# Patient Record
Sex: Male | Born: 1950
Health system: Southern US, Community
[De-identification: ages and names within clinical notes are randomized; demographics above are authoritative.]

## PROBLEM LIST (undated history)

## (undated) DIAGNOSIS — E119 Type 2 diabetes mellitus without complications: Secondary | ICD-10-CM

## (undated) DIAGNOSIS — I10 Essential (primary) hypertension: Secondary | ICD-10-CM

## (undated) DIAGNOSIS — N400 Enlarged prostate without lower urinary tract symptoms: Secondary | ICD-10-CM

## (undated) DIAGNOSIS — M1990 Unspecified osteoarthritis, unspecified site: Secondary | ICD-10-CM

## (undated) DIAGNOSIS — S129XXA Fracture of neck, unspecified, initial encounter: Secondary | ICD-10-CM

## (undated) DIAGNOSIS — N200 Calculus of kidney: Secondary | ICD-10-CM

## (undated) DIAGNOSIS — J45909 Unspecified asthma, uncomplicated: Secondary | ICD-10-CM

## (undated) DIAGNOSIS — Z87442 Personal history of urinary calculi: Secondary | ICD-10-CM

## (undated) HISTORY — DX: Unspecified osteoarthritis, unspecified site: M19.90

## (undated) HISTORY — DX: Essential (primary) hypertension: I10

## (undated) HISTORY — PX: ROTATOR CUFF REPAIR: SHX139

## (undated) HISTORY — DX: Type 2 diabetes mellitus without complications: E11.9

## (undated) HISTORY — DX: Benign prostatic hyperplasia without lower urinary tract symptoms: N40.0

## (undated) HISTORY — DX: Unspecified asthma, uncomplicated: J45.909

## (undated) HISTORY — DX: Calculus of kidney: N20.0

---
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
## 1973-04-21 HISTORY — PX: KNEE ARTHROSCOPY: SHX127

## 1990-04-21 DIAGNOSIS — S129XXA Fracture of neck, unspecified, initial encounter: Secondary | ICD-10-CM

## 1990-04-21 HISTORY — DX: Fracture of neck, unspecified, initial encounter: S12.9XXA

## 1998-09-04 ENCOUNTER — Ambulatory Visit (HOSPITAL_BASED_OUTPATIENT_CLINIC_OR_DEPARTMENT_OTHER): Admission: RE | Admit: 1998-09-04 | Discharge: 1998-09-04 | Payer: Self-pay | Admitting: Orthopedic Surgery

## 1999-05-30 ENCOUNTER — Ambulatory Visit (HOSPITAL_COMMUNITY): Admission: RE | Admit: 1999-05-30 | Discharge: 1999-05-30 | Payer: Self-pay | Admitting: Neurosurgery

## 1999-05-30 ENCOUNTER — Encounter: Payer: Self-pay | Admitting: Neurosurgery

## 1999-08-29 ENCOUNTER — Encounter: Admission: RE | Admit: 1999-08-29 | Discharge: 1999-08-29 | Payer: Self-pay | Admitting: Neurosurgery

## 1999-08-29 ENCOUNTER — Encounter: Payer: Self-pay | Admitting: Neurosurgery

## 1999-12-04 ENCOUNTER — Encounter: Admission: RE | Admit: 1999-12-04 | Discharge: 1999-12-04 | Payer: Self-pay | Admitting: Neurosurgery

## 1999-12-04 ENCOUNTER — Encounter: Payer: Self-pay | Admitting: Neurosurgery

## 2016-07-08 ENCOUNTER — Encounter: Payer: Self-pay | Admitting: Emergency Medicine

## 2016-07-08 ENCOUNTER — Emergency Department
Admission: EM | Admit: 2016-07-08 | Discharge: 2016-07-08 | Disposition: A | Discharge status: Home / Self Care | Payer: BLUE CROSS/BLUE SHIELD | Attending: Emergency Medicine | Admitting: Emergency Medicine

## 2016-07-08 ENCOUNTER — Emergency Department: Payer: BLUE CROSS/BLUE SHIELD

## 2016-07-08 DIAGNOSIS — N23 Unspecified renal colic: Secondary | ICD-10-CM | POA: Diagnosis not present

## 2016-07-08 DIAGNOSIS — R911 Solitary pulmonary nodule: Secondary | ICD-10-CM | POA: Insufficient documentation

## 2016-07-08 DIAGNOSIS — R109 Unspecified abdominal pain: Secondary | ICD-10-CM

## 2016-07-08 DIAGNOSIS — N400 Enlarged prostate without lower urinary tract symptoms: Secondary | ICD-10-CM

## 2016-07-08 DIAGNOSIS — E279 Disorder of adrenal gland, unspecified: Secondary | ICD-10-CM | POA: Diagnosis not present

## 2016-07-08 DIAGNOSIS — E278 Other specified disorders of adrenal gland: Secondary | ICD-10-CM

## 2016-07-08 LAB — BASIC METABOLIC PANEL
Anion gap: 11 (ref 5–15)
BUN: 13 mg/dL (ref 6–20)
CO2: 23 mmol/L (ref 22–32)
Calcium: 9.3 mg/dL (ref 8.9–10.3)
Chloride: 97 mmol/L — ABNORMAL LOW (ref 101–111)
Creatinine, Ser: 1.04 mg/dL (ref 0.61–1.24)
GFR calc Af Amer: >60 mL/min (ref >60)
GFR calc non Af Amer: >60 mL/min (ref >60)
Glucose, Bld: 355 mg/dL — ABNORMAL HIGH (ref 65–99)
Potassium: 3.7 mmol/L (ref 3.5–5.1)
Sodium: 131 mmol/L — ABNORMAL LOW (ref 135–145)

## 2016-07-08 LAB — URINALYSIS, COMPLETE (UACMP) WITH MICROSCOPIC
Bacteria, UA: NONE SEEN
Bilirubin Urine: NEGATIVE
KETONES UR: 20 mg/dL — AB
Leukocytes, UA: NEGATIVE
Nitrite: NEGATIVE
PH: 6 (ref 5.0–8.0)
Protein, ur: NEGATIVE mg/dL
SPECIFIC GRAVITY, URINE: 1.015 (ref 1.005–1.030)
Squamous Epithelial / LPF: NONE SEEN

## 2016-07-08 LAB — CBC
HCT: 45.6 % (ref 40.0–52.0)
Hemoglobin: 16.4 g/dL (ref 13.0–18.0)
MCH: 30.3 pg (ref 26.0–34.0)
MCHC: 36.1 g/dL — ABNORMAL HIGH (ref 32.0–36.0)
MCV: 84 fL (ref 80.0–100.0)
Platelets: 209 10*3/uL (ref 150–440)
RBC: 5.42 MIL/uL (ref 4.40–5.90)
RDW: 12.5 % (ref 11.5–14.5)
WBC: 9.2 10*3/uL (ref 3.8–10.6)

## 2016-07-08 MED ORDER — ONDANSETRON HCL 4 MG/2ML IJ SOLN
INTRAMUSCULAR | Status: AC
  Filled 2016-07-08: qty 2

## 2016-07-08 MED ORDER — SODIUM CHLORIDE 0.9 % IV BOLUS (SEPSIS)
1000.0000 mL | Freq: Once | INTRAVENOUS | Status: AC
  Administered 2016-07-08: 1000 mL via INTRAVENOUS

## 2016-07-08 MED ORDER — KETOROLAC TROMETHAMINE 30 MG/ML IJ SOLN
30.0000 mg | Freq: Once | INTRAMUSCULAR | Status: AC
Start: 2016-07-08 — End: 2016-07-08
  Administered 2016-07-08: 30 mg via INTRAVENOUS

## 2016-07-08 MED ORDER — KETOROLAC TROMETHAMINE 30 MG/ML IJ SOLN
INTRAMUSCULAR | Status: AC
  Filled 2016-07-08: qty 1

## 2016-07-08 MED ORDER — KETOROLAC TROMETHAMINE 10 MG PO TABS: 10.0000 mg | ORAL_TABLET | Freq: Three times a day (TID) | ORAL | 0 refills | Status: DC | PRN

## 2016-07-08 MED ORDER — SODIUM CHLORIDE 0.9 % IV BOLUS (SEPSIS): 1000.0000 mL | Freq: Once | INTRAVENOUS | Status: DC

## 2016-07-08 MED ORDER — ONDANSETRON HCL 4 MG/2ML IJ SOLN
4.0000 mg | Freq: Once | INTRAMUSCULAR | Status: AC
  Administered 2016-07-08: 4 mg via INTRAVENOUS

## 2016-07-08 NOTE — Discharge Instructions (Signed)
Please return to the emergency department if you develop severe pain, fever, inability to keep down fluids, or any other symptoms concerning to you.  Please also make a follow-up appointment with your primary care physician for the abnormal findings on your lung, adrenal gland, and prostate in your CT scan.

## 2016-07-08 NOTE — ED Provider Notes (Addendum)
[facility] Emergency Department Provider Note  ____________________________________________  Time seen: Approximately 10:56 AM  I have reviewed the triage vital signs and the nursing notes.   HISTORY  Chief Complaint Flank Pain    HPI Jonathan Cervantes is a 66 y.o. male with a history of renal colic presenting with recently diagnosed 6 mm left-sided renal stone failing symptomatic management at home. The patient reports that he began to have left flank pain 9 days ago it resolved after passing 2 small stones 4 days later. He was traveling over the weekend and had acute onset of left flank pain, and presented to a local emergency department where he was redness with a 6 mm stone and discharged with Zofran, hydrocodone, and Flomax. Since then, he has had worsening pain, vomiting despite medications. No known passage of stone, hematuria, fever.   Past Medical History:  Diagnosis Date  . Renal disorder     There are no active problems to display for this patient.   History reviewed. No pertinent surgical history.    Allergies Eggs or egg-derived products and Morphine and related  History reviewed. No pertinent family history.  Social History Social History  Substance Use Topics  . Smoking status: Never Smoker  . Smokeless tobacco: Never Used  . Alcohol use No    Review of Systems Constitutional: No fever/chills.No lightheadedness or syncope. Eyes: No visual changes. ENT: No sore throat. No congestion or rhinorrhea. Cardiovascular: Denies chest pain. Denies palpitations. Respiratory: Denies shortness of breath.  No cough. Gastrointestinal: Positive left flank pain. No abdominal pain.  Positive nausea, positive vomiting.  No diarrhea.  No constipation. Genitourinary: Negative for dysuria. No testicular, scrotal or penile pain.  No hematuria. Musculoskeletal: Positive for left flank pain Skin: Negative for rash. Neurological: Negative for headaches.  No focal numbness, tingling or weakness.   10-point ROS otherwise negative.  ____________________________________________   PHYSICAL EXAM:  VITAL SIGNS: ED Triage Vitals [07/08/16 1026]  Enc Vitals Group     BP (!) 187/110     Pulse Rate (!) 107     Resp 16     Temp 97.6 F (36.4 C)     Temp Source Oral     SpO2 99 %     Weight 168 lb (76.2 kg)     Height 5\' 11"  (1.803 m)     Head Circumference      Peak Flow      Pain Score 10     Pain Loc      Pain Edu?      Excl. in [place]?     Constitutional: Alert and oriented. Very uncomfortable appearing with waves of increasing distress but nontoxic. Answers questions appropriately. Eyes: Conjunctivae are normal.  EOMI. No scleral icterus. Head: Atraumatic. Nose: No congestion/rhinnorhea. Mouth/Throat: Mucous membranes are moist.  Neck: No stridor.  Supple.   Cardiovascular: Normal rate, regular rhythm. No murmurs, rubs or gallops.  Respiratory: Normal respiratory effort.  No accessory muscle use or retractions. Lungs CTAB.  No wheezes, rales or ronchi. Gastrointestinal: Positive tenderness to palpation in the left CVA. Soft, nontender and nondistended.  No guarding or rebound.  No peritoneal signs. Musculoskeletal: No LE edema. Neurologic:  A&Ox3.  Speech is clear.  Face and smile are symmetric.  EOMI.  Moves all extremities well. Skin:  Skin is warm, dry and intact. No rash noted. Psychiatric: Mood and affect are normal. Speech and behavior are normal.  Normal judgement.  ____________________________________________   LABS (all labs  ordered are listed, but only abnormal results are displayed)  Labs Reviewed  CBC - Abnormal; Notable for the following:       Result Value   MCHC 36.1 (*)    All other components within normal limits  BASIC METABOLIC PANEL - Abnormal; Notable for the following:    Sodium 131 (*)    Chloride 97 (*)    Glucose, Bld 355 (*)    All other components within normal limits  URINALYSIS, COMPLETE  (UACMP) WITH MICROSCOPIC - Abnormal; Notable for the following:    Color, Urine YELLOW (*)    APPearance CLEAR (*)    Glucose, UA >=500 (*)    Hgb urine dipstick MODERATE (*)    Ketones, ur 20 (*)    All other components within normal limits   ____________________________________________  EKG  Not indicated ____________________________________________  RADIOLOGY  Ct Renal Stone Study  Result Date: 07/08/2016 CLINICAL DATA:  Left flank pain for 1 week. Reported passage of renal stones. EXAM: CT ABDOMEN AND PELVIS WITHOUT CONTRAST TECHNIQUE: Multidetector CT imaging of the abdomen and pelvis was performed following the standard protocol without IV contrast. COMPARISON:  None. FINDINGS: Lower chest: Irregular 1.7 x 1.3 cm solid pulmonary nodule in the medial right lower lobe (series 4/image 12). Hepatobiliary: Diffuse hepatic steatosis. No liver mass. No definite morphologic changes of cirrhosis. Cholelithiasis. No gallbladder wall thickening or pericholecystic fluid. No biliary ductal dilatation. Pancreas: Normal, with no mass or duct dilation. Spleen: Normal size. No mass. Adrenals/Urinary Tract: Indeterminate 1.1 cm right adrenal nodule with density 30 HU (series 2/ image 25). No left adrenal nodules. Obstructing 7 mm left pelvic ureteral stone located approximately 6-7 cm above the left ureterovesical junction, with mild left hydroureteronephrosis. Additional punctate 1 mm nonobstructing lower left renal stone. No right renal stones. No right hydronephrosis. Normal caliber right ureter, with no right ureteral stones. No contour deforming renal mass. No bladder stones or definite bladder wall thickening. Stomach/Bowel: Grossly normal stomach. Normal caliber small bowel with no small bowel wall thickening. Normal appendix. Normal large bowel with no diverticulosis, large bowel wall thickening or pericolonic fat stranding. Vascular/Lymphatic: Atherosclerotic nonaneurysmal abdominal aorta. No  pathologically enlarged lymph nodes in the abdomen or pelvis. Reproductive: Markedly enlarged prostate. Prostate dimensions 6.6 x 5.4 x 6.5 cm (volume = 120 cm^3). Mass-effect on the bladder base by the enlarged median lobe of the prostate. Other: No pneumoperitoneum, ascites or focal fluid collection. Musculoskeletal: No aggressive appearing focal osseous lesions. Bilateral L5 pars defects with mild degenerative disc disease and minimal 3 mm anterolisthesis at L5-S1. IMPRESSION: 1. Obstructing 7 mm left pelvic ureteral stone with mild left hydroureteronephrosis. Additional punctate nonobstructing lower left renal stone. Markedly enlarged prostate. 2. Irregular solid 1.7 cm medial right lower lobe pulmonary nodule, lung malignancy not excluded. Consider one of the following in 3 months for both low-risk and high-risk individuals: (a) repeat chest CT, (b) follow-up PET-CT, or (c) tissue sampling. This recommendation follows the consensus statement: Guidelines for Management of Incidental Pulmonary Nodules Detected on CT Images: From the Fleischner Society 2017; Radiology 2017; 284:228-243. 3. Indeterminate 1.1 cm right adrenal nodule, which could also be assessed at PET-CT if performed for the right lower lobe pulmonary nodule. MRI abdomen without and with IV contrast is also an option for further evaluation of the right adrenal nodule. 4. Additional findings include aortic atherosclerosis, diffuse hepatic steatosis, cholelithiasis and bilateral L5 pars defects. Electronically Signed   By: Ilona Sorrel M.D.   On: 07/08/2016 11:45  ____________________________________________   PROCEDURES  Procedure(s) performed: None  Procedures  Critical Care performed: No ____________________________________________   INITIAL IMPRESSION / ASSESSMENT AND PLAN / ED COURSE  Pertinent labs & imaging results that were available during my care of the patient were reviewed by me and considered in my medical decision  making (see chart for details).  66 y.o. male with a history of renal colic and known 6 mm left-sided stone presenting with acute worsening of pain associated with nausea and vomiting despite attempted medications at home. Overall, the most likely etiology of the patient's symptoms is progressive movement of the 6 m stone. We'll get a repeat CT renal stone to evaluate his stone in the position of his stone. I'll also check his creatinine and look for signs of infection. He'll be treated symptomatically immediately and given intravenous fluids. Other causes for his pain are also possible but less likely. Plan reevaluation for final disposition.  ----------------------------------------- 12:07 PM on 07/08/2016 -----------------------------------------  At this time, the patient's symptoms have completely resolved. He is no longer nauseated, and his pain has completely resolved with a single dose of Toradol. His creatinine is normal, he does not have an elevation in his white blood cell count, and his urinalysis does not show infection. He does have a 7 mm stone that is still 6-7 cm above the UVJ. I'll plan to talk to urology for final disposition. Also where of the lung and adrenal masses that were found incidentally on his CAT scan as well as the large prostate, and will follow-up with his primary care physician.  ----------------------------------------- 12:37 PM on 07/08/2016 -----------------------------------------  I've spoken with Dr. Junious Silk, the urologist on-call, who recommends close outpatient follow-up. His office will call the patient to be seen this week. At this time, the patient will be discharged without any symptoms, and prescriptions for continued symptomatic treatment. Return precautions and follow-up instructions were discussed. ____________________________________________  FINAL CLINICAL IMPRESSION(S) / ED DIAGNOSES  Final diagnoses:  Flank pain  Enlarged prostate  Renal  colic on left side  Pulmonary nodule, right  Right adrenal mass ([place])         NEW MEDICATIONS STARTED DURING THIS VISIT:  New Prescriptions   No medications on file      Eula Listen, MD 07/08/16 1236    Eula Listen, MD 07/08/16 1237

## 2016-07-08 NOTE — ED Triage Notes (Signed)
Pt to ed with c/o left flank pain intermittently over the past week.  Hx of kidney stones.

## 2016-07-09 ENCOUNTER — Other Ambulatory Visit: Payer: Self-pay

## 2016-07-09 ENCOUNTER — Telehealth: Payer: Self-pay | Admitting: Urology

## 2016-07-09 DIAGNOSIS — N200 Calculus of kidney: Secondary | ICD-10-CM

## 2016-07-09 NOTE — Telephone Encounter (Signed)
-----   Message from Festus Aloe, MD sent at 07/08/2016  8:05 PM EDT ----- ED called to get f/u for pt - 7 mm left ureteral stone - see PA or MD with [facility] in 2-3 days

## 2016-07-09 NOTE — Telephone Encounter (Signed)
done

## 2016-07-10 ENCOUNTER — Encounter: Payer: Self-pay | Admitting: Urology

## 2016-07-10 ENCOUNTER — Ambulatory Visit (INDEPENDENT_AMBULATORY_CARE_PROVIDER_SITE_OTHER): Payer: BLUE CROSS/BLUE SHIELD | Admitting: Urology

## 2016-07-10 ENCOUNTER — Ambulatory Visit
Admission: RE | Admit: 2016-07-10 | Discharge: 2016-07-10 | Disposition: A | Discharge status: Home / Self Care | Payer: BLUE CROSS/BLUE SHIELD | Source: Ambulatory Visit | Attending: Urology | Admitting: Urology

## 2016-07-10 VITALS — BP 161/94 | HR 97 | Ht 71.0 in | Wt 179.8 lb

## 2016-07-10 DIAGNOSIS — R935 Abnormal findings on diagnostic imaging of other abdominal regions, including retroperitoneum: Secondary | ICD-10-CM | POA: Insufficient documentation

## 2016-07-10 DIAGNOSIS — N200 Calculus of kidney: Secondary | ICD-10-CM | POA: Diagnosis not present

## 2016-07-10 LAB — URINALYSIS, COMPLETE
Bilirubin, UA: NEGATIVE
Ketones, UA: NEGATIVE
Leukocytes, UA: NEGATIVE
NITRITE UA: NEGATIVE
PH UA: 6.5 (ref 5.0–7.5)
Protein, UA: NEGATIVE
Specific Gravity, UA: 1.01 (ref 1.005–1.030)
UUROB: 0.2 mg/dL (ref 0.2–1.0)

## 2016-07-10 LAB — MICROSCOPIC EXAMINATION
Bacteria, UA: NONE SEEN
Epithelial Cells (non renal): NONE SEEN /hpf (ref 0–10)
WBC UA: NONE SEEN /HPF (ref 0–?)

## 2016-07-10 MED ORDER — OXYCODONE-ACETAMINOPHEN 5-325 MG PO TABS: 1.0000 | ORAL_TABLET | ORAL | 0 refills | Status: DC | PRN

## 2016-07-10 NOTE — Progress Notes (Signed)
07/10/2016 4:21 PM   Jonathan Cervantes [birth year] [id number]  Referring provider: No referring provider defined for this encounter.  Chief Complaint  Patient presents with  . Nephrolithiasis    severe left side flank pain     HPI: The patient is a 66 year old gentleman with a past medical history of nephrolithiasis who presents after being diagnosed in the emergency department with a left distal 7 mm ureteral calculus.  The patient has been to the ER twice for this. Is currently in a tremendous amount of pain. He is on Toradol every 8 hours. He is also on Flomax and Zofran. He has not passed the stone. He has passed 5 previous stones all spontaneously. One was 9 mm. He has never had surgery for stones. He forgets what type the stone he has intrinsic composition. He denies fevers or chills at this time. He does have dry heaving. CT showed stone in the distal left ureter. It punctate stones on the left as well. Nonobstructing.   PMH: Past Medical History:  Diagnosis Date  . Renal disorder     Surgical History: No past surgical history on file.  Home Medications:  Allergies as of 07/10/2016      Reactions   Eggs Or Egg-derived Products Anaphylaxis   Morphine And Related Anaphylaxis      Medication List       Accurate as of 07/10/16  4:21 PM. Always use your most recent med list.          HYDROcodone-acetaminophen 5-325 MG tablet Commonly known as:  NORCO/VICODIN Take 1 tablet by mouth every 6 (six) hours as needed.   ketorolac 10 MG tablet Commonly known as:  TORADOL Take 1 tablet (10 mg total) by mouth every 8 (eight) hours as needed for moderate pain (with food).   ondansetron 4 MG disintegrating tablet Commonly known as:  ZOFRAN-ODT Take 1 tablet by mouth every 6 (six) hours as needed.   oxyCODONE-acetaminophen 5-325 MG tablet Commonly known as:  ROXICET Take 1-2 tablets by mouth every 4 (four) hours as needed for severe pain.   tamsulosin 0.4 MG Caps  capsule Commonly known as:  FLOMAX Take 1 capsule by mouth daily.       Allergies:  Allergies  Allergen Reactions  . Eggs Or Egg-Derived Products Anaphylaxis  . Morphine And Related Anaphylaxis    Family History: No family history on file.  Social History:  reports that he has never smoked. He has never used smokeless tobacco. He reports that he does not drink alcohol or use drugs.  ROS: UROLOGY Frequent Urination?: No Hard to postpone urination?: No Burning/pain with urination?: No Get up at night to urinate?: Yes Leakage of urine?: No Urine stream starts and stops?: No Trouble starting stream?: Yes Do you have to strain to urinate?: No Blood in urine?: No Urinary tract infection?: No Sexually transmitted disease?: No Injury to kidneys or bladder?: No Painful intercourse?: No Weak stream?: Yes Erection problems?: Yes Penile pain?: No  Gastrointestinal Nausea?: Yes Vomiting?: Yes Indigestion/heartburn?: No Diarrhea?: No Constipation?: Yes  Constitutional Fever: No Night sweats?: No Weight loss?: No Fatigue?: Yes  Skin Skin rash/lesions?: No Itching?: No  Eyes Blurred vision?: No Double vision?: No  Ears/Nose/Throat Sore throat?: No Sinus problems?: No  Hematologic/Lymphatic Swollen glands?: No Easy bruising?: No  Cardiovascular Leg swelling?: No Chest pain?: No  Respiratory Cough?: No Shortness of breath?: No  Endocrine Excessive thirst?: No  Musculoskeletal Back pain?: Yes Joint pain?: No  Neurological Headaches?:  No Dizziness?: No  Psychologic Depression?: No Anxiety?: No  Physical Exam: BP (!) 161/94   Pulse 97   Ht 5\' 11"  (1.803 m)   Wt 179 lb 12.8 oz (81.6 kg)   BMI 25.08 kg/m   Constitutional:  Alert and oriented, No acute distress. HEENT: [place] AT, moist mucus membranes.  Trachea midline, no masses. Cardiovascular: No clubbing, cyanosis, or edema. Respiratory: Normal respiratory effort, no increased work of  breathing. GI: Abdomen is soft, nontender, nondistended, no abdominal masses GU: Left CVA tenderness.  Skin: No rashes, bruises or suspicious lesions. Lymph: No cervical or inguinal adenopathy. Neurologic: Grossly intact, no focal deficits, moving all 4 extremities. Psychiatric: Normal mood and affect.  Laboratory Data: Lab Results  Component Value Date   WBC 9.2 07/08/2016   HGB 16.4 07/08/2016   HCT 45.6 07/08/2016   MCV 84.0 07/08/2016   PLT 209 07/08/2016    Lab Results  Component Value Date   CREATININE 1.04 07/08/2016    No results found for: PSA  No results found for: TESTOSTERONE  No results found for: HGBA1C  Urinalysis    Component Value Date/Time   COLORURINE YELLOW (A) 07/08/2016 1133   APPEARANCEUR CLEAR (A) 07/08/2016 1133   LABSPEC 1.015 07/08/2016 1133   PHURINE 6.0 07/08/2016 1133   GLUCOSEU >=500 (A) 07/08/2016 1133   HGBUR MODERATE (A) 07/08/2016 1133   BILIRUBINUR NEGATIVE 07/08/2016 1133   KETONESUR 20 (A) 07/08/2016 1133   PROTEINUR NEGATIVE 07/08/2016 1133   NITRITE NEGATIVE 07/08/2016 1133   LEUKOCYTESUR NEGATIVE 07/08/2016 1133    Pertinent Imaging: Reviewed as above.  Assessment & Plan:    1. Left ureteral stone The patient is quite uncomfortable at this time. He will continue his Toradol. I have also given him a prescription for Percocet 1-2 pills every 4 hours. We will have him scheduled tomorrow for cystoscopy, left ureteroscopy, laser lithotripsy, and stent placement. He understands the risks, benefits, and indications of this procedure. He understands the risks include but are not limited to bleeding, infection, iatrogenic injury, need for repeat procedures, need for ureteral stent, and postoperative stent discomfort. All questions were answered. We will have the patient scheduled for tomorrow morning. If his pain becomes uncontrolled and intolerable overnight, he will plan to present to the emergency room for IV pain control until the  scheduled surgery in the morning.  Nickie Retort, MD  [facility] [address] [place], [place] [address] [phone]

## 2016-07-11 ENCOUNTER — Ambulatory Visit: Payer: BLUE CROSS/BLUE SHIELD | Admitting: Anesthesiology

## 2016-07-11 ENCOUNTER — Encounter
Admission: RE | Disposition: A | Discharge status: Home / Self Care | Payer: Self-pay | Source: Ambulatory Visit | Attending: Urology

## 2016-07-11 ENCOUNTER — Ambulatory Visit
Admission: RE | Admit: 2016-07-11 | Discharge: 2016-07-11 | Disposition: A | Discharge status: Home / Self Care | Payer: BLUE CROSS/BLUE SHIELD | Source: Ambulatory Visit | Attending: Urology | Admitting: Urology

## 2016-07-11 ENCOUNTER — Encounter: Payer: Self-pay | Admitting: *Deleted

## 2016-07-11 DIAGNOSIS — N200 Calculus of kidney: Secondary | ICD-10-CM

## 2016-07-11 DIAGNOSIS — N132 Hydronephrosis with renal and ureteral calculous obstruction: Secondary | ICD-10-CM | POA: Diagnosis not present

## 2016-07-11 DIAGNOSIS — N138 Other obstructive and reflux uropathy: Secondary | ICD-10-CM | POA: Insufficient documentation

## 2016-07-11 DIAGNOSIS — N201 Calculus of ureter: Secondary | ICD-10-CM | POA: Diagnosis not present

## 2016-07-11 HISTORY — PX: CYSTOSCOPY WITH STENT PLACEMENT: SHX5790

## 2016-07-11 HISTORY — PX: URETEROSCOPY WITH HOLMIUM LASER LITHOTRIPSY: SHX6645

## 2016-07-11 HISTORY — DX: Fracture of neck, unspecified, initial encounter: S12.9XXA

## 2016-07-11 LAB — GLUCOSE, CAPILLARY
Glucose-Capillary: 272 mg/dL — ABNORMAL HIGH (ref 65–99)
Glucose-Capillary: 214 mg/dL — ABNORMAL HIGH (ref 65–99)

## 2016-07-11 SURGERY — URETEROSCOPY, WITH LITHOTRIPSY USING HOLMIUM LASER
Anesthesia: General | Site: Ureter | Laterality: Left | Wound class: Clean Contaminated

## 2016-07-11 MED ORDER — FENTANYL CITRATE (PF) 100 MCG/2ML IJ SOLN
INTRAMUSCULAR | Status: AC
  Filled 2016-07-11: qty 2

## 2016-07-11 MED ORDER — GLYCOPYRROLATE 0.2 MG/ML IJ SOLN
INTRAMUSCULAR | Status: DC | PRN
  Administered 2016-07-11: 0.2 mg via INTRAVENOUS

## 2016-07-11 MED ORDER — MIDAZOLAM HCL 2 MG/2ML IJ SOLN
INTRAMUSCULAR | Status: AC
  Filled 2016-07-11: qty 2

## 2016-07-11 MED ORDER — DEXAMETHASONE SODIUM PHOSPHATE 10 MG/ML IJ SOLN
INTRAMUSCULAR | Status: AC
  Filled 2016-07-11: qty 1

## 2016-07-11 MED ORDER — SODIUM CHLORIDE 0.9 % IV SOLN
INTRAVENOUS | Status: DC
  Administered 2016-07-11: 100 mL/h via INTRAVENOUS

## 2016-07-11 MED ORDER — CEFAZOLIN SODIUM-DEXTROSE 2-4 GM/100ML-% IV SOLN
2.0000 g | Freq: Once | INTRAVENOUS | Status: AC
  Administered 2016-07-11: 2 g via INTRAVENOUS

## 2016-07-11 MED ORDER — ONDANSETRON HCL 4 MG/2ML IJ SOLN
4.0000 mg | Freq: Once | INTRAMUSCULAR | Status: AC | PRN
  Administered 2016-07-11: 4 mg via INTRAVENOUS

## 2016-07-11 MED ORDER — PROPOFOL 10 MG/ML IV BOLUS
INTRAVENOUS | Status: AC
  Filled 2016-07-11: qty 20

## 2016-07-11 MED ORDER — FENTANYL CITRATE (PF) 100 MCG/2ML IJ SOLN
25.0000 ug | INTRAMUSCULAR | Status: AC | PRN
  Administered 2016-07-11 (×6): 25 ug via INTRAVENOUS

## 2016-07-11 MED ORDER — ONDANSETRON HCL 4 MG/2ML IJ SOLN
INTRAMUSCULAR | Status: AC
  Filled 2016-07-11: qty 2

## 2016-07-11 MED ORDER — LIDOCAINE HCL (CARDIAC) 20 MG/ML IV SOLN
INTRAVENOUS | Status: DC | PRN
  Administered 2016-07-11: 50 mg via INTRAVENOUS

## 2016-07-11 MED ORDER — FAMOTIDINE 20 MG PO TABS
ORAL_TABLET | ORAL | Status: AC
  Administered 2016-07-11: 20 mg via ORAL
  Filled 2016-07-11: qty 1

## 2016-07-11 MED ORDER — LIDOCAINE HCL (PF) 2 % IJ SOLN
INTRAMUSCULAR | Status: AC
  Filled 2016-07-11: qty 2

## 2016-07-11 MED ORDER — CEPHALEXIN 500 MG PO CAPS: 500.0000 mg | ORAL_CAPSULE | Freq: Three times a day (TID) | ORAL | 0 refills | Status: DC

## 2016-07-11 MED ORDER — FENTANYL CITRATE (PF) 100 MCG/2ML IJ SOLN
INTRAMUSCULAR | Status: AC
  Administered 2016-07-11: 25 ug via INTRAVENOUS
  Filled 2016-07-11: qty 2

## 2016-07-11 MED ORDER — GLYCOPYRROLATE 0.2 MG/ML IJ SOLN
INTRAMUSCULAR | Status: AC
  Filled 2016-07-11: qty 1

## 2016-07-11 MED ORDER — DEXAMETHASONE SODIUM PHOSPHATE 10 MG/ML IJ SOLN
INTRAMUSCULAR | Status: DC | PRN
  Administered 2016-07-11: 5 mg via INTRAVENOUS

## 2016-07-11 MED ORDER — SODIUM CHLORIDE 0.9 % IJ SOLN
INTRAMUSCULAR | Status: AC
  Filled 2016-07-11: qty 30

## 2016-07-11 MED ORDER — MIDAZOLAM HCL 2 MG/2ML IJ SOLN
INTRAMUSCULAR | Status: DC | PRN
  Administered 2016-07-11: 2 mg via INTRAVENOUS

## 2016-07-11 MED ORDER — FAMOTIDINE 20 MG PO TABS
20.0000 mg | ORAL_TABLET | Freq: Once | ORAL | Status: AC
  Administered 2016-07-11: 20 mg via ORAL

## 2016-07-11 MED ORDER — ONDANSETRON HCL 4 MG/2ML IJ SOLN
INTRAMUSCULAR | Status: AC
  Administered 2016-07-11: 4 mg via INTRAVENOUS
  Filled 2016-07-11: qty 2

## 2016-07-11 MED ORDER — ONDANSETRON HCL 4 MG/2ML IJ SOLN
INTRAMUSCULAR | Status: DC | PRN
  Administered 2016-07-11: 4 mg via INTRAVENOUS

## 2016-07-11 MED ORDER — PROPOFOL 10 MG/ML IV BOLUS
INTRAVENOUS | Status: DC | PRN
  Administered 2016-07-11: 200 mg via INTRAVENOUS

## 2016-07-11 MED ORDER — FENTANYL CITRATE (PF) 100 MCG/2ML IJ SOLN
INTRAMUSCULAR | Status: DC | PRN
  Administered 2016-07-11 (×2): 50 ug via INTRAVENOUS
  Administered 2016-07-11 (×2): 25 ug via INTRAVENOUS
  Administered 2016-07-11: 50 ug via INTRAVENOUS

## 2016-07-11 MED ORDER — IOTHALAMATE MEGLUMINE 43 % IV SOLN
INTRAVENOUS | Status: DC | PRN
  Administered 2016-07-11: 15 mL

## 2016-07-11 SURGICAL SUPPLY — 35 items
BACTOSHIELD CHG 4% 4OZ (MISCELLANEOUS) ×2
BAG URINE DRAINAGE (UROLOGICAL SUPPLIES) ×2 IMPLANT
BASKET ZERO TIP 1.9FR (BASKET) ×2 IMPLANT
BSKT STON RTRVL ZERO TP 1.9FR (BASKET) ×1
CATH FOL 2WAY LX 20X30 (CATHETERS) ×2 IMPLANT
CATH URETL 5X70 OPEN END (CATHETERS) ×3 IMPLANT
CNTNR SPEC 2.5X3XGRAD LEK (MISCELLANEOUS) ×1
CONT SPEC 4OZ STER OR WHT (MISCELLANEOUS) ×2
CONT SPEC 4OZ STRL OR WHT (MISCELLANEOUS) ×1
CONTAINER SPEC 2.5X3XGRAD LEK (MISCELLANEOUS) ×1 IMPLANT
GLOVE BIO SURGEON STRL SZ7 (GLOVE) ×3 IMPLANT
GLOVE BIO SURGEON STRL SZ7.5 (GLOVE) ×3 IMPLANT
GOWN STRL REUS W/ TWL LRG LVL4 (GOWN DISPOSABLE) ×1 IMPLANT
GOWN STRL REUS W/TWL LRG LVL4 (GOWN DISPOSABLE) ×3
GOWN STRL REUS W/TWL XL LVL3 (GOWN DISPOSABLE) ×3 IMPLANT
GUIDEWIRE SUPER STIFF (WIRE) IMPLANT
HOLDER FOLEY CATH W/STRAP (MISCELLANEOUS) ×2 IMPLANT
INTRODUCER DILATOR DOUBLE (INTRODUCER) ×3 IMPLANT
KIT RM TURNOVER CYSTO AR (KITS) ×3 IMPLANT
LASER HOLMIUM FIBER SU 365UM (MISCELLANEOUS) ×2 IMPLANT
PACK CYSTO AR (MISCELLANEOUS) ×3 IMPLANT
SCRUB CHG 4% DYNA-HEX 4OZ (MISCELLANEOUS) ×1 IMPLANT
SENSORWIRE 0.038 NOT ANGLED (WIRE) ×6
SET CYSTO W/LG BORE CLAMP LF (SET/KITS/TRAYS/PACK) ×3 IMPLANT
SHEATH URETERAL 13/15X36 1L (SHEATH) ×2 IMPLANT
SOL .9 NS 3000ML IRR  AL (IV SOLUTION) ×2
SOL .9 NS 3000ML IRR AL (IV SOLUTION) ×1
SOL .9 NS 3000ML IRR UROMATIC (IV SOLUTION) ×1 IMPLANT
STENT URET 6FRX24 CONTOUR (STENTS) IMPLANT
STENT URET 6FRX26 CONTOUR (STENTS) ×2 IMPLANT
SURGILUBE 2OZ TUBE FLIPTOP (MISCELLANEOUS) ×3 IMPLANT
SYR 10ML LL (SYRINGE) ×2 IMPLANT
SYRINGE IRR TOOMEY STRL 70CC (SYRINGE) ×3 IMPLANT
WATER STERILE IRR 1000ML POUR (IV SOLUTION) ×3 IMPLANT
WIRE SENSOR 0.038 NOT ANGLED (WIRE) ×2 IMPLANT

## 2016-07-11 NOTE — Anesthesia Preprocedure Evaluation (Signed)
Anesthesia Evaluation  Patient identified by MRN, date of birth, ID band Patient awake    Reviewed: Allergy & Precautions, NPO status , Patient's Chart, lab work & pertinent test results  History of Anesthesia Complications Negative for: history of anesthetic complications  Airway Mallampati: I       Dental   Pulmonary neg pulmonary ROS,           Cardiovascular negative cardio ROS       Neuro/Psych negative neurological ROS     GI/Hepatic negative GI ROS, Neg liver ROS,   Endo/Other  negative endocrine ROS  Renal/GU Renal disease (stones)     Musculoskeletal   Abdominal   Peds  Hematology negative hematology ROS (+)   Anesthesia Other Findings   Reproductive/Obstetrics                             Anesthesia Physical Anesthesia Plan  ASA: II  Anesthesia Plan: General   Post-op Pain Management:    Induction: Intravenous  Airway Management Planned: LMA  Additional Equipment:   Intra-op Plan:   Post-operative Plan:   Informed Consent: I have reviewed the patients History and Physical, chart, labs and discussed the procedure including the risks, benefits and alternatives for the proposed anesthesia with the patient or authorized representative who has indicated his/her understanding and acceptance.     Plan Discussed with:   Anesthesia Plan Comments:         Anesthesia Quick Evaluation

## 2016-07-11 NOTE — Anesthesia Postprocedure Evaluation (Signed)
Anesthesia Post Note  Patient: Jonathan Cervantes  Procedure(s) Performed: Procedure(s) (LRB): URETEROSCOPY WITH HOLMIUM LASER LITHOTRIPSY (Left) CYSTOSCOPY WITH STENT PLACEMENT (Left)  Patient location during evaluation: PACU Anesthesia Type: General Level of consciousness: awake and alert Pain management: pain level controlled Vital Signs Assessment: post-procedure vital signs reviewed and stable Respiratory status: spontaneous breathing and respiratory function stable Cardiovascular status: stable Anesthetic complications: no     Last Vitals:  Vitals:   07/11/16 1259 07/11/16 1504  BP: (!) 157/77 114/65  Pulse: 82 84  Resp: 15 10  Temp:  36.6 C    Last Pain:  Vitals:   07/11/16 1259  TempSrc:   PainSc: 2                  Mitali Shenefield K

## 2016-07-11 NOTE — OR Nursing (Signed)
Discussed with Dr. Ronelle Nigh the patients blood sugar of 276 and his elevated blood pressures.  Patient is not considered a diabetic and does not receive treatment for hypertension since he has had weight loss.  Dr. Ronelle Nigh does not want to treat either blood sugar or blood pressure at this time but will continue to monitor post operatively.

## 2016-07-11 NOTE — Interval H&P Note (Signed)
History and Physical Interval Note:  07/11/2016 10:28 AM  Jonathan Cervantes  has presented today for surgery, with the diagnosis of LEFT URETERAL STONE  The various methods of treatment have been discussed with the patient and family. After consideration of risks, benefits and other options for treatment, the patient has consented to  Procedure(s): URETEROSCOPY WITH HOLMIUM LASER LITHOTRIPSY (Left) CYSTOSCOPY WITH STENT PLACEMENT (Left) as a surgical intervention .  The patient's history has been reviewed, patient examined, no change in status, stable for surgery.  I have reviewed the patient's chart and labs.  Questions were answered to the patient's satisfaction.    RRR Lungs clear  Nickie Retort

## 2016-07-11 NOTE — Anesthesia Procedure Notes (Signed)
Procedure Name: LMA Insertion Performed by: Arinze Rivadeneira Pre-anesthesia Checklist: Patient identified, Patient being monitored, Timeout performed, Emergency Drugs available and Suction available Patient Re-evaluated:Patient Re-evaluated prior to inductionOxygen Delivery Method: Circle system utilized Preoxygenation: Pre-oxygenation with 100% oxygen Intubation Type: IV induction Ventilation: Mask ventilation without difficulty LMA: LMA inserted LMA Size: 4.0 Tube type: Oral Number of attempts: 1 Placement Confirmation: positive ETCO2 and breath sounds checked- equal and bilateral Tube secured with: Tape Dental Injury: Teeth and Oropharynx as per pre-operative assessment      

## 2016-07-11 NOTE — Transfer of Care (Signed)
Immediate Anesthesia Transfer of Care Note  Patient: Jonathan Cervantes  Procedure(s) Performed: Procedure(s): URETEROSCOPY WITH HOLMIUM LASER LITHOTRIPSY (Left) CYSTOSCOPY WITH STENT PLACEMENT (Left)  Patient Location: PACU  Anesthesia Type:General  Level of Consciousness: sedated  Airway & Oxygen Therapy: Patient Spontanous Breathing and Patient connected to face mask oxygen  Post-op Assessment: Report given to RN and Post -op Vital signs reviewed and stable  Post vital signs: Reviewed  Last Vitals:  Vitals:   07/11/16 1259 07/11/16 1504  BP: (!) 157/77 114/65  Pulse: 82 84  Resp: 15 10  Temp:      Last Pain:  Vitals:   07/11/16 1259  TempSrc:   PainSc: 2       Patients Stated Pain Goal: 1 (76/22/63 3354)  Complications: No apparent anesthesia complications

## 2016-07-11 NOTE — Discharge Instructions (Signed)

## 2016-07-11 NOTE — Anesthesia Post-op Follow-up Note (Cosign Needed)
Anesthesia QCDR form completed.        

## 2016-07-11 NOTE — H&P (View-Only) (Signed)
07/10/2016 4:21 PM   Jonathan Cervantes June 11, 1950 [id number]  Referring provider: No referring provider defined for this encounter.  Chief Complaint  Patient presents with  . Nephrolithiasis    severe left side flank pain     HPI: The patient is a 66 year old gentleman with a past medical history of nephrolithiasis who presents after being diagnosed in the emergency department with a left distal 7 mm ureteral calculus.  The patient has been to the ER twice for this. Is currently in a tremendous amount of pain. He is on Toradol every 8 hours. He is also on Flomax and Zofran. He has not passed the stone. He has passed 5 previous stones all spontaneously. One was 9 mm. He has never had surgery for stones. He forgets what type the stone he has intrinsic composition. He denies fevers or chills at this time. He does have dry heaving. CT showed stone in the distal left ureter. It punctate stones on the left as well. Nonobstructing.   PMH: Past Medical History:  Diagnosis Date  . Renal disorder     Surgical History: No past surgical history on file.  Home Medications:  Allergies as of 07/10/2016      Reactions   Eggs Or Egg-derived Products Anaphylaxis   Morphine And Related Anaphylaxis      Medication List       Accurate as of 07/10/16  4:21 PM. Always use your most recent med list.          HYDROcodone-acetaminophen 5-325 MG tablet Commonly known as:  NORCO/VICODIN Take 1 tablet by mouth every 6 (six) hours as needed.   ketorolac 10 MG tablet Commonly known as:  TORADOL Take 1 tablet (10 mg total) by mouth every 8 (eight) hours as needed for moderate pain (with food).   ondansetron 4 MG disintegrating tablet Commonly known as:  ZOFRAN-ODT Take 1 tablet by mouth every 6 (six) hours as needed.   oxyCODONE-acetaminophen 5-325 MG tablet Commonly known as:  ROXICET Take 1-2 tablets by mouth every 4 (four) hours as needed for severe pain.   tamsulosin 0.4 MG Caps  capsule Commonly known as:  FLOMAX Take 1 capsule by mouth daily.       Allergies:  Allergies  Allergen Reactions  . Eggs Or Egg-Derived Products Anaphylaxis  . Morphine And Related Anaphylaxis    Family History: No family history on file.  Social History:  reports that he has never smoked. He has never used smokeless tobacco. He reports that he does not drink alcohol or use drugs.  ROS: UROLOGY Frequent Urination?: No Hard to postpone urination?: No Burning/pain with urination?: No Get up at night to urinate?: Yes Leakage of urine?: No Urine stream starts and stops?: No Trouble starting stream?: Yes Do you have to strain to urinate?: No Blood in urine?: No Urinary tract infection?: No Sexually transmitted disease?: No Injury to kidneys or bladder?: No Painful intercourse?: No Weak stream?: Yes Erection problems?: Yes Penile pain?: No  Gastrointestinal Nausea?: Yes Vomiting?: Yes Indigestion/heartburn?: No Diarrhea?: No Constipation?: Yes  Constitutional Fever: No Night sweats?: No Weight loss?: No Fatigue?: Yes  Skin Skin rash/lesions?: No Itching?: No  Eyes Blurred vision?: No Double vision?: No  Ears/Nose/Throat Sore throat?: No Sinus problems?: No  Hematologic/Lymphatic Swollen glands?: No Easy bruising?: No  Cardiovascular Leg swelling?: No Chest pain?: No  Respiratory Cough?: No Shortness of breath?: No  Endocrine Excessive thirst?: No  Musculoskeletal Back pain?: Yes Joint pain?: No  Neurological Headaches?:  No Dizziness?: No  Psychologic Depression?: No Anxiety?: No  Physical Exam: BP (!) 161/94   Pulse 97   Ht 5\' 11"  (1.803 m)   Wt 179 lb 12.8 oz (81.6 kg)   BMI 25.08 kg/m   Constitutional:  Alert and oriented, No acute distress. HEENT: [name], moist mucus membranes.  Trachea midline, no masses. Cardiovascular: No clubbing, cyanosis, or edema. Respiratory: Normal respiratory effort, no increased work of  breathing. GI: Abdomen is soft, nontender, nondistended, no abdominal masses GU: Left CVA tenderness.  Skin: No rashes, bruises or suspicious lesions. Lymph: No cervical or inguinal adenopathy. Neurologic: Grossly intact, no focal deficits, moving all 4 extremities. Psychiatric: Normal mood and affect.  Laboratory Data: Lab Results  Component Value Date   WBC 9.2 07/08/2016   HGB 16.4 07/08/2016   HCT 45.6 07/08/2016   MCV 84.0 07/08/2016   PLT 209 07/08/2016    Lab Results  Component Value Date   CREATININE 1.04 07/08/2016    No results found for: PSA  No results found for: TESTOSTERONE  No results found for: HGBA1C  Urinalysis    Component Value Date/Time   COLORURINE YELLOW (A) 07/08/2016 1133   APPEARANCEUR CLEAR (A) 07/08/2016 1133   LABSPEC 1.015 07/08/2016 1133   PHURINE 6.0 07/08/2016 1133   GLUCOSEU >=500 (A) 07/08/2016 1133   HGBUR MODERATE (A) 07/08/2016 1133   BILIRUBINUR NEGATIVE 07/08/2016 1133   KETONESUR 20 (A) 07/08/2016 1133   PROTEINUR NEGATIVE 07/08/2016 1133   NITRITE NEGATIVE 07/08/2016 1133   LEUKOCYTESUR NEGATIVE 07/08/2016 1133    Pertinent Imaging: Reviewed as above.  Assessment & Plan:    1. Left ureteral stone The patient is quite uncomfortable at this time. He will continue his Toradol. I have also given him a prescription for Percocet 1-2 pills every 4 hours. We will have him scheduled tomorrow for cystoscopy, left ureteroscopy, laser lithotripsy, and stent placement. He understands the risks, benefits, and indications of this procedure. He understands the risks include but are not limited to bleeding, infection, iatrogenic injury, need for repeat procedures, need for ureteral stent, and postoperative stent discomfort. All questions were answered. We will have the patient scheduled for tomorrow morning. If his pain becomes uncontrolled and intolerable overnight, he will plan to present to the emergency room for IV pain control until the  scheduled surgery in the morning.  Nickie Retort, MD  [facility] [address], [place], [place] [address] [phone]

## 2016-07-11 NOTE — Op Note (Signed)
Date of procedure: 07/11/16  Preoperative diagnosis:  1. Left ureteral stone   Postoperative diagnosis:  1. Left ureteral stone   Procedure: 1. Cystoscopy 2. Left ureteroscopy 3. Laser lithotripsy 4. Stone basketing 5. Left retrograde pyelogram with interpretation 6. Left ureteral stent placement 6 French by 26 cm  Surgeon: Baruch Gouty, MD  Anesthesia: General  Complications: None  Intraoperative findings:  the patient a left ureteral stone that was broken a small pieces and removed in its entirety. Left retrograde program at the end of the procedure showed no residual filling defects. The patient also had significant visual obstruction with his prostate as well as a large median lobe.  EBL:  none  Specimens:  left ureteral stone to pathology  Drains:  6 French by 26 over left double-J ureteral stent and 20 French Foley catheter (Foley catheter will be removed prior to discharge home from the PACU)  Disposition: Stable to the postanesthesia care unit  Indication for procedure: The patient is a 66 y.o. male with a left ureteral stone that is 7 mm in size and has failed medical expulsive therapy.  After reviewing the management options for treatment, the patient elected to proceed with the above surgical procedure(s). We have discussed the potential benefits and risks of the procedure, side effects of the proposed treatment, the likelihood of the patient achieving the goals of the procedure, and any potential problems that might occur during the procedure or recuperation. Informed consent has been obtained.  Description of procedure: The patient was met in the preoperative area. All risks, benefits, and indications of the procedure were described in great detail. The patient consented to the procedure. Preoperative antibiotics were given. The patient was taken to the operative theater. General anesthesia was induced per the anesthesia service. The patient was then placed in the  dorsal lithotomy position and prepped and draped in the usual sterile fashion. A preoperative timeout was called.   A 21 French 30 cystoscope was inserted in the patient's bladder atraumatically per urethra. The patient was noted have a large obstructive prostate with a large median lobe. It was difficult to find his left ear orifice due to his median lobe and it was eventually located. There was minor oozing from the median lobe due to manipulation. A sensor wire was then placed up to the level of the left renal pelvis under fluoroscopy a semirigid ureteroscope was exchanged for cystoscope and inserted in the patient's left ureter. The stone was visualized in the mid to proximal ureter. It is broken into fragments. Unfortunately these fragments due to extensive hydronephrosis will up into the renal pelvis. The fragment in the ureter was removed the stone basket and sent to pathology. A second sensor wire was then placed into the left collecting system under fluoroscopy. Attempt was made to pass a flexible ureteroscope over this however his distal ureter was too tortuous and would not easily navigated past this. However, was able to pass a ureteral access sheath atraumatically into the proximal ureter. The remaining stone was then found in the right upper pole and removed. Pan nephroscopy revealed no further stones. The ureteral access sheath was removed under direct visualization to no further stones or second trauma. Left retropyelogram showed no residual filling defects. Over the remaining sensor wire with the aid of the cystoscope was 6 [place] by 26 over double-J ureteral stent was placed. A curl seen in the patient's left renal pelvis on fluoroscopy and the urinary bladder under direct visualization. At this point  it was again noted that the patient had oozing of varicoses and small veins on his median lobe due to manipulation. Decision was made to place a Foley catheter to Have this for one hour  postoperatively. This was placed. He was woke from anesthesia and transferred in stable condition to the post anesthesia care unit.  PlaThe patient will follow-up in one week for left ureteral stent removal  He'll need a renal OR sound in 1 month after that time to rule out hydronephrosis . Baruch Gouty, M.D.

## 2016-07-12 ENCOUNTER — Encounter: Payer: Self-pay | Admitting: Urology

## 2016-07-15 ENCOUNTER — Emergency Department: Payer: BLUE CROSS/BLUE SHIELD

## 2016-07-15 ENCOUNTER — Encounter: Payer: Self-pay | Admitting: *Deleted

## 2016-07-15 ENCOUNTER — Emergency Department
Admission: EM | Admit: 2016-07-15 | Discharge: 2016-07-15 | Disposition: A | Discharge status: Home / Self Care | Payer: BLUE CROSS/BLUE SHIELD | Attending: Emergency Medicine | Admitting: Emergency Medicine

## 2016-07-15 DIAGNOSIS — R1032 Left lower quadrant pain: Secondary | ICD-10-CM | POA: Diagnosis not present

## 2016-07-15 DIAGNOSIS — R300 Dysuria: Secondary | ICD-10-CM | POA: Diagnosis not present

## 2016-07-15 DIAGNOSIS — R319 Hematuria, unspecified: Secondary | ICD-10-CM | POA: Insufficient documentation

## 2016-07-15 DIAGNOSIS — Z79899 Other long term (current) drug therapy: Secondary | ICD-10-CM | POA: Insufficient documentation

## 2016-07-15 DIAGNOSIS — R52 Pain, unspecified: Secondary | ICD-10-CM

## 2016-07-15 LAB — CBC
HEMATOCRIT: 42.5 % (ref 40.0–52.0)
Hemoglobin: 15.1 g/dL (ref 13.0–18.0)
MCH: 30.2 pg (ref 26.0–34.0)
MCHC: 35.5 g/dL (ref 32.0–36.0)
MCV: 85 fL (ref 80.0–100.0)
PLATELETS: 259 10*3/uL (ref 150–440)
RBC: 5 MIL/uL (ref 4.40–5.90)
RDW: 12.6 % (ref 11.5–14.5)
WBC: 7.4 10*3/uL (ref 3.8–10.6)

## 2016-07-15 LAB — URINALYSIS, COMPLETE (UACMP) WITH MICROSCOPIC
BILIRUBIN URINE: NEGATIVE
Ketones, ur: 5 mg/dL — AB
NITRITE: NEGATIVE
PH: 5 (ref 5.0–8.0)
PROTEIN: 100 mg/dL — AB
Specific Gravity, Urine: 1.028 (ref 1.005–1.030)
Squamous Epithelial / LPF: NONE SEEN

## 2016-07-15 LAB — BASIC METABOLIC PANEL
Anion gap: 8 (ref 5–15)
BUN: 15 mg/dL (ref 6–20)
CHLORIDE: 105 mmol/L (ref 101–111)
CO2: 23 mmol/L (ref 22–32)
CREATININE: 0.77 mg/dL (ref 0.61–1.24)
Calcium: 9.5 mg/dL (ref 8.9–10.3)
Glucose, Bld: 330 mg/dL — ABNORMAL HIGH (ref 65–99)
POTASSIUM: 3.6 mmol/L (ref 3.5–5.1)
SODIUM: 136 mmol/L (ref 135–145)

## 2016-07-15 MED ORDER — KETOROLAC TROMETHAMINE 30 MG/ML IJ SOLN
30.0000 mg | Freq: Once | INTRAMUSCULAR | Status: AC
  Administered 2016-07-15: 30 mg via INTRAVENOUS

## 2016-07-15 MED ORDER — OXYCODONE-ACETAMINOPHEN 5-325 MG PO TABS: 1.0000 | ORAL_TABLET | ORAL | 0 refills | Status: DC | PRN

## 2016-07-15 MED ORDER — SODIUM CHLORIDE 0.9 % IV BOLUS (SEPSIS)
1000.0000 mL | Freq: Once | INTRAVENOUS | Status: AC
  Administered 2016-07-15: 1000 mL via INTRAVENOUS

## 2016-07-15 MED ORDER — ONDANSETRON HCL 4 MG/2ML IJ SOLN
INTRAMUSCULAR | Status: AC
  Administered 2016-07-15: 4 mg via INTRAVENOUS
  Filled 2016-07-15: qty 2

## 2016-07-15 MED ORDER — KETOROLAC TROMETHAMINE 30 MG/ML IJ SOLN
INTRAMUSCULAR | Status: AC
  Administered 2016-07-15: 30 mg via INTRAVENOUS
  Filled 2016-07-15: qty 1

## 2016-07-15 MED ORDER — ONDANSETRON HCL 4 MG/2ML IJ SOLN
4.0000 mg | Freq: Once | INTRAMUSCULAR | Status: AC
  Administered 2016-07-15: 4 mg via INTRAVENOUS
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
## 2016-07-15 NOTE — Discharge Instructions (Signed)
Please continue drinking plenty of fluids. Continue to use the Flomax. Take the Percocet 1 or 2 pills 4 times a day as needed. Return for worse pain or bleeding follow-up with your allergist as planned.

## 2016-07-15 NOTE — ED Triage Notes (Signed)
Pt to triage via wheelchair. Pt reports passing clots of blood in urine.  Pt has kidney surgery last week at [facility].  Pt has left flank pain.  Pt has nausea.  Pt alert.

## 2016-07-17 ENCOUNTER — Ambulatory Visit (INDEPENDENT_AMBULATORY_CARE_PROVIDER_SITE_OTHER): Payer: BLUE CROSS/BLUE SHIELD | Admitting: Urology

## 2016-07-17 ENCOUNTER — Encounter: Payer: Self-pay | Admitting: Urology

## 2016-07-17 VITALS — BP 158/72 | HR 106 | Ht 71.0 in | Wt 175.5 lb

## 2016-07-17 DIAGNOSIS — N200 Calculus of kidney: Secondary | ICD-10-CM

## 2016-07-17 LAB — URINALYSIS, COMPLETE
Bilirubin, UA: NEGATIVE
Ketones, UA: NEGATIVE
NITRITE UA: NEGATIVE
PH UA: 5.5 (ref 5.0–7.5)
Specific Gravity, UA: 1.02 (ref 1.005–1.030)
UUROB: 0.2 mg/dL (ref 0.2–1.0)

## 2016-07-17 LAB — MICROSCOPIC EXAMINATION

## 2016-07-17 MED ORDER — CIPROFLOXACIN HCL 500 MG PO TABS
500.0000 mg | ORAL_TABLET | Freq: Once | ORAL | Status: AC
  Administered 2016-07-17: 500 mg via ORAL

## 2016-07-17 MED ORDER — LIDOCAINE HCL 2 % EX GEL
1.0000 "application " | Freq: Once | CUTANEOUS | Status: AC
  Administered 2016-07-17: 1 via URETHRAL

## 2016-07-17 NOTE — Progress Notes (Signed)
   07/17/16  CC:  Chief Complaint  Patient presents with  . Cysto Stent Removal    HPI:  There were no vitals taken for this visit. NED. A&Ox3.   No respiratory distress   Abd soft, NT, ND Normal phallus with bilateral descended testicles  Cystoscopy Procedure Note  Patient identification was confirmed, informed consent was obtained, and patient was prepped using Betadine solution.  Lidocaine jelly was administered per urethral meatus.    Preoperative abx where received prior to procedure.     Pre-Procedure: - Inspection reveals a normal caliber ureteral meatus.  Procedure: The flexible cystoscope was introduced without difficulty - No urethral strictures/lesions are present. - The patient's bladder neck and median lobe were very friable making visualization difficult. Furthermore due to the large median lobe, the left ureteral stent was difficult to visualize as it was mostly posterior to it. After approximately 10 minutes, the procedure was aborted due to friable tissue obstructing the vision, the patient's inability to continue to tolerate the procedure, and the inability to grasp the left ureteral stent.   Post-Procedure: - Patient tolerated the procedure well  Assessment/ Plan:  1. Left ureteral stone -We'll plan for cystoscopy with left ureteral stent removal in the OR due to the patient's complex anatomy and friable prostate tissue.  2. BPH -Significant visual obstruction was noted with large median lobe during recent cystoscopy. Will discuss patient's symptoms in greater detail at his follow-up appointment.

## 2016-07-21 ENCOUNTER — Telehealth: Payer: Self-pay | Admitting: Radiology

## 2016-07-21 ENCOUNTER — Other Ambulatory Visit: Payer: Self-pay | Admitting: Radiology

## 2016-07-21 DIAGNOSIS — Z960 Presence of urogenital implants: Secondary | ICD-10-CM

## 2016-07-21 DIAGNOSIS — N201 Calculus of ureter: Secondary | ICD-10-CM

## 2016-07-21 LAB — CULTURE, URINE COMPREHENSIVE

## 2016-07-21 NOTE — Telephone Encounter (Signed)
Notified pt of surgery date & arrival time to SDS. Advised pt to hold toradol, NSAIDS, ASA products beginning now & to arrive npo after mn. Pt voices understanding & had no questions.

## 2016-07-23 ENCOUNTER — Other Ambulatory Visit: Payer: Self-pay | Admitting: Radiology

## 2016-07-23 LAB — STONE ANALYSIS
Ca Oxalate,Monohydr.: 97 %
Ca phos cry stone ql IR: 3 %
Stone Weight KSTONE: 73.5 mg

## 2016-07-24 ENCOUNTER — Encounter
Admission: RE | Disposition: A | Discharge status: Home / Self Care | Payer: Self-pay | Source: Ambulatory Visit | Attending: Urology

## 2016-07-24 ENCOUNTER — Ambulatory Visit: Payer: BLUE CROSS/BLUE SHIELD | Admitting: Certified Registered Nurse Anesthetist

## 2016-07-24 ENCOUNTER — Encounter: Payer: Self-pay | Admitting: *Deleted

## 2016-07-24 ENCOUNTER — Ambulatory Visit
Admission: RE | Admit: 2016-07-24 | Discharge: 2016-07-24 | Disposition: A | Discharge status: Home / Self Care | Payer: BLUE CROSS/BLUE SHIELD | Source: Ambulatory Visit | Attending: Urology | Admitting: Urology

## 2016-07-24 DIAGNOSIS — I10 Essential (primary) hypertension: Secondary | ICD-10-CM | POA: Insufficient documentation

## 2016-07-24 DIAGNOSIS — Z885 Allergy status to narcotic agent status: Secondary | ICD-10-CM | POA: Diagnosis not present

## 2016-07-24 DIAGNOSIS — Z91012 Allergy to eggs: Secondary | ICD-10-CM | POA: Diagnosis not present

## 2016-07-24 DIAGNOSIS — Z960 Presence of urogenital implants: Secondary | ICD-10-CM

## 2016-07-24 DIAGNOSIS — Z466 Encounter for fitting and adjustment of urinary device: Secondary | ICD-10-CM | POA: Diagnosis not present

## 2016-07-24 DIAGNOSIS — N200 Calculus of kidney: Secondary | ICD-10-CM | POA: Diagnosis present

## 2016-07-24 HISTORY — PX: CYSTOSCOPY W/ URETERAL STENT REMOVAL: SHX1430

## 2016-07-24 LAB — GLUCOSE, CAPILLARY
GLUCOSE-CAPILLARY: 276 mg/dL — AB (ref 65–99)
Glucose-Capillary: 263 mg/dL — ABNORMAL HIGH (ref 65–99)

## 2016-07-24 SURGERY — REMOVAL, STENT, URETER, CYSTOSCOPIC
Anesthesia: General | Laterality: Left

## 2016-07-24 MED ORDER — PROPOFOL 500 MG/50ML IV EMUL
INTRAVENOUS | Status: AC
  Filled 2016-07-24: qty 50

## 2016-07-24 MED ORDER — FENTANYL CITRATE (PF) 100 MCG/2ML IJ SOLN: 25.0000 ug | INTRAMUSCULAR | Status: DC | PRN

## 2016-07-24 MED ORDER — FENTANYL CITRATE (PF) 100 MCG/2ML IJ SOLN
INTRAMUSCULAR | Status: DC | PRN
  Administered 2016-07-24 (×2): 50 ug via INTRAVENOUS

## 2016-07-24 MED ORDER — DEXAMETHASONE SODIUM PHOSPHATE 10 MG/ML IJ SOLN
INTRAMUSCULAR | Status: AC
  Filled 2016-07-24: qty 1

## 2016-07-24 MED ORDER — LIDOCAINE HCL (PF) 2 % IJ SOLN
INTRAMUSCULAR | Status: AC
  Filled 2016-07-24: qty 2

## 2016-07-24 MED ORDER — FAMOTIDINE 20 MG PO TABS
ORAL_TABLET | ORAL | Status: AC
  Administered 2016-07-24: 20 mg via ORAL
  Filled 2016-07-24: qty 1

## 2016-07-24 MED ORDER — GLYCOPYRROLATE 0.2 MG/ML IJ SOLN
INTRAMUSCULAR | Status: AC
  Filled 2016-07-24: qty 1

## 2016-07-24 MED ORDER — PROPOFOL 10 MG/ML IV BOLUS
INTRAVENOUS | Status: DC | PRN
  Administered 2016-07-24 (×2): 40 mg via INTRAVENOUS
  Administered 2016-07-24: 30 mg via INTRAVENOUS
  Administered 2016-07-24 (×2): 10 mg via INTRAVENOUS
  Administered 2016-07-24: 20 mg via INTRAVENOUS

## 2016-07-24 MED ORDER — CEFAZOLIN SODIUM-DEXTROSE 2-4 GM/100ML-% IV SOLN
INTRAVENOUS | Status: AC
  Administered 2016-07-24: 2 g via INTRAVENOUS
  Filled 2016-07-24: qty 100

## 2016-07-24 MED ORDER — FAMOTIDINE 20 MG PO TABS
20.0000 mg | ORAL_TABLET | Freq: Once | ORAL | Status: AC
  Administered 2016-07-24: 20 mg via ORAL

## 2016-07-24 MED ORDER — ONDANSETRON HCL 4 MG/2ML IJ SOLN: 4.0000 mg | Freq: Once | INTRAMUSCULAR | Status: DC | PRN

## 2016-07-24 MED ORDER — CEFAZOLIN SODIUM-DEXTROSE 2-4 GM/100ML-% IV SOLN
2.0000 g | INTRAVENOUS | Status: AC
  Administered 2016-07-24: 2 g via INTRAVENOUS

## 2016-07-24 MED ORDER — LIDOCAINE HCL (CARDIAC) 20 MG/ML IV SOLN
INTRAVENOUS | Status: DC | PRN
  Administered 2016-07-24: 50 mg via INTRAVENOUS

## 2016-07-24 MED ORDER — FENTANYL CITRATE (PF) 100 MCG/2ML IJ SOLN
INTRAMUSCULAR | Status: AC
  Filled 2016-07-24: qty 2

## 2016-07-24 MED ORDER — GLYCOPYRROLATE 0.2 MG/ML IJ SOLN
INTRAMUSCULAR | Status: DC | PRN
  Administered 2016-07-24: 0.2 mg via INTRAVENOUS

## 2016-07-24 MED ORDER — ONDANSETRON HCL 4 MG/2ML IJ SOLN
INTRAMUSCULAR | Status: AC
  Filled 2016-07-24: qty 2

## 2016-07-24 MED ORDER — SODIUM CHLORIDE 0.9 % IV SOLN
INTRAVENOUS | Status: DC
  Administered 2016-07-24: 09:00:00 via INTRAVENOUS

## 2016-07-24 SURGICAL SUPPLY — 18 items
BACTOSHIELD CHG 4% 4OZ (MISCELLANEOUS) ×2
GLOVE BIO SURGEON STRL SZ7.5 (GLOVE) ×3 IMPLANT
GOWN STRL REUS W/ TWL LRG LVL4 (GOWN DISPOSABLE) ×1 IMPLANT
GOWN STRL REUS W/TWL LRG LVL4 (GOWN DISPOSABLE) ×3
GOWN STRL REUS W/TWL XL LVL3 (GOWN DISPOSABLE) ×3 IMPLANT
KIT RM TURNOVER CYSTO AR (KITS) ×3 IMPLANT
PACK CYSTO AR (MISCELLANEOUS) ×3 IMPLANT
SCRUB CHG 4% DYNA-HEX 4OZ (MISCELLANEOUS) ×1 IMPLANT
SENSORWIRE 0.038 NOT ANGLED (WIRE) ×3
SET CYSTO W/LG BORE CLAMP LF (SET/KITS/TRAYS/PACK) ×3 IMPLANT
SOL .9 NS 3000ML IRR  AL (IV SOLUTION) ×2
SOL .9 NS 3000ML IRR AL (IV SOLUTION) ×1
SOL .9 NS 3000ML IRR UROMATIC (IV SOLUTION) ×1 IMPLANT
STENT URET 6FRX24 CONTOUR (STENTS) IMPLANT
STENT URET 6FRX26 CONTOUR (STENTS) IMPLANT
SURGILUBE 2OZ TUBE FLIPTOP (MISCELLANEOUS) ×3 IMPLANT
WATER STERILE IRR 1000ML POUR (IV SOLUTION) ×3 IMPLANT
WIRE SENSOR 0.038 NOT ANGLED (WIRE) ×1 IMPLANT

## 2016-07-24 NOTE — Anesthesia Post-op Follow-up Note (Cosign Needed)
Anesthesia QCDR form completed.        

## 2016-07-24 NOTE — Anesthesia Procedure Notes (Signed)
Date/Time: 07/24/2016 9:52 AM Performed by: Johnna Acosta Pre-anesthesia Checklist: Emergency Drugs available, Patient identified, Suction available, Patient being monitored and Timeout performed Patient Re-evaluated:Patient Re-evaluated prior to inductionOxygen Delivery Method: Simple face mask

## 2016-07-24 NOTE — Anesthesia Postprocedure Evaluation (Signed)
Anesthesia Post Note  Patient: Jonathan Cervantes  Procedure(s) Performed: Procedure(s) (LRB): CYSTOSCOPY WITH STENT REMOVAL (Left)  Patient location during evaluation: PACU Anesthesia Type: General Level of consciousness: awake and alert Pain management: pain level controlled Vital Signs Assessment: post-procedure vital signs reviewed and stable Respiratory status: spontaneous breathing, nonlabored ventilation, respiratory function stable and patient connected to nasal cannula oxygen Cardiovascular status: blood pressure returned to baseline and stable Postop Assessment: no signs of nausea or vomiting Anesthetic complications: no     Last Vitals:  Vitals:   07/24/16 1055 07/24/16 1107  BP: (!) 155/89 (!) 151/80  Pulse: 70   Resp: 16   Temp: 36.6 C     Last Pain:  Vitals:   07/24/16 1055  TempSrc:   PainSc: 0-No pain                 Charvi Gammage S

## 2016-07-24 NOTE — H&P (View-Only) (Signed)
07/10/2016 4:21 PM   Jonathan Cervantes [birth year] [id number]  Referring provider: No referring provider defined for this encounter.  Chief Complaint  Patient presents with  . Nephrolithiasis    severe left side flank pain     HPI: The patient is a 66 year old gentleman with a past medical history of nephrolithiasis who presents after being diagnosed in the emergency department with a left distal 7 mm ureteral calculus.  The patient has been to the ER twice for this. Is currently in a tremendous amount of pain. He is on Toradol every 8 hours. He is also on Flomax and Zofran. He has not passed the stone. He has passed 5 previous stones all spontaneously. One was 9 mm. He has never had surgery for stones. He forgets what type the stone he has intrinsic composition. He denies fevers or chills at this time. He does have dry heaving. CT showed stone in the distal left ureter. It punctate stones on the left as well. Nonobstructing.   PMH: Past Medical History:  Diagnosis Date  . Renal disorder     Surgical History: No past surgical history on file.  Home Medications:  Allergies as of 07/10/2016      Reactions   Eggs Or Egg-derived Products Anaphylaxis   Morphine And Related Anaphylaxis      Medication List       Accurate as of 07/10/16  4:21 PM. Always use your most recent med list.          HYDROcodone-acetaminophen 5-325 MG tablet Commonly known as:  NORCO/VICODIN Take 1 tablet by mouth every 6 (six) hours as needed.   ketorolac 10 MG tablet Commonly known as:  TORADOL Take 1 tablet (10 mg total) by mouth every 8 (eight) hours as needed for moderate pain (with food).   ondansetron 4 MG disintegrating tablet Commonly known as:  ZOFRAN-ODT Take 1 tablet by mouth every 6 (six) hours as needed.   oxyCODONE-acetaminophen 5-325 MG tablet Commonly known as:  ROXICET Take 1-2 tablets by mouth every 4 (four) hours as needed for severe pain.   tamsulosin 0.4 MG Caps  capsule Commonly known as:  FLOMAX Take 1 capsule by mouth daily.       Allergies:  Allergies  Allergen Reactions  . Eggs Or Egg-Derived Products Anaphylaxis  . Morphine And Related Anaphylaxis    Family History: No family history on file.  Social History:  reports that he has never smoked. He has never used smokeless tobacco. He reports that he does not drink alcohol or use drugs.  ROS: UROLOGY Frequent Urination?: No Hard to postpone urination?: No Burning/pain with urination?: No Get up at night to urinate?: Yes Leakage of urine?: No Urine stream starts and stops?: No Trouble starting stream?: Yes Do you have to strain to urinate?: No Blood in urine?: No Urinary tract infection?: No Sexually transmitted disease?: No Injury to kidneys or bladder?: No Painful intercourse?: No Weak stream?: Yes Erection problems?: Yes Penile pain?: No  Gastrointestinal Nausea?: Yes Vomiting?: Yes Indigestion/heartburn?: No Diarrhea?: No Constipation?: Yes  Constitutional Fever: No Night sweats?: No Weight loss?: No Fatigue?: Yes  Skin Skin rash/lesions?: No Itching?: No  Eyes Blurred vision?: No Double vision?: No  Ears/Nose/Throat Sore throat?: No Sinus problems?: No  Hematologic/Lymphatic Swollen glands?: No Easy bruising?: No  Cardiovascular Leg swelling?: No Chest pain?: No  Respiratory Cough?: No Shortness of breath?: No  Endocrine Excessive thirst?: No  Musculoskeletal Back pain?: Yes Joint pain?: No  Neurological Headaches?:  No Dizziness?: No  Psychologic Depression?: No Anxiety?: No  Physical Exam: BP (!) 161/94   Pulse 97   Ht 5\' 11"  (1.803 m)   Wt 179 lb 12.8 oz (81.6 kg)   BMI 25.08 kg/m   Constitutional:  Alert and oriented, No acute distress. HEENT: [place] AT, moist mucus membranes.  Trachea midline, no masses. Cardiovascular: No clubbing, cyanosis, or edema. Respiratory: Normal respiratory effort, no increased work of  breathing. GI: Abdomen is soft, nontender, nondistended, no abdominal masses GU: Left CVA tenderness.  Skin: No rashes, bruises or suspicious lesions. Lymph: No cervical or inguinal adenopathy. Neurologic: Grossly intact, no focal deficits, moving all 4 extremities. Psychiatric: Normal mood and affect.  Laboratory Data: Lab Results  Component Value Date   WBC 9.2 07/08/2016   HGB 16.4 07/08/2016   HCT 45.6 07/08/2016   MCV 84.0 07/08/2016   PLT 209 07/08/2016    Lab Results  Component Value Date   CREATININE 1.04 07/08/2016    No results found for: PSA  No results found for: TESTOSTERONE  No results found for: HGBA1C  Urinalysis    Component Value Date/Time   COLORURINE YELLOW (A) 07/08/2016 1133   APPEARANCEUR CLEAR (A) 07/08/2016 1133   LABSPEC 1.015 07/08/2016 1133   PHURINE 6.0 07/08/2016 1133   GLUCOSEU >=500 (A) 07/08/2016 1133   HGBUR MODERATE (A) 07/08/2016 1133   BILIRUBINUR NEGATIVE 07/08/2016 1133   KETONESUR 20 (A) 07/08/2016 1133   PROTEINUR NEGATIVE 07/08/2016 1133   NITRITE NEGATIVE 07/08/2016 1133   LEUKOCYTESUR NEGATIVE 07/08/2016 1133    Pertinent Imaging: Reviewed as above.  Assessment & Plan:    1. Left ureteral stone The patient is quite uncomfortable at this time. He will continue his Toradol. I have also given him a prescription for Percocet 1-2 pills every 4 hours. We will have him scheduled tomorrow for cystoscopy, left ureteroscopy, laser lithotripsy, and stent placement. He understands the risks, benefits, and indications of this procedure. He understands the risks include but are not limited to bleeding, infection, iatrogenic injury, need for repeat procedures, need for ureteral stent, and postoperative stent discomfort. All questions were answered. We will have the patient scheduled for tomorrow morning. If his pain becomes uncontrolled and intolerable overnight, he will plan to present to the emergency room for IV pain control until the  scheduled surgery in the morning.  Nickie Retort, MD  [facility] [address], [place],  [address] [phone]

## 2016-07-24 NOTE — Transfer of Care (Signed)
Immediate Anesthesia Transfer of Care Note  Patient: Jonathan Cervantes  Procedure(s) Performed: Procedure(s): CYSTOSCOPY WITH STENT REMOVAL (Left)  Patient Location: PACU  Anesthesia Type:General  Level of Consciousness: awake, alert  and oriented  Airway & Oxygen Therapy: Patient Spontanous Breathing and Patient connected to face mask oxygen  Post-op Assessment: Report given to RN and Post -op Vital signs reviewed and stable  Post vital signs: Reviewed and stable  Last Vitals:  Vitals:   07/24/16 0841  BP: (!) 165/90  Pulse: 93  Resp: 18  Temp: 36.8 C    Last Pain:  Vitals:   07/24/16 0841  TempSrc: Oral  PainSc: 0-No pain         Complications: No apparent anesthesia complications

## 2016-07-24 NOTE — Anesthesia Preprocedure Evaluation (Signed)
Anesthesia Evaluation  Patient identified by MRN, date of birth, ID band Patient awake    Reviewed: Allergy & Precautions, NPO status , Patient's Chart, lab work & pertinent test results, reviewed documented beta blocker date and time   Airway Mallampati: II  TM Distance: >3 FB     Dental  (+) Chipped   Pulmonary           Cardiovascular      Neuro/Psych    GI/Hepatic   Endo/Other    Renal/GU Renal disease     Musculoskeletal   Abdominal   Peds  Hematology   Anesthesia Other Findings Hypertensive this am.  Reproductive/Obstetrics                             Anesthesia Physical Anesthesia Plan  ASA: III  Anesthesia Plan: General   Post-op Pain Management:    Induction: Intravenous  Airway Management Planned: LMA  Additional Equipment:   Intra-op Plan:   Post-operative Plan:   Informed Consent: I have reviewed the patients History and Physical, chart, labs and discussed the procedure including the risks, benefits and alternatives for the proposed anesthesia with the patient or authorized representative who has indicated his/her understanding and acceptance.     Plan Discussed with: CRNA  Anesthesia Plan Comments:         Anesthesia Quick Evaluation

## 2016-07-24 NOTE — OR Nursing (Signed)
Dr. Marcello Moores notified of patient's blood sugar today. No further orders given. Patient reports his sugars have been running high since he has had the kidney stone and that his medical doctor is going to start treating after this procedure today if it remains high.

## 2016-07-24 NOTE — Op Note (Signed)
Date of procedure: 07/24/16  Preoperative diagnosis:  1. Retained left ureteral stent   Postoperative diagnosis:  1. Retained left ureteral stent   Procedure: 1. Cystoscopy 2. Left ureteral stent removal  Surgeon: Brian Budzyn, MD  Anesthesia: TIVA  Complications: None  Intraoperative findings: The patient's left ureteral stent was removed intact per meatus with flexible graspers  EBL: None  Specimens: None  Drains: None  Disposition: Stable to the postanesthesia care unit  Indication for procedure: The patient is a 65 y.o. male with history of left ureteroscopy status post left ureteral stent placement. His left ureter stent was unable to removed in the office due to his hypervascular friable large median lobe limiting visualization. Once today for left ureteral stent removal under anesthesia.  After reviewing the management options for treatment, the patient elected to proceed with the above surgical procedure(s). We have discussed the potential benefits and risks of the procedure, side effects of the proposed treatment, the likelihood of the patient achieving the goals of the procedure, and any potential problems that might occur during the procedure or recuperation. Informed consent has been obtained.  Description of procedure: The patient was met in the preoperative area. All risks, benefits, and indications of the procedure were described in great detail. The patient consented to the procedure. Preoperative antibiotics were given. The patient was taken to the operative theater. General anesthesia was induced per the anesthesia service. The patient was then placed in the dorsal lithotomy position and prepped and draped in the usual sterile fashion. A preoperative timeout was called.    A 21 French 30 cystoscope was inserted into the patient's bladder per urethra atraumatically. The left ureteral stent was grasped with flexible graspers and removed intact per meatus. The  patient was woke from anesthesia and transferred in stable condition to the postanesthesia care unit.  Plan: The patient will follow-up in one month with a renal ultrasound prior to rule out iatrogenic hydronephrosis. We will also discuss his urinary issues at that time as well as a stone analysis.  Brian Budzyn, M.D.   

## 2016-07-24 NOTE — Interval H&P Note (Signed)
History and Physical Interval Note:  07/24/2016 9:25 AM  Jonathan Cervantes  has presented today for surgery, with the diagnosis of left retained stent  The various methods of treatment have been discussed with the patient and family. After consideration of risks, benefits and other options for treatment, the patient has consented to  Procedure(s): CYSTOSCOPY WITH STENT REMOVAL (Left) as a surgical intervention .  The patient's history has been reviewed, patient examined, no change in status, stable for surgery.  I have reviewed the patient's chart and labs.  Questions were answered to the patient's satisfaction.    RRR Lungs clear  Underwent successful cysto, left URS, stone removal, stent placement. Unable to remove stent in office due to friable prostate with very large median lobe.  Presents of cysto/stent removal under anesthesisa.  Nickie Retort

## 2016-07-24 NOTE — Discharge Instructions (Signed)
AMBULATORY SURGERY  °DISCHARGE INSTRUCTIONS ° ° °1) The drugs that you were given will stay in your system until tomorrow so for the next 24 hours you should not: ° °A) Drive an automobile °B) Make any legal decisions °C) Drink any alcoholic beverage ° ° °2) You may resume regular meals tomorrow.  Today it is better to start with liquids and gradually work up to solid foods. ° °You may eat anything you prefer, but it is better to start with liquids, then soup and crackers, and gradually work up to solid foods. ° ° °3) Please notify your doctor immediately if you have any unusual bleeding, trouble breathing, redness and pain at the surgery site, drainage, fever, or pain not relieved by medication. ° ° ° °4) Additional Instructions: ° ° ° ° ° ° ° °Please contact your physician with any problems or Same Day Surgery at [phone], Monday through Friday 6 am to 4 pm, or [place] [facility] Main number at [phone]. °

## 2016-07-28 ENCOUNTER — Telehealth: Payer: Self-pay | Admitting: Urology

## 2016-07-28 NOTE — Telephone Encounter (Signed)
done

## 2016-07-28 NOTE — Telephone Encounter (Signed)
-----   Message from Nickie Retort, MD sent at 07/24/2016 10:12 AM EDT ----- Patient needs to see me in one month with a renal u/s prior. Thanks.

## 2016-07-30 ENCOUNTER — Telehealth: Payer: Self-pay | Admitting: Urology

## 2016-07-30 DIAGNOSIS — N200 Calculus of kidney: Secondary | ICD-10-CM

## 2016-07-30 MED ORDER — TAMSULOSIN HCL 0.4 MG PO CAPS: 0.4000 mg | ORAL_CAPSULE | Freq: Every day | ORAL | 3 refills | Status: DC

## 2016-07-30 NOTE — Telephone Encounter (Signed)
Medication sent to pharmacy  

## 2016-07-30 NOTE — Telephone Encounter (Signed)
Patient called the office and left a message on the voice mail.  He was given a prescription for Flomax in the ER.  Dr. Pilar Jarvis advised him to continue to take it.  He is almost out of the medication and needs a new prescription.  He uses [facility] [place].  Please call him at [phone].

## 2016-08-11 ENCOUNTER — Ambulatory Visit
Admission: RE | Admit: 2016-08-11 | Discharge: 2016-08-11 | Disposition: A | Discharge status: Home / Self Care | Payer: BLUE CROSS/BLUE SHIELD | Source: Ambulatory Visit | Attending: Urology | Admitting: Urology

## 2016-08-11 DIAGNOSIS — N201 Calculus of ureter: Secondary | ICD-10-CM

## 2016-08-11 DIAGNOSIS — N132 Hydronephrosis with renal and ureteral calculous obstruction: Secondary | ICD-10-CM | POA: Diagnosis not present

## 2016-08-11 DIAGNOSIS — N400 Enlarged prostate without lower urinary tract symptoms: Secondary | ICD-10-CM | POA: Insufficient documentation

## 2016-08-14 ENCOUNTER — Ambulatory Visit (INDEPENDENT_AMBULATORY_CARE_PROVIDER_SITE_OTHER): Payer: BLUE CROSS/BLUE SHIELD | Admitting: Urology

## 2016-08-14 ENCOUNTER — Encounter: Payer: Self-pay | Admitting: Urology

## 2016-08-14 VITALS — BP 159/101 | HR 105 | Ht 71.0 in | Wt 178.9 lb

## 2016-08-14 DIAGNOSIS — Z125 Encounter for screening for malignant neoplasm of prostate: Secondary | ICD-10-CM

## 2016-08-14 DIAGNOSIS — N400 Enlarged prostate without lower urinary tract symptoms: Secondary | ICD-10-CM

## 2016-08-14 DIAGNOSIS — N200 Calculus of kidney: Secondary | ICD-10-CM | POA: Diagnosis not present
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
## 2016-08-15 ENCOUNTER — Telehealth: Payer: Self-pay

## 2016-08-15 LAB — PSA: Prostate Specific Ag, Serum: 3.2 ng/mL (ref 0.0–4.0)

## 2016-08-15 NOTE — Telephone Encounter (Signed)
Jonathan Retort, MD  Lestine Box, LPN        Please let patient know PSA was normal. Thanks    Spoke with pt in reference to PSA results. Pt voiced understanding.

## 2016-08-29 ENCOUNTER — Other Ambulatory Visit: Payer: BLUE CROSS/BLUE SHIELD

## 2016-09-04 ENCOUNTER — Encounter: Payer: Self-pay | Admitting: Urology

## 2016-09-04 ENCOUNTER — Other Ambulatory Visit: Payer: Self-pay | Admitting: Urology

## 2016-09-18 ENCOUNTER — Ambulatory Visit: Payer: BLUE CROSS/BLUE SHIELD | Admitting: Family Medicine

## 2016-09-23 ENCOUNTER — Ambulatory Visit
Admission: RE | Admit: 2016-09-23 | Discharge: 2016-09-23 | Disposition: A | Discharge status: Home / Self Care | Payer: BLUE CROSS/BLUE SHIELD | Source: Ambulatory Visit | Attending: Urology | Admitting: Urology

## 2016-09-23 DIAGNOSIS — N200 Calculus of kidney: Secondary | ICD-10-CM | POA: Diagnosis not present

## 2016-09-23 DIAGNOSIS — N281 Cyst of kidney, acquired: Secondary | ICD-10-CM | POA: Insufficient documentation

## 2016-09-23 DIAGNOSIS — N400 Enlarged prostate without lower urinary tract symptoms: Secondary | ICD-10-CM | POA: Insufficient documentation

## 2016-09-25 ENCOUNTER — Ambulatory Visit (INDEPENDENT_AMBULATORY_CARE_PROVIDER_SITE_OTHER): Payer: BLUE CROSS/BLUE SHIELD | Admitting: Urology

## 2016-09-25 ENCOUNTER — Ambulatory Visit (INDEPENDENT_AMBULATORY_CARE_PROVIDER_SITE_OTHER): Payer: BLUE CROSS/BLUE SHIELD | Admitting: Family Medicine

## 2016-09-25 ENCOUNTER — Encounter: Payer: Self-pay | Admitting: Urology

## 2016-09-25 ENCOUNTER — Encounter: Payer: Self-pay | Admitting: Family Medicine

## 2016-09-25 VITALS — BP 157/72 | HR 97 | Ht 71.0 in | Wt 178.1 lb

## 2016-09-25 VITALS — BP 146/80 | HR 101 | Temp 98.5°F | Resp 16 | Ht 68.75 in | Wt 177.4 lb

## 2016-09-25 DIAGNOSIS — R911 Solitary pulmonary nodule: Secondary | ICD-10-CM | POA: Diagnosis not present

## 2016-09-25 DIAGNOSIS — Z0000 Encounter for general adult medical examination without abnormal findings: Secondary | ICD-10-CM

## 2016-09-25 DIAGNOSIS — I10 Essential (primary) hypertension: Secondary | ICD-10-CM

## 2016-09-25 DIAGNOSIS — Z23 Encounter for immunization: Secondary | ICD-10-CM

## 2016-09-25 DIAGNOSIS — E119 Type 2 diabetes mellitus without complications: Secondary | ICD-10-CM

## 2016-09-25 DIAGNOSIS — N200 Calculus of kidney: Secondary | ICD-10-CM

## 2016-09-25 DIAGNOSIS — Z130 Encounter for screening for diseases of the blood and blood-forming organs and certain disorders involving the immune mechanism: Secondary | ICD-10-CM

## 2016-09-25 MED ORDER — POTASSIUM CITRATE ER 15 MEQ (1620 MG) PO TBCR: 1.0000 | EXTENDED_RELEASE_TABLET | Freq: Two times a day (BID) | ORAL | 11 refills | Status: DC

## 2016-09-25 NOTE — Patient Instructions (Addendum)
We will call with your lab results.  Check BP daily. Goal 130/80.  We will arrange the CT.  Follow up in 1 month.  Take care
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
## 2016-09-26 ENCOUNTER — Telehealth: Payer: Self-pay | Admitting: *Deleted

## 2016-09-26 ENCOUNTER — Encounter: Payer: Self-pay | Admitting: Family Medicine

## 2016-09-26 DIAGNOSIS — R911 Solitary pulmonary nodule: Secondary | ICD-10-CM | POA: Insufficient documentation

## 2016-09-26 DIAGNOSIS — K760 Fatty (change of) liver, not elsewhere classified: Secondary | ICD-10-CM | POA: Insufficient documentation

## 2016-09-26 DIAGNOSIS — E119 Type 2 diabetes mellitus without complications: Secondary | ICD-10-CM | POA: Insufficient documentation

## 2016-09-26 DIAGNOSIS — N400 Enlarged prostate without lower urinary tract symptoms: Secondary | ICD-10-CM | POA: Insufficient documentation

## 2016-09-26 DIAGNOSIS — I700 Atherosclerosis of aorta: Secondary | ICD-10-CM | POA: Insufficient documentation

## 2016-09-26 DIAGNOSIS — E1159 Type 2 diabetes mellitus with other circulatory complications: Secondary | ICD-10-CM | POA: Insufficient documentation

## 2016-09-26 DIAGNOSIS — K8020 Calculus of gallbladder without cholecystitis without obstruction: Secondary | ICD-10-CM | POA: Insufficient documentation

## 2016-09-26 DIAGNOSIS — I10 Essential (primary) hypertension: Secondary | ICD-10-CM

## 2016-09-26 DIAGNOSIS — I152 Hypertension secondary to endocrine disorders: Secondary | ICD-10-CM | POA: Insufficient documentation

## 2016-09-26 DIAGNOSIS — E1169 Type 2 diabetes mellitus with other specified complication: Secondary | ICD-10-CM | POA: Insufficient documentation

## 2016-09-26 DIAGNOSIS — Z0000 Encounter for general adult medical examination without abnormal findings: Secondary | ICD-10-CM | POA: Insufficient documentation

## 2016-09-26 LAB — CBC
HCT: 51.6 % (ref 39.0–52.0)
Hemoglobin: 18.1 g/dL (ref 13.0–17.0)
MCHC: 35 g/dL (ref 30.0–36.0)
MCV: 86.4 fl (ref 78.0–100.0)
Platelets: 196 10*3/uL (ref 150.0–400.0)
RBC: 5.98 Mil/uL — ABNORMAL HIGH (ref 4.22–5.81)
RDW: 13.2 % (ref 11.5–15.5)
WBC: 5.8 10*3/uL (ref 4.0–10.5)

## 2016-09-26 LAB — LIPID PANEL
Cholesterol: 163 mg/dL (ref 0–200)
HDL: 31.3 mg/dL — AB (ref >39.00)
Total CHOL/HDL Ratio: 5
Triglycerides: 473 mg/dL — ABNORMAL HIGH (ref 0.0–149.0)

## 2016-09-26 LAB — COMPREHENSIVE METABOLIC PANEL
ALT: 25 U/L (ref 0–53)
AST: 16 U/L (ref 0–37)
Albumin: 4.4 g/dL (ref 3.5–5.2)
Alkaline Phosphatase: 74 U/L (ref 39–117)
BILIRUBIN TOTAL: 0.7 mg/dL (ref 0.2–1.2)
BUN: 15 mg/dL (ref 6–23)
CO2: 30 meq/L (ref 19–32)
Calcium: 9.8 mg/dL (ref 8.4–10.5)
Chloride: 99 mEq/L (ref 96–112)
Creatinine, Ser: 1 mg/dL (ref 0.40–1.50)
GFR: 79.96 mL/min (ref >60.00)
GLUCOSE: 343 mg/dL — AB (ref 70–99)
Potassium: 4.4 mEq/L (ref 3.5–5.1)
SODIUM: 135 meq/L (ref 135–145)
Total Protein: 7.1 g/dL (ref 6.0–8.3)

## 2016-09-26 LAB — PTH, INTACT AND CALCIUM: CALCIUM: 9.4 mg/dL (ref 8.6–10.2)

## 2016-09-26 LAB — LDL CHOLESTEROL, DIRECT: Direct LDL: 85 mg/dL

## 2016-09-26 LAB — HEMOGLOBIN A1C: HEMOGLOBIN A1C: 10.7 % — AB (ref 4.6–6.5)

## 2016-09-26 LAB — URIC ACID: URIC ACID: 5.1 mg/dL (ref 3.7–8.6)

## 2016-09-26 NOTE — Assessment & Plan Note (Signed)
Unsure of control. Awaiting A1C.

## 2016-09-26 NOTE — Assessment & Plan Note (Signed)
BP elevated today. Labs today. Awaiting labs prior to treatment. Advised to monitor BP at home.

## 2016-09-26 NOTE — Assessment & Plan Note (Signed)
New problem. Uncertain prognosis. Proceeding with follow up CT.

## 2016-09-26 NOTE — Assessment & Plan Note (Signed)
Labs today. Pneumococcal vaccine today. Declined hep C and HIV screening. Will arrange colonoscopy at a later office visit.
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
## 2016-09-26 NOTE — Telephone Encounter (Signed)
Noted. Awaiting Metabolic panel prior to determining course of action.

## 2016-09-26 NOTE — Telephone Encounter (Addendum)
CRITICAL VALUE STICKER  CRITICAL VALUE: Hemoglobin: 18.1  RECEIVER (on-site recipient of call): Jari Favre, CMA  DATE & TIME NOTIFIED: 09/26/16 @ 11:52am  MESSENGER (representative from lab): [facility]  MD NOTIFIED: Dr. Lacinda Axon  TIME OF NOTIFICATION: 11:55am  RESPONSE:

## 2016-09-30 ENCOUNTER — Encounter: Payer: Self-pay | Admitting: *Deleted

## 2016-09-30 ENCOUNTER — Telehealth: Payer: Self-pay | Admitting: *Deleted

## 2016-09-30 NOTE — Telephone Encounter (Signed)
Yes

## 2016-09-30 NOTE — Telephone Encounter (Signed)
Please give a accepted time and date to place pt for a F/U this week. Pt was advised to return for a follow up with Dr. Lacinda Axon.  Pt contact [phone]

## 2016-09-30 NOTE — Telephone Encounter (Signed)
See below, ok for 15 minute slot, thanks

## 2016-09-30 NOTE — Telephone Encounter (Signed)
This time advised is only a 15 min slot, will this be appropriate, if so I'll schedule pt -thanks

## 2016-09-30 NOTE — Telephone Encounter (Signed)
There is a follow up available Friday afternoon on the schedule, use that

## 2016-09-30 NOTE — Telephone Encounter (Signed)
Can a 15 be used for him, please advise, thanks

## 2016-10-01 ENCOUNTER — Telehealth: Payer: Self-pay | Admitting: *Deleted

## 2016-10-01 ENCOUNTER — Other Ambulatory Visit: Payer: Self-pay | Admitting: Family Medicine

## 2016-10-01 DIAGNOSIS — D582 Other hemoglobinopathies: Secondary | ICD-10-CM

## 2016-10-01 NOTE — Telephone Encounter (Signed)
Scheduled

## 2016-10-01 NOTE — Telephone Encounter (Signed)
Pt has lab appt on 10/02/16. Please place future orders.  Thanks

## 2016-10-02 ENCOUNTER — Other Ambulatory Visit (INDEPENDENT_AMBULATORY_CARE_PROVIDER_SITE_OTHER): Payer: BLUE CROSS/BLUE SHIELD

## 2016-10-02 DIAGNOSIS — D582 Other hemoglobinopathies: Secondary | ICD-10-CM | POA: Diagnosis not present

## 2016-10-02 LAB — CBC
HCT: 51.2 % (ref 39.0–52.0)
Hemoglobin: 17.7 g/dL — ABNORMAL HIGH (ref 13.0–17.0)
MCHC: 34.5 g/dL (ref 30.0–36.0)
MCV: 86.9 fl (ref 78.0–100.0)
PLATELETS: 207 10*3/uL (ref 150.0–400.0)
RBC: 5.89 Mil/uL — AB (ref 4.22–5.81)
RDW: 13.2 % (ref 11.5–15.5)
WBC: 6.5 10*3/uL (ref 4.0–10.5)

## 2016-10-03 ENCOUNTER — Ambulatory Visit (INDEPENDENT_AMBULATORY_CARE_PROVIDER_SITE_OTHER): Payer: BLUE CROSS/BLUE SHIELD | Admitting: Family Medicine

## 2016-10-03 VITALS — BP 130/82 | HR 102 | Temp 98.0°F | Resp 16 | Wt 178.0 lb

## 2016-10-03 DIAGNOSIS — D582 Other hemoglobinopathies: Secondary | ICD-10-CM | POA: Diagnosis not present

## 2016-10-03 DIAGNOSIS — E119 Type 2 diabetes mellitus without complications: Secondary | ICD-10-CM

## 2016-10-03 MED ORDER — METFORMIN HCL ER 500 MG PO TB24: 1000.0000 mg | ORAL_TABLET | Freq: Two times a day (BID) | ORAL | Status: DC

## 2016-10-03 NOTE — Patient Instructions (Signed)
Increase the metformin to 1000 in the am and 1000 mg in the pm.  I will talk to hematology prior to starting your new medication.   Follow up in 1 month.  Take care  Dr. Lacinda Axon

## 2016-10-03 NOTE — Assessment & Plan Note (Signed)
Uncontrolled/worsening. Increasing metformin to total 2000 mg daily. Planning to add additional medication, preferably Jardiance. However, given elevated hemoglobin, will discuss with hematology before starting as I'm concerned about potential hemoconcentration.

## 2016-10-03 NOTE — Assessment & Plan Note (Signed)
New problem. Persistent. Uncertain etiology at this time.  Sending to Hematology.

## 2016-10-03 NOTE — Progress Notes (Signed)
Subjective:  Patient ID: Jonathan Cervantes, male    DOB: 05/31/1950  Age: 66 y.o. MRN: [id number]  CC: Follow up  HPI:  66 year old male with uncontrolled diabetes, hypertension, hepatic steatosis, BPH, history of nephrolithiasis presents for follow-up.  DM 2  Uncontrolled.  At last visit, his hemoglobin A1c was obtained and was 10.7.  He is currently taking metformin 1000 mg in the morning and 500 mg in the afternoon/evening.  His sugars have been in the 200's.   He presents today to discuss additional therapeutic options given that his blood sugars are uncontrolled.  Elevated hemoglobin  Patient's hemoglobin was elevated on laboratory studies at last visit.  Repeat hemoglobin was improved but still elevated at 17.7.  Uncertain etiology.  Will discuss next steps today.  Social Hx   Social History   Social History  . Marital status: Married    Spouse name: N/A  . Number of children: N/A  . Years of education: N/A   Occupational History  . retired    Social History Main Topics  . Smoking status: Never Smoker  . Smokeless tobacco: Never Used  . Alcohol use No  . Drug use: No  . Sexual activity: Yes   Other Topics Concern  . Not on file   Social History Narrative  . No narrative on file    Review of Systems  Constitutional: Negative.   Gastrointestinal: Positive for diarrhea.  Endocrine: Negative.    Objective:  BP 130/82 (BP Location: Left Arm, Patient Position: Sitting, Cuff Size: Normal)   Pulse (!) 102   Temp 98 F (36.7 C) (Oral)   Resp 16   Wt 178 lb (80.7 kg)   SpO2 96%   BMI 26.48 kg/m   BP/Weight 10/03/2016 06/27/7562 06/21/2949  Systolic BP 884 166 063  Diastolic BP 82 80 72  Wt. (Lbs) 178 177.38 178.1  BMI 26.48 26.38 24.84   Physical Exam  Constitutional: He is oriented to person, place, and time. He appears well-developed. No distress.  Cardiovascular: Regular rhythm.   Tachycardic.  Pulmonary/Chest: Effort normal and breath sounds  normal.  Neurological: He is alert and oriented to person, place, and time.  Psychiatric: He has a normal mood and affect.  Vitals reviewed.   Lab Results  Component Value Date   WBC 6.5 10/02/2016   HGB 17.7 (H) 10/02/2016   HCT 51.2 10/02/2016   PLT 207.0 10/02/2016   GLUCOSE 343 (H) 09/25/2016   CHOL 163 09/25/2016   TRIG (H) 09/25/2016    473.0 Triglyceride is over 400; calculations on Lipids are invalid.   HDL 31.30 (L) 09/25/2016   LDLDIRECT 85.0 09/25/2016   ALT 25 09/25/2016   AST 16 09/25/2016   NA 135 09/25/2016   K 4.4 09/25/2016   CL 99 09/25/2016   CREATININE 1.00 09/25/2016   BUN 15 09/25/2016   CO2 30 09/25/2016   HGBA1C 10.7 (H) 09/25/2016    Assessment & Plan:   Problem List Items Addressed This Visit    Elevated hemoglobin ([place])    New problem. Persistent. Uncertain etiology at this time.  Sending to Hematology.      Relevant Orders   Ambulatory referral to Hematology   DM (diabetes mellitus), type 2 ([place]) - Primary    Uncontrolled/worsening. Increasing metformin to total 2000 mg daily. Planning to add additional medication, preferably Jardiance. However, given elevated hemoglobin, will discuss with hematology before starting as I'm concerned about potential hemoconcentration.      Relevant Medications  metFORMIN (GLUCOPHAGE-XR) 500 MG 24 hr tablet      Meds ordered this encounter  Medications  . DISCONTD: metFORMIN (GLUCOPHAGE-XR) 500 MG 24 hr tablet    Sig: Take 500 mg by mouth 2 (two) times daily.  . metFORMIN (GLUCOPHAGE-XR) 500 MG 24 hr tablet    Sig: Take 2 tablets (1,000 mg total) by mouth 2 (two) times daily.   Follow-up: 1 month  [place]

## 2016-10-05 ENCOUNTER — Other Ambulatory Visit: Payer: Self-pay | Admitting: Family Medicine

## 2016-10-06 ENCOUNTER — Other Ambulatory Visit: Payer: Self-pay

## 2016-10-06 ENCOUNTER — Other Ambulatory Visit: Payer: BLUE CROSS/BLUE SHIELD

## 2016-10-06 DIAGNOSIS — N200 Calculus of kidney: Secondary | ICD-10-CM

## 2016-10-06 MED ORDER — METFORMIN HCL ER 500 MG PO TB24: 1000.0000 mg | ORAL_TABLET | Freq: Two times a day (BID) | ORAL | Status: DC

## 2016-10-07 LAB — SPECIMEN STATUS

## 2016-10-07 LAB — PARATHYROID HORMONE, INTACT (NO CA)

## 2016-10-07 LAB — URIC ACID: URIC ACID: 5.3 mg/dL (ref 3.7–8.6)

## 2016-10-10 ENCOUNTER — Other Ambulatory Visit: Payer: Self-pay | Admitting: Urology

## 2016-10-10 ENCOUNTER — Encounter: Payer: Self-pay | Admitting: Family Medicine

## 2016-10-10 ENCOUNTER — Telehealth: Payer: Self-pay | Admitting: Family Medicine

## 2016-10-10 MED ORDER — METFORMIN HCL ER 500 MG PO TB24: 1000.0000 mg | ORAL_TABLET | Freq: Two times a day (BID) | ORAL | 0 refills | Status: DC

## 2016-10-10 NOTE — Telephone Encounter (Signed)
Received call from team health. Patient was to start an increased dose of metformin and this was supposed to be sent to his pharmacy previously. It appears that the clinic RN selected no print as the option for transmission and thus it did not get to the pharmacy. I will resend this to his pharmacy. FYI to PCP.

## 2016-10-13 ENCOUNTER — Telehealth: Payer: Self-pay

## 2016-10-13 ENCOUNTER — Telehealth: Payer: Self-pay | Admitting: Radiology

## 2016-10-13 DIAGNOSIS — R911 Solitary pulmonary nodule: Secondary | ICD-10-CM

## 2016-10-13 NOTE — Telephone Encounter (Signed)
Pt states he has received a letter stating the urine sample for parathyroid test was insufficient. This is the second time this has happened. Pt would like a return call to [phone].

## 2016-10-13 NOTE — Telephone Encounter (Signed)
Corrected order has been placed.  Thanks.

## 2016-10-13 NOTE — Telephone Encounter (Signed)
Need corrected  for CT chest

## 2016-10-14 ENCOUNTER — Other Ambulatory Visit: Payer: Self-pay

## 2016-10-14 ENCOUNTER — Ambulatory Visit: Payer: BLUE CROSS/BLUE SHIELD

## 2016-10-14 ENCOUNTER — Ambulatory Visit
Admission: RE | Admit: 2016-10-14 | Discharge: 2016-10-14 | Disposition: A | Discharge status: Home / Self Care | Payer: BLUE CROSS/BLUE SHIELD | Source: Ambulatory Visit | Attending: Family Medicine | Admitting: Family Medicine

## 2016-10-14 ENCOUNTER — Other Ambulatory Visit: Payer: Self-pay | Admitting: Family Medicine

## 2016-10-14 ENCOUNTER — Telehealth: Payer: Self-pay

## 2016-10-14 DIAGNOSIS — M4854XA Collapsed vertebra, not elsewhere classified, thoracic region, initial encounter for fracture: Secondary | ICD-10-CM | POA: Diagnosis not present

## 2016-10-14 DIAGNOSIS — K8020 Calculus of gallbladder without cholecystitis without obstruction: Secondary | ICD-10-CM | POA: Insufficient documentation

## 2016-10-14 DIAGNOSIS — I700 Atherosclerosis of aorta: Secondary | ICD-10-CM | POA: Insufficient documentation

## 2016-10-14 DIAGNOSIS — R911 Solitary pulmonary nodule: Secondary | ICD-10-CM | POA: Insufficient documentation

## 2016-10-14 MED ORDER — METFORMIN HCL ER 500 MG PO TB24: 1000.0000 mg | ORAL_TABLET | Freq: Two times a day (BID) | ORAL | 0 refills | Status: DC

## 2016-10-14 NOTE — Telephone Encounter (Signed)
Call report from [facility] from [place],  [place] of Chest,  IMPRESSION:  Macrolobulated noncalcified soft tissue nodule RIGHT lower lobe 18 x 14 x 15 mm previously 17 x 13 x 15 mm coma of uncertain etiology but malignancy is not excluded.  As this has persisted at 3 months, recommend assessment by PET CT to exclude malignancy.  Cholelithiasis.  Age-indeterminate superior endplate compression fracture of T6 vertebral body.  Aortic Atherosclerosis (ICD10-I70.0).  These results will be called to the ordering clinician or representative by the Radiologist Assistant, and communication documented in the PACS or zVision Dashboard.  Please advise?

## 2016-10-15 ENCOUNTER — Other Ambulatory Visit: Payer: Self-pay | Admitting: Radiology

## 2016-10-15 ENCOUNTER — Other Ambulatory Visit: Payer: Self-pay | Admitting: Family Medicine

## 2016-10-15 ENCOUNTER — Other Ambulatory Visit: Payer: BLUE CROSS/BLUE SHIELD

## 2016-10-15 DIAGNOSIS — R82994 Hypercalciuria: Secondary | ICD-10-CM

## 2016-10-15 DIAGNOSIS — R911 Solitary pulmonary nodule: Secondary | ICD-10-CM

## 2016-10-16 LAB — SPECIMEN STATUS

## 2016-10-20 ENCOUNTER — Encounter: Payer: Self-pay | Admitting: Family Medicine

## 2016-10-20 ENCOUNTER — Telehealth: Payer: Self-pay | Admitting: Family Medicine

## 2016-10-20 DIAGNOSIS — D751 Secondary polycythemia: Secondary | ICD-10-CM | POA: Insufficient documentation

## 2016-10-20 DIAGNOSIS — R911 Solitary pulmonary nodule: Secondary | ICD-10-CM
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
## 2016-10-20 NOTE — Telephone Encounter (Signed)
Order placed

## 2016-10-20 NOTE — Telephone Encounter (Signed)
FYI, Order needs to be changed to Skull Base to Thigh. Radiology states that only time pt gets whole body PET is for Melenoma. Thank you!

## 2016-10-20 NOTE — Telephone Encounter (Signed)
It needs to be [id number]

## 2016-10-21 ENCOUNTER — Inpatient Hospital Stay: Payer: BLUE CROSS/BLUE SHIELD

## 2016-10-21 ENCOUNTER — Other Ambulatory Visit: Payer: Self-pay | Admitting: Family Medicine

## 2016-10-21 ENCOUNTER — Encounter: Payer: Self-pay | Admitting: Family Medicine

## 2016-10-21 ENCOUNTER — Inpatient Hospital Stay: Payer: BLUE CROSS/BLUE SHIELD | Attending: Oncology | Admitting: Oncology

## 2016-10-21 DIAGNOSIS — I10 Essential (primary) hypertension: Secondary | ICD-10-CM | POA: Diagnosis not present

## 2016-10-21 DIAGNOSIS — Z79899 Other long term (current) drug therapy: Secondary | ICD-10-CM | POA: Diagnosis not present

## 2016-10-21 DIAGNOSIS — J45909 Unspecified asthma, uncomplicated: Secondary | ICD-10-CM | POA: Diagnosis not present

## 2016-10-21 DIAGNOSIS — R911 Solitary pulmonary nodule: Secondary | ICD-10-CM | POA: Insufficient documentation

## 2016-10-21 DIAGNOSIS — N281 Cyst of kidney, acquired: Secondary | ICD-10-CM | POA: Diagnosis not present

## 2016-10-21 DIAGNOSIS — E860 Dehydration: Secondary | ICD-10-CM | POA: Insufficient documentation

## 2016-10-21 DIAGNOSIS — N200 Calculus of kidney: Secondary | ICD-10-CM | POA: Insufficient documentation

## 2016-10-21 DIAGNOSIS — N400 Enlarged prostate without lower urinary tract symptoms: Secondary | ICD-10-CM | POA: Diagnosis not present

## 2016-10-21 DIAGNOSIS — D751 Secondary polycythemia: Secondary | ICD-10-CM

## 2016-10-21 DIAGNOSIS — I700 Atherosclerosis of aorta: Secondary | ICD-10-CM | POA: Insufficient documentation

## 2016-10-21 DIAGNOSIS — Z87442 Personal history of urinary calculi: Secondary | ICD-10-CM | POA: Diagnosis not present

## 2016-10-21 DIAGNOSIS — E119 Type 2 diabetes mellitus without complications: Secondary | ICD-10-CM | POA: Diagnosis not present

## 2016-10-21 LAB — PTH, INTACT AND CALCIUM
Calcium: 9.7 mg/dL (ref 8.6–10.2)
PTH: 19 pg/mL (ref 15–65)

## 2016-10-21 LAB — IRON AND TIBC
IRON: 88 ug/dL (ref 45–182)
Saturation Ratios: 26 % (ref 17.9–39.5)
TIBC: 345 ug/dL (ref 250–450)
UIBC: 257 ug/dL

## 2016-10-21 LAB — FERRITIN: FERRITIN: 114 ng/mL (ref 24–336)

## 2016-10-21 MED ORDER — EMPAGLIFLOZIN 10 MG PO TABS: 10.0000 mg | ORAL_TABLET | Freq: Every day | ORAL | 1 refills | Status: DC

## 2016-10-21 NOTE — Progress Notes (Signed)
Patient here today for new evaluation regarding elevated hemoglobin.

## 2016-10-22 LAB — ERYTHROPOIETIN: Erythropoietin: 8.8 m[IU]/mL (ref 2.6–18.5)

## 2016-10-23 LAB — CBC
HEMATOCRIT: 47.4 % (ref 39.0–52.0)
HEMOGLOBIN: 16.9 g/dL (ref 13.0–17.0)
MCH: 30.1 pg (ref 26.0–34.0)
MCHC: 35.7 g/dL (ref 32.0–36.0)
MCV: 84.2 fL (ref 80.0–100.0)
Platelets: 191 10*3/uL (ref 150–440)
RBC: 5.63 MIL/uL (ref 4.40–5.90)
RDW: 12.9 % (ref 11.5–14.5)
WBC: 5.6 10*3/uL (ref 3.8–10.6)

## 2016-10-23 LAB — CARBON MONOXIDE, BLOOD (PERFORMED AT REF LAB): Carbon Monoxide, Blood: 2.4 % (ref 0.0–3.6)

## 2016-10-24 LAB — JAK2 GENOTYPR

## 2016-10-25 ENCOUNTER — Encounter: Payer: Self-pay | Admitting: Family Medicine

## 2016-10-25 ENCOUNTER — Encounter: Payer: Self-pay | Admitting: Oncology

## 2016-10-27 ENCOUNTER — Encounter: Payer: Self-pay | Admitting: Family Medicine

## 2016-10-28 ENCOUNTER — Ambulatory Visit
Admission: RE | Admit: 2016-10-28 | Discharge: 2016-10-28 | Disposition: A | Discharge status: Home / Self Care | Payer: BLUE CROSS/BLUE SHIELD | Source: Ambulatory Visit | Attending: Family Medicine | Admitting: Family Medicine

## 2016-10-28 DIAGNOSIS — R911 Solitary pulmonary nodule: Secondary | ICD-10-CM | POA: Diagnosis present

## 2016-10-28 DIAGNOSIS — N400 Enlarged prostate without lower urinary tract symptoms: Secondary | ICD-10-CM | POA: Diagnosis not present

## 2016-10-28 DIAGNOSIS — I2510 Atherosclerotic heart disease of native coronary artery without angina pectoris: Secondary | ICD-10-CM | POA: Insufficient documentation

## 2016-10-28 DIAGNOSIS — I700 Atherosclerosis of aorta: Secondary | ICD-10-CM | POA: Insufficient documentation

## 2016-10-28 DIAGNOSIS — K8020 Calculus of gallbladder without cholecystitis without obstruction: Secondary | ICD-10-CM | POA: Diagnosis not present

## 2016-10-28 DIAGNOSIS — E279 Disorder of adrenal gland, unspecified: Secondary | ICD-10-CM | POA: Diagnosis not present

## 2016-10-28 DIAGNOSIS — J320 Chronic maxillary sinusitis: Secondary | ICD-10-CM | POA: Diagnosis not present

## 2016-10-28 DIAGNOSIS — K760 Fatty (change of) liver, not elsewhere classified: Secondary | ICD-10-CM | POA: Diagnosis not present

## 2016-10-28 LAB — GLUCOSE, CAPILLARY: GLUCOSE-CAPILLARY: 190 mg/dL — AB (ref 65–99)

## 2016-10-28 MED ORDER — FLUDEOXYGLUCOSE F - 18 (FDG) INJECTION
12.0000 | Freq: Once | INTRAVENOUS | Status: AC | PRN
  Administered 2016-10-28: 12.75 via INTRAVENOUS

## 2016-11-04 ENCOUNTER — Ambulatory Visit (INDEPENDENT_AMBULATORY_CARE_PROVIDER_SITE_OTHER): Payer: BLUE CROSS/BLUE SHIELD | Admitting: Family Medicine

## 2016-11-04 ENCOUNTER — Encounter: Payer: Self-pay | Admitting: Family Medicine

## 2016-11-04 VITALS — BP 130/78 | HR 101 | Temp 98.4°F | Wt 181.2 lb

## 2016-11-04 DIAGNOSIS — E119 Type 2 diabetes mellitus without complications: Secondary | ICD-10-CM

## 2016-11-04 DIAGNOSIS — I10 Essential (primary) hypertension: Secondary | ICD-10-CM | POA: Diagnosis not present

## 2016-11-04 DIAGNOSIS — I2510 Atherosclerotic heart disease of native coronary artery without angina pectoris: Secondary | ICD-10-CM | POA: Diagnosis not present

## 2016-11-04 DIAGNOSIS — D751 Secondary polycythemia: Secondary | ICD-10-CM | POA: Diagnosis not present

## 2016-11-04 LAB — MICROALBUMIN / CREATININE URINE RATIO
CREATININE, U: 79.6 mg/dL
MICROALB/CREAT RATIO: 0.9 mg/g (ref 0.0–30.0)

## 2016-11-04 LAB — COMPREHENSIVE METABOLIC PANEL
ALBUMIN: 4.4 g/dL (ref 3.5–5.2)
ALT: 21 U/L (ref 0–53)
AST: 20 U/L (ref 0–37)
Alkaline Phosphatase: 60 U/L (ref 39–117)
BILIRUBIN TOTAL: 0.7 mg/dL (ref 0.2–1.2)
BUN: 18 mg/dL (ref 6–23)
CALCIUM: 9.9 mg/dL (ref 8.4–10.5)
CHLORIDE: 103 meq/L (ref 96–112)
CO2: 25 mEq/L (ref 19–32)
CREATININE: 1.13 mg/dL (ref 0.40–1.50)
GFR: 69.42 mL/min (ref >60.00)
Glucose, Bld: 152 mg/dL — ABNORMAL HIGH (ref 70–99)
Potassium: 4.4 mEq/L (ref 3.5–5.1)
Sodium: 136 mEq/L (ref 135–145)
Total Protein: 7 g/dL (ref 6.0–8.3)

## 2016-11-04 LAB — CBC
HCT: 50.3 % (ref 39.0–52.0)
HEMOGLOBIN: 17.8 g/dL — AB (ref 13.0–17.0)
MCHC: 35.3 g/dL (ref 30.0–36.0)
MCV: 86 fl (ref 78.0–100.0)
PLATELETS: 226 10*3/uL (ref 150.0–400.0)
RBC: 5.85 Mil/uL — AB (ref 4.22–5.81)
RDW: 13.4 % (ref 11.5–15.5)
WBC: 6.2 10*3/uL (ref 4.0–10.5)

## 2016-11-04 MED ORDER — ASPIRIN EC 81 MG PO TBEC: 81.0000 mg | DELAYED_RELEASE_TABLET | Freq: Every day | ORAL | Status: DC

## 2016-11-04 NOTE — Assessment & Plan Note (Signed)
Improving but not at goal. Continue metformin and Jardiance. Obtaining labs today. If labs okay, will increase Jardiance to 25 mg daily.

## 2016-11-04 NOTE — Progress Notes (Signed)
Subjective:  Patient ID: Jonathan Cervantes, male    DOB: April 10, 1951  Age: 66 y.o. MRN: [id number]  CC: Follow up  HPI:  66 year old male with aortic atherosclerosis, coronary atherosclerosis, hypertension, hepatic steatosis, DM 2, BPH, secondary polycythemia presents for follow-up.  DM 2  Sugars improving but not a goal. His average is in the 200 range.  He is currently on metformin 1000 mg twice a day, and Jardiance 10 mg daily.  Tolerated without difficulty.  Hypertension  At goal currently.  Will discuss adding ACEI or ARB for renal protection.  Coronary atherosclerosis  Noted on recent imaging.  Patient currently asymptomatic.  We'll discuss adding aspirin today.  Social Hx   Social History   Social History  . Marital status: Married    Spouse name: N/A  . Number of children: N/A  . Years of education: N/A   Occupational History  . retired    Social History Main Topics  . Smoking status: Never Smoker  . Smokeless tobacco: Never Used  . Alcohol use No  . Drug use: No  . Sexual activity: Yes   Other Topics Concern  . None   Social History Narrative  . None    Review of Systems  Respiratory: Negative.   Cardiovascular: Negative.    Objective:  BP 130/78   Pulse (!) 101   Temp 98.4 F (36.9 C) (Oral)   Wt 181 lb 3.2 oz (82.2 kg)   SpO2 95%   BMI 26.95 kg/m   BP/Weight 11/04/2016 10/21/2016 10/02/4313  Systolic BP 400 867 619  Diastolic BP 78 84 82  Wt. (Lbs) 181.2 181.2 178  BMI 26.95 26.95 26.48    Physical Exam  Constitutional: He is oriented to person, place, and time. He appears well-developed. No distress.  HENT:  Head: Normocephalic and atraumatic.  Eyes: Conjunctivae are normal. No scleral icterus.  Neck: Neck supple. No thyromegaly present.  Cardiovascular: Regular rhythm.   Tachycardic. No murmur.  Pulmonary/Chest: Effort normal and breath sounds normal. He has no wheezes. He has no rales.  Lymphadenopathy:    He has no  cervical adenopathy.  Neurological: He is alert and oriented to person, place, and time.  Psychiatric: He has a normal mood and affect.  Vitals reviewed.   Lab Results  Component Value Date   WBC 5.6 10/21/2016   HGB 16.9 10/21/2016   HCT 47.4 10/21/2016   PLT 191 10/21/2016   GLUCOSE 343 (H) 09/25/2016   CHOL 163 09/25/2016   TRIG (H) 09/25/2016    473.0 Triglyceride is over 400; calculations on Lipids are invalid.   HDL 31.30 (L) 09/25/2016   LDLDIRECT 85.0 09/25/2016   ALT 25 09/25/2016   AST 16 09/25/2016   NA 135 09/25/2016   K 4.4 09/25/2016   CL 99 09/25/2016   CREATININE 1.00 09/25/2016   BUN 15 09/25/2016   CO2 30 09/25/2016   HGBA1C 10.7 (H) 09/25/2016    Assessment & Plan:   Problem List Items Addressed This Visit      Cardiovascular and Mediastinum   Coronary atherosclerosis    New problem. Adding aspirin 81 mg daily.       Relevant Medications   aspirin EC 81 MG tablet   Hypertension    At goal currently. We'll continue to monitor closely. Patient wants to wait on adding ACE inhibitor or ARB.      Relevant Medications   aspirin EC 81 MG tablet   Other Relevant Orders   Comprehensive  metabolic panel     Endocrine   DM (diabetes mellitus), type 2 ([place]) - Primary    Improving but not at goal. Continue metformin and Jardiance. Obtaining labs today. If labs okay, will increase Jardiance to 25 mg daily.      Relevant Medications   aspirin EC 81 MG tablet   Other Relevant Orders   Microalbumin / creatinine urine ratio     Other   Polycythemia, secondary   Relevant Orders   CBC     Meds ordered this encounter  Medications  . aspirin EC 81 MG tablet    Sig: Take 1 tablet (81 mg total) by mouth daily.   Follow-up: [place]

## 2016-11-04 NOTE — Patient Instructions (Signed)
I will increase the Jardiance once your labs return.   Continue your medications.  Follow up in Sept (after the 7th).  Take care  Dr. Lacinda Axon

## 2016-11-04 NOTE — Assessment & Plan Note (Signed)
New problem. Adding aspirin 81 mg daily.

## 2016-11-04 NOTE — Assessment & Plan Note (Signed)
At goal currently. We'll continue to monitor closely. Patient wants to wait on adding ACE inhibitor or ARB.
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
## 2016-11-13 ENCOUNTER — Inpatient Hospital Stay (HOSPITAL_BASED_OUTPATIENT_CLINIC_OR_DEPARTMENT_OTHER): Payer: BLUE CROSS/BLUE SHIELD | Admitting: Oncology

## 2016-11-13 ENCOUNTER — Inpatient Hospital Stay: Payer: BLUE CROSS/BLUE SHIELD

## 2016-11-13 VITALS — BP 151/80 | HR 101 | Temp 97.6°F | Resp 20 | Wt 189.2 lb

## 2016-11-13 DIAGNOSIS — D751 Secondary polycythemia: Secondary | ICD-10-CM

## 2016-11-13 DIAGNOSIS — I10 Essential (primary) hypertension: Secondary | ICD-10-CM

## 2016-11-13 DIAGNOSIS — R911 Solitary pulmonary nodule: Secondary | ICD-10-CM

## 2016-11-13 DIAGNOSIS — E119 Type 2 diabetes mellitus without complications: Secondary | ICD-10-CM | POA: Diagnosis not present

## 2016-11-13 DIAGNOSIS — Z79899 Other long term (current) drug therapy: Secondary | ICD-10-CM

## 2016-11-13 DIAGNOSIS — E860 Dehydration: Secondary | ICD-10-CM

## 2016-11-13 LAB — CBC WITH DIFFERENTIAL/PLATELET
Basophils Absolute: 0 10*3/uL (ref 0–0.1)
Basophils Relative: 1 %
EOS ABS: 0.2 10*3/uL (ref 0–0.7)
Eosinophils Relative: 4 %
HEMATOCRIT: 43.4 % (ref 40.0–52.0)
HEMOGLOBIN: 15.5 g/dL (ref 13.0–18.0)
LYMPHS ABS: 1.6 10*3/uL (ref 1.0–3.6)
Lymphocytes Relative: 33 %
MCH: 30.7 pg (ref 26.0–34.0)
MCHC: 35.7 g/dL (ref 32.0–36.0)
MCV: 85.9 fL (ref 80.0–100.0)
MONO ABS: 0.5 10*3/uL (ref 0.2–1.0)
MONOS PCT: 10 %
NEUTROS ABS: 2.6 10*3/uL (ref 1.4–6.5)
NEUTROS PCT: 52 %
Platelets: 192 10*3/uL (ref 150–440)
RBC: 5.05 MIL/uL (ref 4.40–5.90)
RDW: 13 % (ref 11.5–14.5)
WBC: 4.9 10*3/uL (ref 3.8–10.6)

## 2016-11-13 NOTE — Progress Notes (Signed)
Patient denies any concerns today.  

## 2016-11-14 ENCOUNTER — Encounter: Payer: Self-pay | Admitting: Family Medicine

## 2016-11-20 ENCOUNTER — Telehealth: Payer: Self-pay | Admitting: Family Medicine

## 2016-11-20 ENCOUNTER — Emergency Department
Admission: EM | Admit: 2016-11-20 | Discharge: 2016-11-20 | Disposition: A | Discharge status: Home / Self Care | Payer: BLUE CROSS/BLUE SHIELD | Attending: Emergency Medicine | Admitting: Emergency Medicine

## 2016-11-20 ENCOUNTER — Encounter: Payer: Self-pay | Admitting: Emergency Medicine

## 2016-11-20 DIAGNOSIS — Z7984 Long term (current) use of oral hypoglycemic drugs: Secondary | ICD-10-CM | POA: Diagnosis not present

## 2016-11-20 DIAGNOSIS — Y929 Unspecified place or not applicable: Secondary | ICD-10-CM | POA: Insufficient documentation

## 2016-11-20 DIAGNOSIS — E119 Type 2 diabetes mellitus without complications: Secondary | ICD-10-CM | POA: Diagnosis not present

## 2016-11-20 DIAGNOSIS — J45909 Unspecified asthma, uncomplicated: Secondary | ICD-10-CM | POA: Diagnosis not present

## 2016-11-20 DIAGNOSIS — S090XXA Injury of blood vessels of head, not elsewhere classified, initial encounter: Secondary | ICD-10-CM | POA: Diagnosis not present

## 2016-11-20 DIAGNOSIS — R58 Hemorrhage, not elsewhere classified: Secondary | ICD-10-CM

## 2016-11-20 DIAGNOSIS — X58XXXA Exposure to other specified factors, initial encounter: Secondary | ICD-10-CM | POA: Insufficient documentation

## 2016-11-20 DIAGNOSIS — Y999 Unspecified external cause status: Secondary | ICD-10-CM | POA: Insufficient documentation

## 2016-11-20 DIAGNOSIS — I10 Essential (primary) hypertension: Secondary | ICD-10-CM | POA: Insufficient documentation

## 2016-11-20 DIAGNOSIS — Y939 Activity, unspecified: Secondary | ICD-10-CM | POA: Insufficient documentation

## 2016-11-20 DIAGNOSIS — Z7982 Long term (current) use of aspirin: Secondary | ICD-10-CM | POA: Diagnosis not present

## 2016-11-20 MED ORDER — SILVER NITRATE-POT NITRATE 75-25 % EX MISC
CUTANEOUS | Status: AC
  Administered 2016-11-20: 1 via TOPICAL
  Filled 2016-11-20: qty 1

## 2016-11-20 MED ORDER — SILVER NITRATE-POT NITRATE 75-25 % EX MISC
1.0000 | Freq: Once | CUTANEOUS | Status: AC
  Administered 2016-11-20: 1 via TOPICAL

## 2016-11-20 NOTE — Telephone Encounter (Signed)
[name]  DOB: 1950-05-09    Initial Comment Caller says, has a vessel inside his lip burst, an hr ago, it won't stop bleeding    Nurse Assessment  Nurse: Thad Ranger, RN, Langley Gauss Date/Time (Eastern Time): 11/20/2016 2:17:52 PM  Confirm and document reason for call. If symptomatic, describe symptoms. ---Pt has a vessel on the inside of his lower lip that got larger this am and has now burst. States he can not get the bleeding to stop despite constant pressure. Takes Baby ASA qd.  Does the patient have any new or worsening symptoms? ---Yes  Will a triage be completed? ---Yes  Related visit to physician within the last 2 weeks? ---No  Does the PT have any chronic conditions? (i.e. diabetes, asthma, etc.) ---Yes  List chronic conditions. ---Diabetes, Kidney stones  Is this a behavioral health or substance abuse call? 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  Advised to have another adult drive him to the ER.   Referrals  [facility] - ED   Disagree/Comply: Comply

## 2016-11-20 NOTE — Telephone Encounter (Signed)
Pt called and stated that he had a vessel burst on the inside of his lip. Pt states that it has been about an hour and it will not stop bleeding. Sent call to [facility] triage.  Call pt @ [phone]

## 2016-11-20 NOTE — ED Triage Notes (Signed)
Pt c/o ruptured small vessel to lower lip. Pin point size hole noted with mild bleeding present.  No blood thinners. Has had knot here for years that today just burst he reports.  Ambulatory without distress.
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
## 2016-11-20 NOTE — Telephone Encounter (Signed)
Advised patient to go to ED or urgent care. Patient stated he would comply.

## 2016-11-20 NOTE — Discharge Instructions (Signed)
As we discussed if your bleeding recurs please apply pressure if that does not stop the bleeding please return to the emergency department for evaluation. Please follow-up with a dermatologist for evaluation.

## 2016-11-21 ENCOUNTER — Telehealth: Payer: Self-pay | Admitting: Urology

## 2016-11-21 DIAGNOSIS — N200 Calculus of kidney: Secondary | ICD-10-CM

## 2016-11-21 MED ORDER — TAMSULOSIN HCL 0.4 MG PO CAPS: 0.4000 mg | ORAL_CAPSULE | Freq: Every day | ORAL | 3 refills | Status: DC

## 2016-11-21 NOTE — Telephone Encounter (Signed)
Patient called and said that [facility] in [place] is trying to get in touch with [place] about his refill for Flomax.  Can you please investigate?  He has enough to get through the weekend, but will be going out of town soon.

## 2016-11-21 NOTE — Telephone Encounter (Signed)
I do not seen anything for pt. Medication refilled. Pt made aware.

## 2016-11-27 ENCOUNTER — Other Ambulatory Visit: Payer: Self-pay

## 2016-11-27 DIAGNOSIS — N200 Calculus of kidney: Secondary | ICD-10-CM

## 2016-11-27 MED ORDER — TAMSULOSIN HCL 0.4 MG PO CAPS: 0.4000 mg | ORAL_CAPSULE | Freq: Every day | ORAL | 3 refills | Status: DC

## 2016-12-29 ENCOUNTER — Ambulatory Visit: Payer: BLUE CROSS/BLUE SHIELD | Admitting: Family Medicine

## 2016-12-30 ENCOUNTER — Ambulatory Visit (INDEPENDENT_AMBULATORY_CARE_PROVIDER_SITE_OTHER): Payer: BLUE CROSS/BLUE SHIELD | Admitting: Family Medicine

## 2016-12-30 ENCOUNTER — Encounter: Payer: Self-pay | Admitting: Family Medicine

## 2016-12-30 VITALS — BP 130/90 | HR 89 | Temp 98.2°F | Wt 182.2 lb

## 2016-12-30 DIAGNOSIS — E119 Type 2 diabetes mellitus without complications: Secondary | ICD-10-CM

## 2016-12-30 DIAGNOSIS — I10 Essential (primary) hypertension: Secondary | ICD-10-CM

## 2016-12-30 DIAGNOSIS — Z1211 Encounter for screening for malignant neoplasm of colon: Secondary | ICD-10-CM

## 2016-12-30 DIAGNOSIS — Z23 Encounter for immunization: Secondary | ICD-10-CM | POA: Diagnosis not present

## 2016-12-30 LAB — POCT GLYCOSYLATED HEMOGLOBIN (HGB A1C): Hemoglobin A1C: 7

## 2016-12-30 MED ORDER — LOSARTAN POTASSIUM 25 MG PO TABS: 25.0000 mg | ORAL_TABLET | Freq: Every day | ORAL | 3 refills | Status: DC

## 2016-12-30 NOTE — Patient Instructions (Signed)
Lab in 7-10 days.  Continue your meds.  You're doing well.  Follow up in 3 months.  Take care  Dr. Lacinda Axon

## 2016-12-31 NOTE — Assessment & Plan Note (Signed)
Uncontrolled. Adding losartan today.

## 2016-12-31 NOTE — Progress Notes (Signed)
Subjective:  Patient ID: Jonathan Cervantes, male    DOB: 29-Dec-1950  Age: 66 y.o. MRN: [id number]  CC: Follow up  HPI:  66 year old male with HTN, DM-2, Coronary atherosclerosis/Aortic atherosclerosis, BPH, Nephrolithiasis presents for follow up.  DM-2  Mean blood sugar (via his app) of the last 30 days was 160.  Compliant with Metformin and Jardiance.   No reported side effects.  1 episode of hypoglycemia.  Needs A1C.  HTN  BP elevated today. Not go. Patient has declined addition of medication previously.  We'll discuss today.  Preventative health care  Needs colonoscopy. Okay with referral.  Needs Tdap.  Declines flu vaccine.  Social Hx   Social History   Social History  . Marital status: Married    Spouse name: N/A  . Number of children: N/A  . Years of education: N/A   Occupational History  . retired    Social History Main Topics  . Smoking status: Never Smoker  . Smokeless tobacco: Never Used  . Alcohol use No  . Drug use: No  . Sexual activity: Yes   Other Topics Concern  . None   Social History Narrative  . None    Review of Systems  Constitutional: Negative.   Respiratory: Negative.   Cardiovascular: Negative.    Objective:  BP 130/90 (BP Location: Left Arm, Patient Position: Sitting, Cuff Size: Normal)   Pulse 89   Temp 98.2 F (36.8 C) (Oral)   Wt 182 lb 4 oz (82.7 kg)   SpO2 96%   BMI 26.15 kg/m   BP/Weight 12/30/2016 11/20/2016 9/47/0962  Systolic BP 836 629 476  Diastolic BP 90 86 80  Wt. (Lbs) 182.25 190 189.2  BMI 26.15 27.26 28.14   Physical Exam  Constitutional: He is oriented to person, place, and time. He appears well-developed. No distress.  Cardiovascular: Normal rate and regular rhythm.   Pulmonary/Chest: Effort normal and breath sounds normal. He has no wheezes. He has no rales.  Neurological: He is alert and oriented to person, place, and time.  Psychiatric: He has a normal mood and affect.  Vitals  reviewed.   Lab Results  Component Value Date   WBC 4.9 11/13/2016   HGB 15.5 11/13/2016   HCT 43.4 11/13/2016   PLT 192 11/13/2016   GLUCOSE 152 (H) 11/04/2016   CHOL 163 09/25/2016   TRIG (H) 09/25/2016    473.0 Triglyceride is over 400; calculations on Lipids are invalid.   HDL 31.30 (L) 09/25/2016   LDLDIRECT 85.0 09/25/2016   ALT 21 11/04/2016   AST 20 11/04/2016   NA 136 11/04/2016   K 4.4 11/04/2016   CL 103 11/04/2016   CREATININE 1.13 11/04/2016   BUN 18 11/04/2016   CO2 25 11/04/2016   HGBA1C 7.0 12/30/2016   MICROALBUR <0.7 11/04/2016    Assessment & Plan:   Problem List Items Addressed This Visit    Hypertension    Uncontrolled. Adding losartan today.      Relevant Medications   losartan (COZAAR) 25 MG tablet   DM (diabetes mellitus), type 2 (H[facility]) - Primary    A1C 7.0 today (at goal). Continue metformin and Jardiance.       Relevant Medications   losartan (COZAAR) 25 MG tablet   Other Relevant Orders   POCT glycosylated hemoglobin (Hb A1C) (Completed)    Other Visit Diagnoses    Need for diphtheria-tetanus-pertussis (Tdap) vaccine       Encounter for screening colonoscopy  Relevant Orders   Ambulatory referral to Gastroenterology      Meds ordered this encounter  Medications  . losartan (COZAAR) 25 MG tablet    Sig: Take 1 tablet (25 mg total) by mouth daily.    Dispense:  90 tablet    Refill:  3   Follow-up: Return in about 3 months (around 03/31/2017).  A total of 30 minutes were spent face-to-face with the patient during this encounter and over half of that time was spent on counseling regarding BP medication options/benefits in setting of diabetes and nephrolithiasis, need for screening colonoscopy (and other preventative health items - flu shot, Tdap), and diabetes care.  [place]

## 2016-12-31 NOTE — Assessment & Plan Note (Signed)
A1C 7.0 today (at goal). Continue metformin and Jardiance.

## 2017-01-05 ENCOUNTER — Telehealth: Payer: Self-pay | Admitting: Radiology

## 2017-01-05 ENCOUNTER — Encounter: Payer: Self-pay | Admitting: Family Medicine

## 2017-01-05 ENCOUNTER — Other Ambulatory Visit: Payer: Self-pay | Admitting: Family Medicine

## 2017-01-05 DIAGNOSIS — I10 Essential (primary) hypertension: Secondary | ICD-10-CM

## 2017-01-05 NOTE — Telephone Encounter (Signed)
Pt coming in for labs tomorrow, please place future orders. Thank you.  

## 2017-01-06 ENCOUNTER — Other Ambulatory Visit: Payer: Self-pay | Admitting: Family Medicine

## 2017-01-06 ENCOUNTER — Other Ambulatory Visit (INDEPENDENT_AMBULATORY_CARE_PROVIDER_SITE_OTHER): Payer: BLUE CROSS/BLUE SHIELD

## 2017-01-06 DIAGNOSIS — I10 Essential (primary) hypertension: Secondary | ICD-10-CM | POA: Diagnosis not present

## 2017-01-06 LAB — BASIC METABOLIC PANEL
BUN: 21 mg/dL (ref 6–23)
CO2: 26 mEq/L (ref 19–32)
CREATININE: 0.96 mg/dL (ref 0.40–1.50)
Calcium: 9.9 mg/dL (ref 8.4–10.5)
Chloride: 103 mEq/L (ref 96–112)
GFR: 83.75 mL/min (ref >60.00)
Glucose, Bld: 129 mg/dL — ABNORMAL HIGH (ref 70–99)
POTASSIUM: 4.2 meq/L (ref 3.5–5.1)
Sodium: 138 mEq/L (ref 135–145)

## 2017-01-06 MED ORDER — METFORMIN HCL ER 500 MG PO TB24: 1000.0000 mg | ORAL_TABLET | Freq: Two times a day (BID) | ORAL | 0 refills | Status: DC

## 2017-01-07 ENCOUNTER — Encounter: Payer: Self-pay | Admitting: Family Medicine

## 2017-02-04 ENCOUNTER — Other Ambulatory Visit: Payer: Self-pay

## 2017-02-04 ENCOUNTER — Telehealth: Payer: Self-pay

## 2017-02-04 DIAGNOSIS — Z1211 Encounter for screening for malignant neoplasm of colon: Secondary | ICD-10-CM

## 2017-02-04 MED ORDER — NA SULFATE-K SULFATE-MG SULF 17.5-3.13-1.6 GM/177ML PO SOLN
1.0000 | ORAL | 0 refills | Status: DC
Start: 2017-02-04 — End: 2017-02-16

## 2017-02-04 NOTE — Telephone Encounter (Signed)
Gastroenterology Pre-Procedure Review  Request Date:  Requesting Physician: Dr.   PATIENT REVIEW QUESTIONS: The patient responded to the following health history questions as indicated:    1. Are you having any GI issues? no 2. Do you have a personal history of Polyps? no 3. Do you have a family history of Colon Cancer or Polyps? no 4. Diabetes Mellitus? yes (New onset) 5. Joint replacements in the past 12 months?no 6. Major health problems in the past 3 months?no 7. Any artificial heart valves, MVP, or defibrillator?no    MEDICATIONS & ALLERGIES:    Patient reports the following regarding taking any anticoagulation/antiplatelet therapy:   Plavix, Coumadin, Eliquis, Xarelto, Lovenox, Pradaxa, Brilinta, or Effient? no Aspirin? yes (ASA 81mg )  Patient confirms/reports the following medications:  Current Outpatient Prescriptions  Medication Sig Dispense Refill  . aspirin EC 81 MG tablet Take 1 tablet (81 mg total) by mouth daily.    . empagliflozin (JARDIANCE) 10 MG TABS tablet Take 10 mg by mouth daily. 90 tablet 1  . losartan (COZAAR) 25 MG tablet Take 1 tablet (25 mg total) by mouth daily. 90 tablet 3  . metFORMIN (GLUCOPHAGE-XR) 500 MG 24 hr tablet TAKE 2 TABLETS (1,000 MG TOTAL) BY MOUTH 2 (TWO) TIMES DAILY. 360 tablet 0  . metFORMIN (GLUCOPHAGE-XR) 500 MG 24 hr tablet Take 2 tablets (1,000 mg total) by mouth 2 (two) times daily. 360 tablet 0  . Misc Natural Products (URINOZINC PLUS) TABS Take 1 tablet by mouth daily.    . Multiple Vitamins-Minerals (CENTRUM SILVER 50+MEN) TABS Take 1 tablet by mouth daily.    . Potassium Citrate (UROCIT-K 15) 15 MEQ (1620 MG) TBCR Take 1 tablet by mouth 2 (two) times daily. 60 tablet 11  . tamsulosin (FLOMAX) 0.4 MG CAPS capsule Take 1 capsule (0.4 mg total) by mouth daily. after lunch 30 capsule 3   No current facility-administered medications for this visit.     Patient confirms/reports the following allergies:  Allergies  Allergen  Reactions  . Eggs Or Egg-Derived Products Anaphylaxis  . Morphine And Related Anaphylaxis    No orders of the defined types were placed in this encounter.   AUTHORIZATION INFORMATION Primary Insurance: 1D#: Group #:  Secondary Insurance: 1D#: Group #:  SCHEDULE INFORMATION: Date: 02/12/17 Time: Location: [facility]

## 2017-02-10 ENCOUNTER — Telehealth: Payer: Self-pay

## 2017-02-10 MED ORDER — PEG 3350-KCL-NA BICARB-NACL 420 G PO SOLR: 4000.0000 mL | Freq: Once | ORAL | 0 refills | Status: AC

## 2017-02-10 NOTE — Telephone Encounter (Signed)
Advised patient on difference between Suprep and Nulytley.

## 2017-02-11 ENCOUNTER — Encounter: Payer: Self-pay | Admitting: *Deleted

## 2017-02-12 ENCOUNTER — Ambulatory Visit: Payer: BLUE CROSS/BLUE SHIELD | Admitting: Anesthesiology

## 2017-02-12 ENCOUNTER — Encounter
Admission: RE | Disposition: A | Discharge status: Home / Self Care | Payer: Self-pay | Source: Ambulatory Visit | Attending: Gastroenterology

## 2017-02-12 ENCOUNTER — Ambulatory Visit
Admission: RE | Admit: 2017-02-12 | Discharge: 2017-02-12 | Disposition: A | Discharge status: Home / Self Care | Payer: BLUE CROSS/BLUE SHIELD | Source: Ambulatory Visit | Attending: Gastroenterology | Admitting: Gastroenterology

## 2017-02-12 DIAGNOSIS — N400 Enlarged prostate without lower urinary tract symptoms: Secondary | ICD-10-CM | POA: Diagnosis not present

## 2017-02-12 DIAGNOSIS — Z7982 Long term (current) use of aspirin: Secondary | ICD-10-CM | POA: Insufficient documentation

## 2017-02-12 DIAGNOSIS — M1990 Unspecified osteoarthritis, unspecified site: Secondary | ICD-10-CM | POA: Insufficient documentation

## 2017-02-12 DIAGNOSIS — Z1211 Encounter for screening for malignant neoplasm of colon: Secondary | ICD-10-CM | POA: Diagnosis present

## 2017-02-12 DIAGNOSIS — J45909 Unspecified asthma, uncomplicated: Secondary | ICD-10-CM | POA: Insufficient documentation

## 2017-02-12 DIAGNOSIS — E119 Type 2 diabetes mellitus without complications: Secondary | ICD-10-CM | POA: Diagnosis not present

## 2017-02-12 DIAGNOSIS — Z7984 Long term (current) use of oral hypoglycemic drugs: Secondary | ICD-10-CM | POA: Diagnosis not present

## 2017-02-12 DIAGNOSIS — D120 Benign neoplasm of cecum: Secondary | ICD-10-CM | POA: Insufficient documentation

## 2017-02-12 DIAGNOSIS — Z79899 Other long term (current) drug therapy: Secondary | ICD-10-CM | POA: Diagnosis not present

## 2017-02-12 DIAGNOSIS — I10 Essential (primary) hypertension: Secondary | ICD-10-CM | POA: Insufficient documentation

## 2017-02-12 HISTORY — PX: COLONOSCOPY WITH PROPOFOL: SHX5780

## 2017-02-12 LAB — GLUCOSE, CAPILLARY: GLUCOSE-CAPILLARY: 174 mg/dL — AB (ref 65–99)

## 2017-02-12 SURGERY — COLONOSCOPY WITH PROPOFOL
Anesthesia: General

## 2017-02-12 MED ORDER — PROPOFOL 10 MG/ML IV BOLUS
INTRAVENOUS | Status: AC
  Filled 2017-02-12: qty 20

## 2017-02-12 MED ORDER — LIDOCAINE HCL (PF) 2 % IJ SOLN
INTRAMUSCULAR | Status: AC
  Filled 2017-02-12: qty 10

## 2017-02-12 MED ORDER — PROPOFOL 10 MG/ML IV BOLUS
INTRAVENOUS | Status: DC | PRN
  Administered 2017-02-12: 30 mg via INTRAVENOUS

## 2017-02-12 MED ORDER — SODIUM CHLORIDE 0.9 % IV SOLN
INTRAVENOUS | Status: DC
  Administered 2017-02-12: 08:00:00 via INTRAVENOUS

## 2017-02-12 MED ORDER — PROPOFOL 500 MG/50ML IV EMUL
INTRAVENOUS | Status: DC | PRN
  Administered 2017-02-12: 1 ug/kg/min via INTRAVENOUS

## 2017-02-12 MED ORDER — LIDOCAINE HCL (CARDIAC) 20 MG/ML IV SOLN
INTRAVENOUS | Status: DC | PRN
  Administered 2017-02-12: 2 mL via INTRAVENOUS

## 2017-02-12 NOTE — Anesthesia Preprocedure Evaluation (Incomplete Revision)
Anesthesia Evaluation  Patient identified by MRN, date of birth, ID band Patient awake    Reviewed: Allergy & Precautions, NPO status , Patient's Chart, lab work & pertinent test results  Airway Mallampati: II       Dental  (+) Teeth Intact   Pulmonary asthma ,    breath sounds clear to auscultation       Cardiovascular Exercise Tolerance: Good hypertension, Pt. on medications + Peripheral Vascular Disease   Rhythm:Regular Rate:Normal     Neuro/Psych negative neurological ROS     GI/Hepatic negative GI ROS, Neg liver ROS,   Endo/Other  diabetes, Type 2, Oral Hypoglycemic Agents  Renal/GU negative Renal ROS     Musculoskeletal   Abdominal Normal abdominal exam  (+)   Peds negative pediatric ROS (+)  Hematology negative hematology ROS (+)   Anesthesia Other Findings   Reproductive/Obstetrics                             Anesthesia Physical Anesthesia Plan  ASA: II  Anesthesia Plan: General   Post-op Pain Management:    Induction: Intravenous  PONV Risk Score and Plan:   Airway Management Planned: Natural Airway and Nasal Cannula  Additional Equipment:   Intra-op Plan:   Post-operative Plan:   Informed Consent: I have reviewed the patients History and Physical, chart, labs and discussed the procedure including the risks, benefits and alternatives for the proposed anesthesia with the patient or authorized representative who has indicated his/her understanding and acceptance.     Plan Discussed with: Surgeon  Anesthesia Plan Comments:         Anesthesia Quick Evaluation

## 2017-02-12 NOTE — Anesthesia Post-op Follow-up Note (Signed)
Anesthesia QCDR form completed.        

## 2017-02-12 NOTE — Op Note (Addendum)
[facility] [name] Procedure Date: 02/12/2017 8:34 AM MRN: [id number] Account #: [id number] Date of Birth: 05/04/1950 Admit Type: Outpatient Age: 66 Room: [facility] ENDO ROOM 4 Gender: Male Note Status: Finalized Procedure:            Colonoscopy Indications:          Screening for colorectal malignant neoplasm Providers:            [name] MD, MD Referring MD:         Mellody Dance (Referring MD) Medicines:            Monitored Anesthesia Care Complications:        No immediate complications. Procedure:            Pre-Anesthesia Assessment:                       - Prior to the procedure, a History and Physical was                        performed, and patient medications, allergies and                        sensitivities were reviewed. The patient's tolerance of                        previous anesthesia was reviewed.                       - The risks and benefits of the procedure and the                        sedation options and risks were discussed with the                        patient. All questions were answered and informed                        consent was obtained.                       - ASA Grade Assessment: II - A patient with mild                        systemic disease.                       After obtaining informed consent, the colonoscope was                        passed under direct vision. Throughout the procedure,                        the patient's blood pressure, pulse, and oxygen                        saturations were monitored continuously. The                        Colonoscope was introduced through the anus and  advanced to the the cecum, identified by the                        appendiceal orifice, IC valve and transillumination.                        The colonoscopy was performed with ease. The patient                        tolerated the procedure well. The quality of  the bowel                        preparation was good. Findings:      The perianal and digital rectal examinations were normal.      A 3 mm polyp was found in the cecum. The polyp was sessile. The polyp       was removed with a cold biopsy forceps. Resection and retrieval were       complete.      The exam was otherwise without abnormality on direct and retroflexion       views. Impression:           - One 3 mm polyp in the cecum, removed with a cold                        biopsy forceps. Resected and retrieved.                       - The examination was otherwise normal on direct and                        retroflexion views. Recommendation:       - Discharge patient to home (with escort).                       - Resume previous diet.                       - Continue present medications.                       - Await pathology results.                       - Repeat colonoscopy in 5-10 years for surveillance                        based on pathology results. Procedure Code(s):    --- Professional ---                       [phone], Colonoscopy, flexible; with biopsy, single or                        multiple Diagnosis Code(s):    --- Professional ---                       Z12.11, Encounter for screening for malignant neoplasm                        of colon  D12.0, Benign neoplasm of cecum CPT copyright 2016 American Medical Association. All rights reserved. The codes documented in this report are preliminary and upon coder review may  be revised to meet current compliance requirements. [name], MD [name] MD, MD 02/12/2017 9:04:38 AM This report has been signed electronically. Number of Addenda: 0 Note Initiated On: 02/12/2017 8:34 AM Scope Withdrawal Time: 0 hours 19 minutes 44 seconds  Total Procedure Duration: 0 hours 23 minutes 23 seconds       [facility]

## 2017-02-12 NOTE — Anesthesia Preprocedure Evaluation (Signed)
Anesthesia Evaluation  Patient identified by MRN, date of birth, ID band Patient awake    Reviewed: Allergy & Precautions, NPO status , Patient's Chart, lab work & pertinent test results  Airway Mallampati: II       Dental  (+) Teeth Intact   Pulmonary asthma ,    breath sounds clear to auscultation       Cardiovascular Exercise Tolerance: Good hypertension, Pt. on medications + Peripheral Vascular Disease   Rhythm:Regular Rate:Normal     Neuro/Psych negative neurological ROS     GI/Hepatic negative GI ROS, Neg liver ROS,   Endo/Other  diabetes, Type 2, Oral Hypoglycemic Agents  Renal/GU negative Renal ROS     Musculoskeletal   Abdominal Normal abdominal exam  (+)   Peds negative pediatric ROS (+)  Hematology negative hematology ROS (+)   Anesthesia Other Findings   Reproductive/Obstetrics                             Anesthesia Physical Anesthesia Plan  ASA: II  Anesthesia Plan: General   Post-op Pain Management:    Induction: Intravenous  PONV Risk Score and Plan:   Airway Management Planned: Natural Airway and Nasal Cannula  Additional Equipment:   Intra-op Plan:   Post-operative Plan:   Informed Consent: I have reviewed the patients History and Physical, chart, labs and discussed the procedure including the risks, benefits and alternatives for the proposed anesthesia with the patient or authorized representative who has indicated his/her understanding and acceptance.     Plan Discussed with: Surgeon  Anesthesia Plan Comments:         Anesthesia Quick Evaluation

## 2017-02-12 NOTE — Transfer of Care (Signed)
Immediate Anesthesia Transfer of Care Note  Patient: Jonathan Cervantes  Procedure(s) Performed: COLONOSCOPY WITH PROPOFOL (N/A )  Patient Location: PACU  Anesthesia Type:General  Level of Consciousness: awake and alert   Airway & Oxygen Therapy: Patient Spontanous Breathing and Patient connected to nasal cannula oxygen  Post-op Assessment: Report given to RN and Post -op Vital signs reviewed and stable  Post vital signs: Reviewed  Last Vitals:  Vitals:   02/12/17 0713  BP: 140/83  Pulse: 96  Resp: 18  Temp: (!) 36 C  SpO2: 98%    Last Pain:  Vitals:   02/12/17 0713  TempSrc: Tympanic         Complications: No apparent anesthesia complications

## 2017-02-12 NOTE — Anesthesia Postprocedure Evaluation (Signed)
Anesthesia Post Note  Patient: Jonathan Cervantes  Procedure(s) Performed: COLONOSCOPY WITH PROPOFOL (N/A )  Patient location during evaluation: PACU Anesthesia Type: General Level of consciousness: awake Pain management: pain level controlled Vital Signs Assessment: post-procedure vital signs reviewed and stable Respiratory status: spontaneous breathing Cardiovascular status: stable Anesthetic complications: no     Last Vitals:  Vitals:   02/12/17 0926 02/12/17 0936  BP: (!) 136/91 (!) 134/92  Pulse: 79 77  Resp: 10 12  Temp:    SpO2: 97% 98%    Last Pain:  Vitals:   02/12/17 0906  TempSrc: Tympanic                 VAN STAVEREN,Tishawna Larouche
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
## 2017-02-13 ENCOUNTER — Encounter: Payer: Self-pay | Admitting: Gastroenterology

## 2017-02-13 LAB — SURGICAL PATHOLOGY

## 2017-02-15 ENCOUNTER — Encounter: Payer: Self-pay | Admitting: Gastroenterology

## 2017-02-15 DIAGNOSIS — D126 Benign neoplasm of colon, unspecified: Secondary | ICD-10-CM | POA: Insufficient documentation

## 2017-02-15 DIAGNOSIS — E786 Lipoprotein deficiency: Secondary | ICD-10-CM | POA: Insufficient documentation

## 2017-02-15 DIAGNOSIS — E782 Mixed hyperlipidemia: Secondary | ICD-10-CM

## 2017-02-15 DIAGNOSIS — E781 Pure hyperglyceridemia: Secondary | ICD-10-CM | POA: Insufficient documentation

## 2017-02-15 DIAGNOSIS — E1169 Type 2 diabetes mellitus with other specified complication: Secondary | ICD-10-CM | POA: Insufficient documentation
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
## 2017-02-16 ENCOUNTER — Encounter: Payer: Self-pay | Admitting: Family Medicine

## 2017-02-16 ENCOUNTER — Ambulatory Visit (INDEPENDENT_AMBULATORY_CARE_PROVIDER_SITE_OTHER): Payer: BLUE CROSS/BLUE SHIELD | Admitting: Family Medicine

## 2017-02-16 VITALS — BP 146/85 | HR 108 | Ht 71.0 in | Wt 182.5 lb

## 2017-02-16 DIAGNOSIS — E1159 Type 2 diabetes mellitus with other circulatory complications: Secondary | ICD-10-CM | POA: Diagnosis not present

## 2017-02-16 DIAGNOSIS — E1169 Type 2 diabetes mellitus with other specified complication: Secondary | ICD-10-CM | POA: Diagnosis not present

## 2017-02-16 DIAGNOSIS — D126 Benign neoplasm of colon, unspecified: Secondary | ICD-10-CM | POA: Diagnosis not present

## 2017-02-16 DIAGNOSIS — E786 Lipoprotein deficiency: Secondary | ICD-10-CM | POA: Diagnosis not present

## 2017-02-16 DIAGNOSIS — E119 Type 2 diabetes mellitus without complications: Secondary | ICD-10-CM | POA: Diagnosis not present

## 2017-02-16 DIAGNOSIS — E782 Mixed hyperlipidemia: Secondary | ICD-10-CM | POA: Diagnosis not present

## 2017-02-16 DIAGNOSIS — D751 Secondary polycythemia: Secondary | ICD-10-CM

## 2017-02-16 DIAGNOSIS — I152 Hypertension secondary to endocrine disorders: Secondary | ICD-10-CM

## 2017-02-16 DIAGNOSIS — I10 Essential (primary) hypertension: Secondary | ICD-10-CM

## 2017-02-16 DIAGNOSIS — E781 Pure hyperglyceridemia: Secondary | ICD-10-CM

## 2017-02-16 DIAGNOSIS — R911 Solitary pulmonary nodule: Secondary | ICD-10-CM

## 2017-02-16 NOTE — Patient Instructions (Signed)

## 2017-02-16 NOTE — Anesthesia Postprocedure Evaluation (Signed)
Anesthesia Post Note  Patient: Jonathan Cervantes  Procedure(s) Performed: COLONOSCOPY WITH PROPOFOL (N/A )  Patient location during evaluation: PACU Anesthesia Type: General Level of consciousness: awake Pain management: pain level controlled Vital Signs Assessment: post-procedure vital signs reviewed and stable Respiratory status: spontaneous breathing Cardiovascular status: blood pressure returned to baseline Anesthetic complications: no     Last Vitals:  Vitals:   02/12/17 0926 02/12/17 0936  BP: (!) 136/91 (!) 134/92  Pulse: 79 77  Resp: 10 12  Temp:    SpO2: 97% 98%    Last Pain:  Vitals:   02/13/17 0734  TempSrc:   PainSc: 0-No pain                 [name],Jonathan Shurley

## 2017-03-05 ENCOUNTER — Telehealth: Payer: Self-pay | Admitting: Family Medicine

## 2017-03-05 NOTE — Telephone Encounter (Signed)
Patient called and wants to speak with someone clinical. He has been taking Jardiance for the last little bit and over the weekend he started to develop burring and pain while urinating. He stopped taking the Jardiance and after a few days all symptoms cleared up and have not returned. He knows Dr. Jenetta Downer expressed concerns about why he was taking this med and wants to know should he come in and see her about it and wants to know about continuing with the med.

## 2017-03-05 NOTE — Telephone Encounter (Signed)
Please advise if patient needs to make an appointment. Thanks. MPulliam, CMA/RT(R)

## 2017-03-06 NOTE — Telephone Encounter (Signed)
Please call the patient and ask him about this.  He has been on Jardiance from his prior doctor and last prescription was given in July.  So, since he is been on this medicine for so long I doubt the Vania Rea is causing this side effect.  I recommend he contact his urologist about his symptoms as it is not likely his long-term medicine is been on for a while is acutely causing this problem.

## 2017-03-06 NOTE — Telephone Encounter (Signed)
Pt advised of recommendations.  Pt expressed understanding.  Charyl Bigger, CMA

## 2017-03-10 ENCOUNTER — Telehealth: Payer: Self-pay

## 2017-03-10 NOTE — Telephone Encounter (Signed)
Pt left a message on triage line about having issues with medications. Attempted to call pt back and was unsuccessful.

## 2017-03-16 NOTE — Telephone Encounter (Signed)
Spoke with pt in reference to medications questions. Pt stated that he started jardiance to help with his blood sugars. Pt stated that he read on the medication insert given by the pharmacy that a side effect can be dysuria. Pt also stated that he stopped the medication for about a week and the dysuria went away. Pt stated that his PCP advised him to see tx with Dr. Pilar Jarvis. Made pt aware can come in to rule out infection. Pt stated he is currently in [place] and not having dysuria and also not taking jardiance. Pt stated that he will call next week when he returns home for a nurse visit appt. Pt denied n/v, f/c, lower abd pain, back pain at this time.

## 2017-04-01 ENCOUNTER — Other Ambulatory Visit: Payer: Self-pay | Admitting: Family Medicine

## 2017-04-04 ENCOUNTER — Other Ambulatory Visit: Payer: Self-pay | Admitting: Family Medicine

## 2017-04-06 ENCOUNTER — Ambulatory Visit (INDEPENDENT_AMBULATORY_CARE_PROVIDER_SITE_OTHER): Payer: BLUE CROSS/BLUE SHIELD | Admitting: Family Medicine

## 2017-04-06 ENCOUNTER — Encounter: Payer: Self-pay | Admitting: Family Medicine

## 2017-04-06 VITALS — BP 152/92 | HR 96 | Temp 98.2°F | Ht 71.0 in | Wt 183.0 lb

## 2017-04-06 DIAGNOSIS — H9202 Otalgia, left ear: Secondary | ICD-10-CM

## 2017-04-06 DIAGNOSIS — J019 Acute sinusitis, unspecified: Secondary | ICD-10-CM | POA: Diagnosis not present

## 2017-04-06 DIAGNOSIS — R059 Cough, unspecified: Secondary | ICD-10-CM

## 2017-04-06 DIAGNOSIS — R05 Cough: Secondary | ICD-10-CM

## 2017-04-06 DIAGNOSIS — J40 Bronchitis, not specified as acute or chronic: Secondary | ICD-10-CM | POA: Diagnosis not present

## 2017-04-06 MED ORDER — PREDNISONE 20 MG PO TABS: ORAL_TABLET | ORAL | 0 refills | Status: DC

## 2017-04-06 MED ORDER — AMOXICILLIN-POT CLAVULANATE 875-125 MG PO TABS: 1.0000 | ORAL_TABLET | Freq: Two times a day (BID) | ORAL | 0 refills | Status: AC
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
## 2017-04-22 ENCOUNTER — Encounter: Payer: Self-pay | Admitting: Family Medicine

## 2017-04-22 ENCOUNTER — Ambulatory Visit (INDEPENDENT_AMBULATORY_CARE_PROVIDER_SITE_OTHER): Payer: BLUE CROSS/BLUE SHIELD | Admitting: Family Medicine

## 2017-04-22 VITALS — BP 150/90 | HR 104 | Temp 98.2°F | Ht 71.0 in | Wt 179.4 lb

## 2017-04-22 DIAGNOSIS — D582 Other hemoglobinopathies: Secondary | ICD-10-CM | POA: Diagnosis not present

## 2017-04-22 DIAGNOSIS — E119 Type 2 diabetes mellitus without complications: Secondary | ICD-10-CM | POA: Diagnosis not present

## 2017-04-22 DIAGNOSIS — E1169 Type 2 diabetes mellitus with other specified complication: Secondary | ICD-10-CM

## 2017-04-22 DIAGNOSIS — I10 Essential (primary) hypertension: Secondary | ICD-10-CM | POA: Diagnosis not present

## 2017-04-22 DIAGNOSIS — E786 Lipoprotein deficiency: Secondary | ICD-10-CM | POA: Diagnosis not present

## 2017-04-22 DIAGNOSIS — E781 Pure hyperglyceridemia: Secondary | ICD-10-CM | POA: Diagnosis not present

## 2017-04-22 DIAGNOSIS — E1159 Type 2 diabetes mellitus with other circulatory complications: Secondary | ICD-10-CM | POA: Diagnosis not present

## 2017-04-22 DIAGNOSIS — E782 Mixed hyperlipidemia: Secondary | ICD-10-CM | POA: Diagnosis not present

## 2017-04-22 DIAGNOSIS — N529 Male erectile dysfunction, unspecified: Secondary | ICD-10-CM

## 2017-04-22 LAB — POCT GLYCOSYLATED HEMOGLOBIN (HGB A1C): HEMOGLOBIN A1C: 7.8

## 2017-04-22 MED ORDER — METFORMIN HCL 500 MG PO TABS: 1000.0000 mg | ORAL_TABLET | Freq: Two times a day (BID) | ORAL | 3 refills | Status: DC

## 2017-04-22 MED ORDER — CARVEDILOL 6.25 MG PO TABS: 6.2500 mg | ORAL_TABLET | Freq: Two times a day (BID) | ORAL | 3 refills | Status: DC
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
## 2017-04-22 NOTE — Patient Instructions (Addendum)
Told patient to start the carvedilol at one half tab twice daily and monitor blood pressure to a goal of less than 130 and over 80 on a regular basis.  Pulse 62-100 is goal.  Even encourage patient to get a pulse oximeter online for $10 to more accurately monitor his pulse.  If blood pressure is at goal at with 1/2 tablet twice daily, he will stay at that until follow-up in 4 weeks.  Follow-up with me in 4 weeks regarding the new change in your metformin as well as the carvedilol beta-blocker blood pressure medicine.  Please bring in your blood pressure and blood sugar logs.  1 week prior come in for fasting blood work and we will discuss results at your follow-up OV. Follow-up with urology regarding possible testosterone deficiency and ED workup.    -Please check your blood sugars fasting as well as 2-hour postprandial.     We will change her metformin from the 24-hour to the 12-hour tablets.  We are to adding a blood pressure medicine and it would be important to check your blood pressure and pulse couple times a day.     Diabetes Mellitus and Standards of Medical Care  Managing diabetes (diabetes mellitus) can be complicated. Your diabetes treatment may be managed by a team of health care providers, including:  A diet and nutrition specialist (registered dietitian).  A nurse.  A certified diabetes educator (CDE).  A diabetes specialist (endocrinologist).  An eye doctor.  A primary care provider.  A dentist.  Your health care providers follow a schedule in order to help you get the best quality of care. The following schedule is a general guideline for your diabetes management plan. Your health care providers may also give you more specific instructions.  HbA1c (hemoglobin A1c) test This test provides information about blood sugar (glucose) control over the previous 2-3 months. It is used to check whether your diabetes management plan needs to be adjusted.  If you are meeting  your treatment goals, this test is done at least 2 times a year.  If you are not meeting treatment goals or if your treatment goals have changed, this test is done 4 times a year.  Blood pressure test  This test is done at every routine medical visit. For most people, the goal is less than 130/80. Ask your health care provider what your goal blood pressure should be.  Dental and eye exams  Visit your dentist two times a year.  If you have type 1 diabetes, get an eye exam 3-5 years after you are diagnosed, and then once a year after your first exam. ? If you were diagnosed with type 1 diabetes as a child, get an eye exam when you are age 91 or older and have had diabetes for 3-5 years. After the first exam, you should get an eye exam once a year.  If you have type 2 diabetes, have an eye exam as soon as you are diagnosed, and then once a year after your first exam.  Foot care exam  Visual foot exams are done at every routine medical visit. The exams check for cuts, bruises, redness, blisters, sores, or other problems with the feet.  A complete foot exam is done by your health care provider once a year. This exam includes an inspection of the structure and skin of your feet, and a check of the pulses and sensation in your feet. ? Type 1 diabetes: Get your first exam 3-5 years  after diagnosis. ? Type 2 diabetes: Get your first exam as soon as you are diagnosed.  Check your feet every day for cuts, bruises, redness, blisters, or sores. If you have any of these or other problems that are not healing, contact your health care provider.  Kidney function test (urine microalbumin)  This test is done once a year. ? Type 1 diabetes: Get your first test 5 years after diagnosis. ? Type 2 diabetes: Get your first test as soon as you are diagnosed._  If you have chronic kidney disease (CKD), get a serum creatinine and estimated glomerular filtration rate (eGFR) test once a year.  Lipid profile  (cholesterol, HDL, LDL, triglycerides)  This test should be done when you are diagnosed with diabetes, and every 5 years after the first test. If you are on medicines to lower your cholesterol, you may need to get this test done every year. ? The goal for LDL is less than 100 mg/dL (5.5 mmol/L). If you are at high risk, the goal is less than 70 mg/dL (3.9 mmol/L). ? The goal for HDL is 40 mg/dL (2.2 mmol/L) for men and 50 mg/dL(2.8 mmol/L) for women. An HDL cholesterol of 60 mg/dL (3.3 mmol/L) or higher gives some protection against heart disease. ? The goal for triglycerides is less than 150 mg/dL (8.3 mmol/L).  Immunizations  The yearly flu (influenza) vaccine is recommended for everyone 6 months or older who has diabetes.  The pneumonia (pneumococcal) vaccine is recommended for everyone 2 years or older who has diabetes. If you are 21 or older, you may get the pneumonia vaccine as a series of two separate shots.  The hepatitis B vaccine is recommended for adults shortly after they have been diagnosed with diabetes.  The Tdap (tetanus, diphtheria, and pertussis) vaccine should be given: ? According to normal childhood vaccination schedules, for children. ? Every 10 years, for adults who have diabetes.  The shingles vaccine is recommended for people who have had chicken pox and are 50 years or older.  Mental and emotional health  Screening for symptoms of eating disorders, anxiety, and depression is recommended at the time of diagnosis and afterward as needed. If your screening shows that you have symptoms (you have a positive screening result), you may need further evaluation and be referred to a mental health care provider.  Diabetes self-management education  Education about how to manage your diabetes is recommended at diagnosis and ongoing as needed.  Treatment plan  Your treatment plan will be reviewed at every medical visit.  Summary  Managing diabetes (diabetes mellitus)  can be complicated. Your diabetes treatment may be managed by a team of health care providers.  Your health care providers follow a schedule in order to help you get the best quality of care.  Standards of care including having regular physical exams, blood tests, blood pressure monitoring, immunizations, screening tests, and education about how to manage your diabetes.  Your health care providers may also give you more specific instructions based on your individual health.      Type 2 Diabetes Mellitus, Self Care, Adult Caring for yourself after you have been diagnosed with type 2 diabetes (type 2 diabetes mellitus) means keeping your blood sugar (glucose) under control with a balance of:  Nutrition.  Exercise.  Lifestyle changes.  Medicines or insulin, if necessary.  Support from your team of health care providers and others.  The following information explains what you need to know to manage your diabetes at  home. What do I need to do to manage my blood glucose?  Check your blood glucose every day, as often as told by your health care provider.  Contact your health care provider if your blood glucose is above your target for 2 tests in a row.  Have your A1c (hemoglobin A1c) level checked at least two times a year, or as often as told by your health care provider. Your health care provider will set individualized treatment goals for you. Generally, the goal of treatment is to maintain the following blood glucose levels:  Before meals (preprandial): 80-130 mg/dL (4.4-7.2 mmol/L).  After meals (postprandial): below 180 mg/dL (10 mmol/L).  A1c level: less than 7%.  What do I need to know about hyperglycemia and hypoglycemia? What is hyperglycemia? Hyperglycemia, also called high blood glucose, occurs when blood glucose is too high.Make sure you know the early signs of hyperglycemia, such as:  Increased thirst.  Hunger.  Feeling very tired.  Needing to urinate more  often than usual.  Blurry vision.  What is hypoglycemia? Hypoglycemia, also called low blood glucose, occurswith a blood glucose level at or below 70 mg/dL (3.9 mmol/L). The risk for hypoglycemia increases during or after exercise, during sleep, during illness, and when skipping meals or not eating for a long time (fasting). It is important to know the symptoms of hypoglycemia and treat it right away. Always have a 15-gram rapid-acting carbohydrate snack with you to treat low blood glucose. Family members and close friends should also know the symptoms and should understand how to treat hypoglycemia, in case you are not able to treat yourself. What are the symptoms of hypoglycemia? Hypoglycemia symptoms can include:  Hunger.  Anxiety.  Sweating and feeling clammy.  Confusion.  Dizziness or feeling light-headed.  Sleepiness.  Nausea.  Increased heart rate.  Headache.  Blurry vision.  Seizure.  Nightmares.  Tingling or numbness around the mouth, lips, or tongue.  A change in speech.  Decreased ability to concentrate.  A change in coordination.  Restless sleep.  Tremors or shakes.  Fainting.  Irritability.  How do I treat hypoglycemia?  If you are alert and able to swallow safely, follow the 15:15 rule:  Take 15 grams of a rapid-acting carbohydrate. Rapid-acting options include: ? 1 tube of glucose gel. ? 3 glucose pills. ? 6-8 pieces of hard candy. ? 4 oz (120 mL) of fruit juice. ? 4 oz (120 mL) of regular (not diet) soda.  Check your blood glucose 15 minutes after you take the carbohydrate.  If the repeat blood glucose level is still at or below 70 mg/dL (3.9 mmol/L), take 15 grams of a carbohydrate again.  If your blood glucose level does not increase above 70 mg/dL (3.9 mmol/L) after 3 tries, seek emergency medical care.  After your blood glucose level returns to normal, eat a meal or a snack within 1 hour.  How do I treat severe  hypoglycemia? Severe hypoglycemia is when your blood glucose level is at or below 54 mg/dL (3 mmol/L). Severe hypoglycemia is an emergency. Do not wait to see if the symptoms will go away. Get medical help right away. Call your local emergency services (911 in the [place] Do not drive yourself to the hospital. If you have severe hypoglycemia and you cannot eat or drink, you may need an injection of glucagon. A family member or close friend should learn how to check your blood glucose and how to give you a glucagon injection. Ask your health  care provider if you need to have an emergency glucagon injection kit available. Severe hypoglycemia may need to be treated in a hospital. The treatment may include getting glucose through an IV tube. You may also need treatment for the cause of your hypoglycemia. Can having diabetes put me at risk for other conditions? Having diabetes can put you at risk for other long-term (chronic) conditions, such as heart disease and kidney disease. Your health care provider may prescribe medicines to help prevent complications from diabetes. These medicines may include:  Aspirin.  Medicine to lower cholesterol.  Medicine to control blood pressure.  What else can I do to manage my diabetes? Take your diabetes medicines as told  If your health care provider prescribed insulin or diabetes medicines, take them every day.  Do not run out of insulin or other diabetes medicines that you take. Plan ahead so you always have these available.  If you use insulin, adjust your dosage based on how physically active you are and what foods you eat. Your health care provider will tell you how to adjust your dosage. Make healthy food choices  The things that you eat and drink affect your blood glucose and your insulin dosage. Making good choices helps to control your diabetes and prevent other health problems. A healthy meal plan includes eating lean proteins, complex carbohydrates,  fresh fruits and vegetables, low-fat dairy products, and healthy fats. Make an appointment to see a diet and nutrition specialist (registered dietitian) to help you create an eating plan that is right for you. Make sure that you:  Follow instructions from your health care provider about eating or drinking restrictions.  Drink enough fluid to keep your urine clear or pale yellow.  Eat healthy snacks between nutritious meals.  Track the carbohydrates that you eat. Do this by reading food labels and learning the standard serving sizes of foods.  Follow your sick day plan whenever you cannot eat or drink as usual. Make this plan in advance with your health care provider.  Stay active  Exercise regularly, as told by your health care provider. This may include:  Stretching and doing strength exercises, such as yoga or weightlifting, at least 2 times a week.  Doing at least 150 minutes of moderate-intensity or vigorous-intensity exercise each week. This could be brisk walking, biking, or water aerobics. ? Spread out your activity over at least 3 days of the week. ? Do not go more than 2 days in a row without doing some kind of physical activity.  When you start a new exercise or activity, work with your health care provider to adjust your insulin, medicines, or food intake as needed. Make healthy lifestyle choices  Do not use any tobacco products, such as cigarettes, chewing tobacco, and e-cigarettes. If you need help quitting, ask your health care provider.  If your health care provider says that alcohol is safe for you, limit alcohol intake to no more than 1 drink per day for nonpregnant women and 2 drinks per day for men. One drink equals 12 oz of beer, 5 oz of wine, or 1 oz of hard liquor.  Learn to manage stress. If you need help with this, ask your health care provider. Care for your body   Keep your immunizations up to date. In addition to getting vaccinations as told by your  health care provider, it is recommended that you get vaccinated against the following illnesses: ? The flu (influenza). Get a flu shot every year. ?  Pneumonia. ? Hepatitis B.  Schedule an eye exam soon after your diagnosis, and then one time every year after that.  Check your skin and feet every day for cuts, bruises, redness, blisters, or sores. Schedule a foot exam with your health care provider once every year.  Brush your teeth and gums two times a day, and floss at least one time a day. Visit your dentist at least once every 6 months.  Maintain a healthy weight. General instructions  Take over-the-counter and prescription medicines only as told by your health care provider.  Share your diabetes management plan with people in your workplace, school, and household.  Check your urine for ketones when you are ill and as told by your health care provider.  Ask your health care provider: ? Do I need to meet with a diabetes educator? ? Where can I find a support group for people with diabetes?  Carry a medical alert card or wear medical alert jewelry.  Keep all follow-up visits as told by your health care provider. This is important. Where to find more information: For more information about diabetes, visit:  American Diabetes Association (ADA): www.diabetes.org  American Association of Diabetes Educators (AADE): www.diabeteseducator.org/patient-resources  This information is not intended to replace advice given to you by your health care provider. Make sure you discuss any questions you have with your health care provider. Document Released: 07/30/2015 Document Revised: 09/13/2015 Document Reviewed: 05/11/2015 Elsevier Interactive Patient Education  2017 [place].      Blood Glucose Monitoring, Adult Monitoring your blood sugar (glucose) helps you manage your diabetes. It also helps you and your health care provider determine how well your diabetes management plan is  working. Blood glucose monitoring involves checking your blood glucose as often as directed, and keeping a record (log) of your results over time. Why should I monitor my blood glucose? Checking your blood glucose regularly can:  Help you understand how food, exercise, illnesses, and medicines affect your blood glucose.  Let you know what your blood glucose is at any time. You can quickly tell if you are having low blood glucose (hypoglycemia) or high blood glucose (hyperglycemia).  Help you and your health care provider adjust your medicines as needed.  When should I check my blood glucose? Follow instructions from your health care provider about how often to check your blood glucose.   This may depend on:  The type of diabetes you have.  How well-controlled your diabetes is.  Medicines you are taking.  If you have type 1 diabetes:  Check your blood glucose at least 2 times a day.  Also check your blood glucose: ? Before every insulin injection. ? Before and after exercise. ? Between meals. ? 2 hours after a meal. ? Occasionally between 2:00 a.m. and 3:00 a.m., as directed. ? Before potentially dangerous tasks, like driving or using heavy machinery. ? At bedtime.  You may need to check your blood glucose more often, up to 6-10 times a day: ? If you use an insulin pump. ? If you need multiple daily injections (MDI). ? If your diabetes is not well-controlled. ? If you are ill. ? If you have a history of severe hypoglycemia. ? If you have a history of not knowing when your blood glucose is getting low (hypoglycemia unawareness).  If you have type 2 diabetes:  If you take insulin or other diabetes medicines, check your blood glucose at least 2 times a day.  If you are on intensive  insulin therapy, check your blood glucose at least 4 times a day. Occasionally, you may also need to check between 2:00 a.m. and 3:00 a.m., as directed.  Also check your blood glucose: ? Before  and after exercise. ? Before potentially dangerous tasks, like driving or using heavy machinery.  You may need to check your blood glucose more often if: ? Your medicine is being adjusted. ? Your diabetes is not well-controlled. ? You are ill.  What is a blood glucose log?  A blood glucose log is a record of your blood glucose readings. It helps you and your health care provider: ? Look for patterns in your blood glucose over time. ? Adjust your diabetes management plan as needed.  Every time you check your blood glucose, write down your result and notes about things that may be affecting your blood glucose, such as your diet and exercise for the day.  Most glucose meters store a record of glucose readings in the meter. Some meters allow you to download your records to a computer. How do I check my blood glucose? Follow these steps to get accurate readings of your blood glucose: Supplies needed   Blood glucose meter.  Test strips for your meter. Each meter has its own strips. You must use the strips that come with your meter.  A needle to prick your finger (lancet). Do not use lancets more than once.  A device that holds the lancet (lancing device).  A journal or log book to write down your results.  Procedure  Wash your hands with soap and water.  Prick the side of your finger (not the tip) with the lancet. Use a different finger each time.  Gently rub the finger until a small drop of blood appears.  Follow instructions that come with your meter for inserting the test strip, applying blood to the strip, and using your blood glucose meter.  Write down your result and any notes.  Alternative testing sites  Some meters allow you to use areas of your body other than your finger (alternative sites) to test your blood.  If you think you may have hypoglycemia, or if you have hypoglycemia unawareness, do not use alternative sites. Use your finger instead.  Alternative sites  may not be as accurate as the fingers, because blood flow is slower in these areas. This means that the result you get may be delayed, and it may be different from the result that you would get from your finger.  The most common alternative sites are: ? Forearm. ? Thigh. ? Palm of the hand.  Additional tips  Always keep your supplies with you.  If you have questions or need help, all blood glucose meters have a 24-hour "hotline" number that you can call. You may also contact your health care provider.  After you use a few boxes of test strips, adjust (calibrate) your blood glucose meter by following instructions that came with your meter.    The American Diabetes Association suggests the following targets for most nonpregnant adults with diabetes.  More or less stringent glycemic goals may be appropriate for each individual.  A1C: Less than 7% A1C may also be reported as eAG: Less than 154 mg/dl Before a meal (preprandial plasma glucose): 80-130 mg/dl 1-2 hours after beginning of the meal (Postprandial plasma glucose)*: Less than 180 mg/dl  *Postprandial glucose may be targeted if A1C goals are not met despite reaching preprandial glucose goals.   GOALS in short:  The goals  are for the Hgb A1C to be less than 7.0 & blood pressure to be less than 130/80.    It is recommended that all diabetics are educated on and follow a healthy diabetic diet, exercise for 30 minutes 3-4 times per week (walking, biking, swimming, or machine), monitor blood glucose readings and bring that record with you to be reviewed at your next office visit.     You should be checking fasting blood sugars- especially after you eat poorly or eat really healthy, and also check 2 hour postprandial blood sugars after largest meal of the day.    Write these down and bring in your log at each office visit.    You will need to be seen every 3 months by the provider managing your Diabetes unless told otherwise by that  provider.   You will need yearly eye exams from an eye specialist and foot exams to check the nerves of your feet.  Also, your urine should be checked yearly as well to make sure excess protein is not present.   If you are checking your blood pressure at home, please record it and bring it to your next office visit.    Follow the Dietary Approaches to Stop Hypertension (DASH) diet (3 servings of fruit and vegetables daily, whole grains, low sodium, low-fat proteins).  See below.    Lastly, when it comes to your cholesterol, the goal is to have the HDL (good cholesterol) >40, and the LDL (bad cholesterol) <100.   It is recommended that you follow a heart healthy, low saturated and trans-fat diet and exercise for 30 minutes at least 5 times a week.     (( Check out the DASH diet = 1.5 Gram Low Sodium Diet   A 1.5 gram sodium diet restricts the amount of sodium in the diet to no more than 1.5 g or 1500 mg daily.  The American Heart Association recommends Americans over the age of 66 to consume no more than 1500 mg of sodium each day to reduce the risk of developing high blood pressure.  Research also shows that limiting sodium may reduce heart attack and stroke risk.  Many foods contain sodium for flavor and sometimes as a preservative.  When the amount of sodium in a diet needs to be low, it is important to know what to look for when choosing foods and drinks.  The following includes some information and guidelines to help make it easier for you to adapt to a low sodium diet.    QUICK TIPS  Do not add salt to food.  Avoid convenience items and fast food.  Choose unsalted snack foods.  Buy lower sodium products, often labeled as "lower sodium" or "no salt added."  Check food labels to learn how much sodium is in 1 serving.  When eating at a restaurant, ask that your food be prepared with less salt or none, if possible.    READING FOOD LABELS FOR SODIUM INFORMATION  The nutrition facts label is  a good place to find how much sodium is in foods. Look for products with no more than 400 mg of sodium per serving.  Remember that 1.5 g = 1500 mg.  The food label may also list foods as:  Sodium-free: Less than 5 mg in a serving.  Very low sodium: 35 mg or less in a serving.  Low-sodium: 140 mg or less in a serving.  Light in sodium: 50% less sodium in a serving. For example,  if a food that usually has 300 mg of sodium is changed to become light in sodium, it will have 150 mg of sodium.  Reduced sodium: 25% less sodium in a serving. For example, if a food that usually has 400 mg of sodium is changed to reduced sodium, it will have 300 mg of sodium.    CHOOSING FOODS  Grains  Avoid: Salted crackers and snack items. Some cereals, including instant hot cereals. Bread stuffing and biscuit mixes. Seasoned rice or pasta mixes.  Choose: Unsalted snack items. Low-sodium cereals, oats, puffed wheat and rice, shredded wheat. English muffins and bread. Pasta.  Meats  Avoid: Salted, canned, smoked, spiced, pickled meats, including fish and poultry. Bacon, ham, sausage, cold cuts, hot dogs, anchovies.  Choose: Low-sodium canned tuna and salmon. Fresh or frozen meat, poultry, and fish.  Dairy  Avoid: Processed cheese and spreads. Cottage cheese. Buttermilk and condensed milk. Regular cheese.  Choose: Milk. Low-sodium cottage cheese. Yogurt. Sour cream. Low-sodium cheese.  Fruits and Vegetables  Avoid: Regular canned vegetables. Regular canned tomato sauce and paste. Frozen vegetables in sauces. Olives. Angie Fava. Relishes. Sauerkraut.  Choose: Low-sodium canned vegetables. Low-sodium tomato sauce and paste. Frozen or fresh vegetables. Fresh and frozen fruit.  Condiments  Avoid: Canned and packaged gravies. Worcestershire sauce. Tartar sauce. Barbecue sauce. Soy sauce. Steak sauce. Ketchup. Onion, garlic, and table salt. Meat flavorings and tenderizers.  Choose: Fresh and dried herbs and spices. Low-sodium  varieties of mustard and ketchup. Lemon juice. Tabasco sauce. Horseradish.    SAMPLE 1.5 GRAM SODIUM MEAL PLAN:   Breakfast / Sodium (mg)  1 cup low-fat milk / 143 mg  1 whole-wheat English muffin / 240 mg  1 tbs heart-healthy margarine / 153 mg  1 hard-boiled egg / 139 mg  1 small orange / 0 mg  Lunch / Sodium (mg)  1 cup raw carrots / 76 mg  2 tbs no salt added peanut butter / 5 mg  2 slices whole-wheat bread / 270 mg  1 tbs jelly / 6 mg   cup red grapes / 2 mg  Dinner / Sodium (mg)  1 cup whole-wheat pasta / 2 mg  1 cup low-sodium tomato sauce / 73 mg  3 oz lean ground beef / 57 mg  1 small side salad (1 cup raw spinach leaves,  cup cucumber,  cup yellow bell pepper) with 1 tsp olive oil and 1 tsp red wine vinegar / 25 mg  Snack / Sodium (mg)  1 container low-fat vanilla yogurt / 107 mg  3 graham cracker squares / 127 mg  Nutrient Analysis  Calories: 1745  Protein: 75 g  Carbohydrate: 237 g  Fat: 57 g  Sodium: 1425 mg  Document Released: 04/07/2005 Document Revised: 12/18/2010 Document Reviewed: 07/09/2009  ExitCare Patient Information 2012 [place].))    This information is not intended to replace advice given to you by your health care provider. Make sure you discuss any questions you have with your health care provider. Document Released: 04/10/2003 Document Revised: 10/26/2015 Document Reviewed: 09/17/2015 Elsevier Interactive Patient Education  2017 [employer].

## 2017-04-23 ENCOUNTER — Telehealth: Payer: Self-pay | Admitting: Urology

## 2017-04-23 ENCOUNTER — Telehealth: Payer: Self-pay

## 2017-04-23 ENCOUNTER — Encounter: Payer: Self-pay | Admitting: Family Medicine

## 2017-04-23 DIAGNOSIS — N200 Calculus of kidney: Secondary | ICD-10-CM

## 2017-04-23 NOTE — Telephone Encounter (Signed)
Pt pharmacy sent a refill of flomax. I saw in your 09/2016 note pt was supposed to f/u in 2mo for stone formations. Do you want to refill flomax or have him RTC?

## 2017-04-23 NOTE — Telephone Encounter (Signed)
Patient called the office.  He was seen by his PCP.  PCP wants to put him on medication, carvedilol (COREG), beta blocker to regulate his pulse.    She would like to test his testosterone levels, but unsure how this medication would effect the results.  She would like to consult with you prior to starting the medication.  Patient can be reached at [phone].

## 2017-04-24 ENCOUNTER — Encounter (HOSPITAL_COMMUNITY): Payer: Self-pay | Admitting: Emergency Medicine

## 2017-04-24 ENCOUNTER — Emergency Department (HOSPITAL_COMMUNITY)
Admission: EM | Admit: 2017-04-24 | Discharge: 2017-04-24 | Disposition: A | Discharge status: Home / Self Care | Payer: BLUE CROSS/BLUE SHIELD | Attending: Emergency Medicine | Admitting: Emergency Medicine

## 2017-04-24 ENCOUNTER — Emergency Department (HOSPITAL_COMMUNITY): Payer: BLUE CROSS/BLUE SHIELD

## 2017-04-24 DIAGNOSIS — T07XXXA Unspecified multiple injuries, initial encounter: Secondary | ICD-10-CM | POA: Insufficient documentation

## 2017-04-24 DIAGNOSIS — Z7982 Long term (current) use of aspirin: Secondary | ICD-10-CM | POA: Diagnosis not present

## 2017-04-24 DIAGNOSIS — Z955 Presence of coronary angioplasty implant and graft: Secondary | ICD-10-CM | POA: Diagnosis not present

## 2017-04-24 DIAGNOSIS — Z79899 Other long term (current) drug therapy: Secondary | ICD-10-CM | POA: Insufficient documentation

## 2017-04-24 DIAGNOSIS — R51 Headache: Secondary | ICD-10-CM | POA: Insufficient documentation

## 2017-04-24 DIAGNOSIS — Y9389 Activity, other specified: Secondary | ICD-10-CM | POA: Insufficient documentation

## 2017-04-24 DIAGNOSIS — Z7984 Long term (current) use of oral hypoglycemic drugs: Secondary | ICD-10-CM | POA: Diagnosis not present

## 2017-04-24 DIAGNOSIS — Y92410 Unspecified street and highway as the place of occurrence of the external cause: Secondary | ICD-10-CM | POA: Diagnosis not present

## 2017-04-24 DIAGNOSIS — S161XXA Strain of muscle, fascia and tendon at neck level, initial encounter: Secondary | ICD-10-CM | POA: Insufficient documentation

## 2017-04-24 DIAGNOSIS — E119 Type 2 diabetes mellitus without complications: Secondary | ICD-10-CM | POA: Insufficient documentation

## 2017-04-24 DIAGNOSIS — Y998 Other external cause status: Secondary | ICD-10-CM | POA: Insufficient documentation

## 2017-04-24 DIAGNOSIS — J45909 Unspecified asthma, uncomplicated: Secondary | ICD-10-CM | POA: Diagnosis not present

## 2017-04-24 DIAGNOSIS — I10 Essential (primary) hypertension: Secondary | ICD-10-CM | POA: Diagnosis not present

## 2017-04-24 DIAGNOSIS — S199XXA Unspecified injury of neck, initial encounter: Secondary | ICD-10-CM | POA: Diagnosis present

## 2017-04-24 MED ORDER — METHOCARBAMOL 500 MG PO TABS: 500.0000 mg | ORAL_TABLET | Freq: Three times a day (TID) | ORAL | 0 refills | Status: DC | PRN

## 2017-04-24 MED ORDER — METHOCARBAMOL 500 MG PO TABS
1000.0000 mg | ORAL_TABLET | Freq: Once | ORAL | Status: AC
  Administered 2017-04-24: 1000 mg via ORAL
  Filled 2017-04-24: qty 2

## 2017-04-24 MED ORDER — NAPROXEN 500 MG PO TABS: 500.0000 mg | ORAL_TABLET | Freq: Two times a day (BID) | ORAL | 0 refills | Status: DC

## 2017-04-24 MED ORDER — FENTANYL CITRATE (PF) 100 MCG/2ML IJ SOLN
25.0000 ug | Freq: Once | INTRAMUSCULAR | Status: AC
  Administered 2017-04-24: 25 ug via INTRAVENOUS
  Filled 2017-04-24: qty 2

## 2017-04-24 MED ORDER — ONDANSETRON HCL 4 MG/2ML IJ SOLN
4.0000 mg | Freq: Once | INTRAMUSCULAR | Status: AC
  Administered 2017-04-24: 4 mg via INTRAVENOUS
  Filled 2017-04-24: qty 2

## 2017-04-24 NOTE — ED Notes (Signed)
EDP at the bedside.  ?
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
## 2017-04-24 NOTE — ED Notes (Signed)
Patient transported to CT 

## 2017-04-24 NOTE — Discharge Instructions (Signed)
° °  Ice to painful areas tonight.  Robaxin for muscle spasms Naproxen for pain

## 2017-04-24 NOTE — ED Notes (Signed)
Pt unable to sign, hallway patient. Pt verbalized understanding of discharge instructions.
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
## 2017-04-28 ENCOUNTER — Telehealth: Payer: Self-pay

## 2017-04-28 MED ORDER — TAMSULOSIN HCL 0.4 MG PO CAPS: 0.4000 mg | ORAL_CAPSULE | Freq: Every day | ORAL | 3 refills | Status: DC

## 2017-04-28 NOTE — Telephone Encounter (Signed)
Flomax refilled. Pt does not want to repeat litholink.   Pt also voiced concern about BP meds, testosterone, and sexual function. Pt states that PCP wanted testosterone levels drawn as they do not draw them in PCP office. Second problems is sexual function. Pt stated that PCP is concerned that BP meds are affecting his sexual function. Please advise.

## 2017-04-28 NOTE — Telephone Encounter (Signed)
Called the patient to see if the patient is having any symptoms of diarrhea, abd pains, or dehydration.  Patient denies any of these symptoms at this time and will contact the office if he notices any of these.  Patient is keeping a log of blood sugars and states that they are running lower since starting the medication (regular Metformin). MPulliam, CMA/RT(R)

## 2017-05-05 NOTE — Telephone Encounter (Signed)
Spoke with pt in reference to BP meds, testosterone, and needing an appt. Pt voiced frustration with [facility]. Pt then stated that he has made an appt for this Friday to see Dr. Pilar Jarvis.

## 2017-05-08 ENCOUNTER — Encounter: Payer: Self-pay | Admitting: Urology

## 2017-05-08 ENCOUNTER — Ambulatory Visit: Payer: BLUE CROSS/BLUE SHIELD | Admitting: Urology

## 2017-05-08 DIAGNOSIS — N200 Calculus of kidney: Secondary | ICD-10-CM | POA: Diagnosis not present

## 2017-05-08 DIAGNOSIS — N529 Male erectile dysfunction, unspecified: Secondary | ICD-10-CM
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
## 2017-05-18 ENCOUNTER — Other Ambulatory Visit: Payer: BLUE CROSS/BLUE SHIELD

## 2017-05-18 DIAGNOSIS — E1169 Type 2 diabetes mellitus with other specified complication: Secondary | ICD-10-CM

## 2017-05-18 DIAGNOSIS — D582 Other hemoglobinopathies: Secondary | ICD-10-CM

## 2017-05-18 DIAGNOSIS — E782 Mixed hyperlipidemia: Secondary | ICD-10-CM

## 2017-05-19 LAB — LIPID PANEL
CHOL/HDL RATIO: 5.7 ratio — AB (ref 0.0–5.0)
Cholesterol, Total: 164 mg/dL (ref 100–199)
HDL: 29 mg/dL — AB (ref >39)
LDL Calculated: 81 mg/dL (ref 0–99)
TRIGLYCERIDES: 271 mg/dL — AB (ref 0–149)
VLDL Cholesterol Cal: 54 mg/dL — ABNORMAL HIGH (ref 5–40)

## 2017-05-19 LAB — CBC
HEMATOCRIT: 46 % (ref 37.5–51.0)
Hemoglobin: 16.2 g/dL (ref 13.0–17.7)
MCH: 31.5 pg (ref 26.6–33.0)
MCHC: 35.2 g/dL (ref 31.5–35.7)
MCV: 89 fL (ref 79–97)
PLATELETS: 199 10*3/uL (ref 150–379)
RBC: 5.15 x10E6/uL (ref 4.14–5.80)
RDW: 13.8 % (ref 12.3–15.4)
WBC: 5.6 10*3/uL (ref 3.4–10.8)

## 2017-05-21 ENCOUNTER — Other Ambulatory Visit: Payer: Self-pay | Admitting: Urology

## 2017-05-22 ENCOUNTER — Encounter: Payer: Self-pay | Admitting: Urology

## 2017-05-22 ENCOUNTER — Ambulatory Visit (INDEPENDENT_AMBULATORY_CARE_PROVIDER_SITE_OTHER): Payer: BLUE CROSS/BLUE SHIELD | Admitting: Urology

## 2017-05-22 VITALS — BP 143/86 | HR 92 | Ht 71.0 in | Wt 171.0 lb

## 2017-05-22 DIAGNOSIS — N529 Male erectile dysfunction, unspecified: Secondary | ICD-10-CM
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
## 2017-05-25 ENCOUNTER — Encounter: Payer: Self-pay | Admitting: Family Medicine

## 2017-05-25 ENCOUNTER — Ambulatory Visit: Payer: BLUE CROSS/BLUE SHIELD | Admitting: Family Medicine

## 2017-05-25 VITALS — BP 133/81 | HR 99 | Ht 71.0 in | Wt 179.3 lb

## 2017-05-25 DIAGNOSIS — E1159 Type 2 diabetes mellitus with other circulatory complications: Secondary | ICD-10-CM | POA: Diagnosis not present

## 2017-05-25 DIAGNOSIS — E782 Mixed hyperlipidemia: Secondary | ICD-10-CM | POA: Diagnosis not present

## 2017-05-25 DIAGNOSIS — E1169 Type 2 diabetes mellitus with other specified complication: Secondary | ICD-10-CM | POA: Diagnosis not present

## 2017-05-25 DIAGNOSIS — E119 Type 2 diabetes mellitus without complications: Secondary | ICD-10-CM

## 2017-05-25 DIAGNOSIS — E786 Lipoprotein deficiency: Secondary | ICD-10-CM

## 2017-05-25 DIAGNOSIS — I10 Essential (primary) hypertension: Secondary | ICD-10-CM

## 2017-05-25 MED ORDER — LOSARTAN POTASSIUM 100 MG PO TABS: 50.0000 mg | ORAL_TABLET | Freq: Every day | ORAL | 3 refills | Status: DC

## 2017-05-25 MED ORDER — CANAGLIFLOZIN-METFORMIN HCL ER 150-1000 MG PO TB24: 1.0000 | ORAL_TABLET | Freq: Two times a day (BID) | ORAL | 1 refills | Status: DC

## 2017-05-25 NOTE — Patient Instructions (Addendum)
We changed your metformin so please discontinue that and you will take the Invokamet XR twice daily.  Please check 2-hour postprandial and fasting blood sugars and bring in your log in 6 weeks  Also inc losartan to 50m daily-and check your blood pressure.  Goal blood pressure would be 130/80.  If you are not at that after 2-3 weeks of checking it, take the 100 mg/  full tablet once daily.      Diabetes Mellitus and Standards of Medical Care  Managing diabetes (diabetes mellitus) can be complicated. Your diabetes treatment may be managed by a team of health care providers, including:  A diet and nutrition specialist (registered dietitian).  A nurse.  A certified diabetes educator (CDE).  A diabetes specialist (endocrinologist).  An eye doctor.  A primary care provider.  A dentist.  Your health care providers follow a schedule in order to help you get the best quality of care. The following schedule is a general guideline for your diabetes management plan. Your health care providers may also give you more specific instructions.  HbA1c (hemoglobin A1c) test This test provides information about blood sugar (glucose) control over the previous 2-3 months. It is used to check whether your diabetes management plan needs to be adjusted.  If you are meeting your treatment goals, this test is done at least 2 times a year.  If you are not meeting treatment goals or if your treatment goals have changed, this test is done 4 times a year.  Blood pressure test  This test is done at every routine medical visit. For most people, the goal is less than 130/80. Ask your health care provider what your goal blood pressure should be.  Dental and eye exams  Visit your dentist two times a year.  If you have type 1 diabetes, get an eye exam 3-5 years after you are diagnosed, and then once a year after your first exam. ? If you were diagnosed with type 1 diabetes as a child, get an eye exam when you  are age [id number] older and have had diabetes for 3-5 years. After the first exam, you should get an eye exam once a year.  If you have type 2 diabetes, have an eye exam as soon as you are diagnosed, and then once a year after your first exam.  Foot care exam  Visual foot exams are done at every routine medical visit. The exams check for cuts, bruises, redness, blisters, sores, or other problems with the feet.  A complete foot exam is done by your health care provider once a year. This exam includes an inspection of the structure and skin of your feet, and a check of the pulses and sensation in your feet. ? Type 1 diabetes: Get your first exam 3-5 years after diagnosis. ? Type 2 diabetes: Get your first exam as soon as you are diagnosed.  Check your feet every day for cuts, bruises, redness, blisters, or sores. If you have any of these or other problems that are not healing, contact your health care provider.  Kidney function test (urine microalbumin)  This test is done once a year. ? Type 1 diabetes: Get your first test 5 years after diagnosis. ? Type 2 diabetes: Get your first test as soon as you are diagnosed._  If you have chronic kidney disease (CKD), get a serum creatinine and estimated glomerular filtration rate (eGFR) test once a year.  Lipid profile (cholesterol, HDL, LDL, triglycerides)  This test  should be done when you are diagnosed with diabetes, and every 5 years after the first test. If you are on medicines to lower your cholesterol, you may need to get this test done every year. ? The goal for LDL is less than 100 mg/dL (5.5 mmol/L). If you are at high risk, the goal is less than 70 mg/dL (3.9 mmol/L). ? The goal for HDL is 40 mg/dL (2.2 mmol/L) for men and 50 mg/dL(2.8 mmol/L) for women. An HDL cholesterol of 60 mg/dL (3.3 mmol/L) or higher gives some protection against heart disease. ? The goal for triglycerides is less than 150 mg/dL (8.3 mmol/L).  Immunizations  The  yearly flu (influenza) vaccine is recommended for everyone 6 months or older who has diabetes.  The pneumonia (pneumococcal) vaccine is recommended for everyone 2 years or older who has diabetes. If you are 8 or older, you may get the pneumonia vaccine as a series of two separate shots.  The hepatitis B vaccine is recommended for adults shortly after they have been diagnosed with diabetes.  The Tdap (tetanus, diphtheria, and pertussis) vaccine should be given: ? According to normal childhood vaccination schedules, for children. ? Every 10 years, for adults who have diabetes.  The shingles vaccine is recommended for people who have had chicken pox and are 50 years or older.  Mental and emotional health  Screening for symptoms of eating disorders, anxiety, and depression is recommended at the time of diagnosis and afterward as needed. If your screening shows that you have symptoms (you have a positive screening result), you may need further evaluation and be referred to a mental health care provider.  Diabetes self-management education  Education about how to manage your diabetes is recommended at diagnosis and ongoing as needed.  Treatment plan  Your treatment plan will be reviewed at every medical visit.  Summary  Managing diabetes (diabetes mellitus) can be complicated. Your diabetes treatment may be managed by a team of health care providers.  Your health care providers follow a schedule in order to help you get the best quality of care.  Standards of care including having regular physical exams, blood tests, blood pressure monitoring, immunizations, screening tests, and education about how to manage your diabetes.  Your health care providers may also give you more specific instructions based on your individual health.      Type 2 Diabetes Mellitus, Self Care, Adult Caring for yourself after you have been diagnosed with type 2 diabetes (type 2 diabetes mellitus) means  keeping your blood sugar (glucose) under control with a balance of:  Nutrition.  Exercise.  Lifestyle changes.  Medicines or insulin, if necessary.  Support from your team of health care providers and others.  The following information explains what you need to know to manage your diabetes at home. What do I need to do to manage my blood glucose?  Check your blood glucose every day, as often as told by your health care provider.  Contact your health care provider if your blood glucose is above your target for 2 tests in a row.  Have your A1c (hemoglobin A1c) level checked at least two times a year, or as often as told by your health care provider. Your health care provider will set individualized treatment goals for you. Generally, the goal of treatment is to maintain the following blood glucose levels:  Before meals (preprandial): 80-130 mg/dL (4.4-7.2 mmol/L).  After meals (postprandial): below 180 mg/dL (10 mmol/L).  A1c level: less than 7%.  What do I need to know about hyperglycemia and hypoglycemia? What is hyperglycemia? Hyperglycemia, also called high blood glucose, occurs when blood glucose is too high.Make sure you know the early signs of hyperglycemia, such as:  Increased thirst.  Hunger.  Feeling very tired.  Needing to urinate more often than usual.  Blurry vision.  What is hypoglycemia? Hypoglycemia, also called low blood glucose, occurswith a blood glucose level at or below 70 mg/dL (3.9 mmol/L). The risk for hypoglycemia increases during or after exercise, during sleep, during illness, and when skipping meals or not eating for a long time (fasting). It is important to know the symptoms of hypoglycemia and treat it right away. Always have a 15-gram rapid-acting carbohydrate snack with you to treat low blood glucose. Family members and close friends should also know the symptoms and should understand how to treat hypoglycemia, in case you are not able to  treat yourself. What are the symptoms of hypoglycemia? Hypoglycemia symptoms can include:  Hunger.  Anxiety.  Sweating and feeling clammy.  Confusion.  Dizziness or feeling light-headed.  Sleepiness.  Nausea.  Increased heart rate.  Headache.  Blurry vision.  Seizure.  Nightmares.  Tingling or numbness around the mouth, lips, or tongue.  A change in speech.  Decreased ability to concentrate.  A change in coordination.  Restless sleep.  Tremors or shakes.  Fainting.  Irritability.  How do I treat hypoglycemia?  If you are alert and able to swallow safely, follow the 15:15 rule:  Take 15 grams of a rapid-acting carbohydrate. Rapid-acting options include: ? 1 tube of glucose gel. ? 3 glucose pills. ? 6-8 pieces of hard candy. ? 4 oz (120 mL) of fruit juice. ? 4 oz (120 mL) of regular (not diet) soda.  Check your blood glucose 15 minutes after you take the carbohydrate.  If the repeat blood glucose level is still at or below 70 mg/dL (3.9 mmol/L), take 15 grams of a carbohydrate again.  If your blood glucose level does not increase above 70 mg/dL (3.9 mmol/L) after 3 tries, seek emergency medical care.  After your blood glucose level returns to normal, eat a meal or a snack within 1 hour.  How do I treat severe hypoglycemia? Severe hypoglycemia is when your blood glucose level is at or below 54 mg/dL (3 mmol/L). Severe hypoglycemia is an emergency. Do not wait to see if the symptoms will go away. Get medical help right away. Call your local emergency services (911 in the [place] Do not drive yourself to the hospital. If you have severe hypoglycemia and you cannot eat or drink, you may need an injection of glucagon. A family member or close friend should learn how to check your blood glucose and how to give you a glucagon injection. Ask your health care provider if you need to have an emergency glucagon injection kit available. Severe hypoglycemia may need  to be treated in a hospital. The treatment may include getting glucose through an IV tube. You may also need treatment for the cause of your hypoglycemia. Can having diabetes put me at risk for other conditions? Having diabetes can put you at risk for other long-term (chronic) conditions, such as heart disease and kidney disease. Your health care provider may prescribe medicines to help prevent complications from diabetes. These medicines may include:  Aspirin.  Medicine to lower cholesterol.  Medicine to control blood pressure.  What else can I do to manage my diabetes? Take your diabetes medicines as told  If your health care provider prescribed insulin or diabetes medicines, take them every day.  Do not run out of insulin or other diabetes medicines that you take. Plan ahead so you always have these available.  If you use insulin, adjust your dosage based on how physically active you are and what foods you eat. Your health care provider will tell you how to adjust your dosage. Make healthy food choices  The things that you eat and drink affect your blood glucose and your insulin dosage. Making good choices helps to control your diabetes and prevent other health problems. A healthy meal plan includes eating lean proteins, complex carbohydrates, fresh fruits and vegetables, low-fat dairy products, and healthy fats. Make an appointment to see a diet and nutrition specialist (registered dietitian) to help you create an eating plan that is right for you. Make sure that you:  Follow instructions from your health care provider about eating or drinking restrictions.  Drink enough fluid to keep your urine clear or pale yellow.  Eat healthy snacks between nutritious meals.  Track the carbohydrates that you eat. Do this by reading food labels and learning the standard serving sizes of foods.  Follow your sick day plan whenever you cannot eat or drink as usual. Make this plan in advance with  your health care provider.  Stay active  Exercise regularly, as told by your health care provider. This may include:  Stretching and doing strength exercises, such as yoga or weightlifting, at least 2 times a week.  Doing at least 150 minutes of moderate-intensity or vigorous-intensity exercise each week. This could be brisk walking, biking, or water aerobics. ? Spread out your activity over at least 3 days of the week. ? Do not go more than 2 days in a row without doing some kind of physical activity.  When you start a new exercise or activity, work with your health care provider to adjust your insulin, medicines, or food intake as needed. Make healthy lifestyle choices  Do not use any tobacco products, such as cigarettes, chewing tobacco, and e-cigarettes. If you need help quitting, ask your health care provider.  If your health care provider says that alcohol is safe for you, limit alcohol intake to no more than 1 drink per day for nonpregnant women and 2 drinks per day for men. One drink equals 12 oz of beer, 5 oz of wine, or 1 oz of hard liquor.  Learn to manage stress. If you need help with this, ask your health care provider. Care for your body   Keep your immunizations up to date. In addition to getting vaccinations as told by your health care provider, it is recommended that you get vaccinated against the following illnesses: ? The flu (influenza). Get a flu shot every year. ? Pneumonia. ? Hepatitis B.  Schedule an eye exam soon after your diagnosis, and then one time every year after that.  Check your skin and feet every day for cuts, bruises, redness, blisters, or sores. Schedule a foot exam with your health care provider once every year.  Brush your teeth and gums two times a day, and floss at least one time a day. Visit your dentist at least once every 6 months.  Maintain a healthy weight. General instructions  Take over-the-counter and prescription medicines only  as told by your health care provider.  Share your diabetes management plan with people in your workplace, school, and household.  Check your urine for ketones when you are ill  and as told by your health care provider.  Ask your health care provider: ? Do I need to meet with a diabetes educator? ? Where can I find a support group for people with diabetes?  Carry a medical alert card or wear medical alert jewelry.  Keep all follow-up visits as told by your health care provider. This is important. Where to find more information: For more information about diabetes, visit:  American Diabetes Association (ADA): www.diabetes.org  American Association of Diabetes Educators (AADE): www.diabeteseducator.org/patient-resources  This information is not intended to replace advice given to you by your health care provider. Make sure you discuss any questions you have with your health care provider. Document Released: 07/30/2015 Document Revised: 09/13/2015 Document Reviewed: 05/11/2015 Elsevier Interactive Patient Education  2017 [place].      Blood Glucose Monitoring, Adult Monitoring your blood sugar (glucose) helps you manage your diabetes. It also helps you and your health care provider determine how well your diabetes management plan is working. Blood glucose monitoring involves checking your blood glucose as often as directed, and keeping a record (log) of your results over time. Why should I monitor my blood glucose? Checking your blood glucose regularly can:  Help you understand how food, exercise, illnesses, and medicines affect your blood glucose.  Let you know what your blood glucose is at any time. You can quickly tell if you are having low blood glucose (hypoglycemia) or high blood glucose (hyperglycemia).  Help you and your health care provider adjust your medicines as needed.  When should I check my blood glucose? Follow instructions from your health care provider about  how often to check your blood glucose.   This may depend on:  The type of diabetes you have.  How well-controlled your diabetes is.  Medicines you are taking.  If you have type 1 diabetes:  Check your blood glucose at least 2 times a day.  Also check your blood glucose: ? Before every insulin injection. ? Before and after exercise. ? Between meals. ? 2 hours after a meal. ? Occasionally between 2:00 a.m. and 3:00 a.m., as directed. ? Before potentially dangerous tasks, like driving or using heavy machinery. ? At bedtime.  You may need to check your blood glucose more often, up to 6-10 times a day: ? If you use an insulin pump. ? If you need multiple daily injections (MDI). ? If your diabetes is not well-controlled. ? If you are ill. ? If you have a history of severe hypoglycemia. ? If you have a history of not knowing when your blood glucose is getting low (hypoglycemia unawareness).  If you have type 2 diabetes:  If you take insulin or other diabetes medicines, check your blood glucose at least 2 times a day.  If you are on intensive insulin therapy, check your blood glucose at least 4 times a day. Occasionally, you may also need to check between 2:00 a.m. and 3:00 a.m., as directed.  Also check your blood glucose: ? Before and after exercise. ? Before potentially dangerous tasks, like driving or using heavy machinery.  You may need to check your blood glucose more often if: ? Your medicine is being adjusted. ? Your diabetes is not well-controlled. ? You are ill.  What is a blood glucose log?  A blood glucose log is a record of your blood glucose readings. It helps you and your health care provider: ? Look for patterns in your blood glucose over time. ? Adjust your diabetes management  plan as needed.  Every time you check your blood glucose, write down your result and notes about things that may be affecting your blood glucose, such as your diet and exercise for the  day.  Most glucose meters store a record of glucose readings in the meter. Some meters allow you to download your records to a computer. How do I check my blood glucose? Follow these steps to get accurate readings of your blood glucose: Supplies needed   Blood glucose meter.  Test strips for your meter. Each meter has its own strips. You must use the strips that come with your meter.  A needle to prick your finger (lancet). Do not use lancets more than once.  A device that holds the lancet (lancing device).  A journal or log book to write down your results.  Procedure  Wash your hands with soap and water.  Prick the side of your finger (not the tip) with the lancet. Use a different finger each time.  Gently rub the finger until a small drop of blood appears.  Follow instructions that come with your meter for inserting the test strip, applying blood to the strip, and using your blood glucose meter.  Write down your result and any notes.  Alternative testing sites  Some meters allow you to use areas of your body other than your finger (alternative sites) to test your blood.  If you think you may have hypoglycemia, or if you have hypoglycemia unawareness, do not use alternative sites. Use your finger instead.  Alternative sites may not be as accurate as the fingers, because blood flow is slower in these areas. This means that the result you get may be delayed, and it may be different from the result that you would get from your finger.  The most common alternative sites are: ? Forearm. ? Thigh. ? Palm of the hand.  Additional tips  Always keep your supplies with you.  If you have questions or need help, all blood glucose meters have a 24-hour "hotline" number that you can call. You may also contact your health care provider.  After you use a few boxes of test strips, adjust (calibrate) your blood glucose meter by following instructions that came with your  meter.    The American Diabetes Association suggests the following targets for most nonpregnant adults with diabetes.  More or less stringent glycemic goals may be appropriate for each individual.  A1C: Less than 7% A1C may also be reported as eAG: Less than 154 mg/dl Before a meal (preprandial plasma glucose): 80-130 mg/dl 1-2 hours after beginning of the meal (Postprandial plasma glucose)*: Less than 180 mg/dl  *Postprandial glucose may be targeted if A1C goals are not met despite reaching preprandial glucose goals.   GOALS in short:  The goals are for the Hgb A1C to be less than 7.0 & blood pressure to be less than 130/80.    It is recommended that all diabetics are educated on and follow a healthy diabetic diet, exercise for 30 minutes 3-4 times per week (walking, biking, swimming, or machine), monitor blood glucose readings and bring that record with you to be reviewed at your next office visit.     You should be checking fasting blood sugars- especially after you eat poorly or eat really healthy, and also check 2 hour postprandial blood sugars after largest meal of the day.    Write these down and bring in your log at each office visit.    You  will need to be seen every 3 months by the provider managing your Diabetes unless told otherwise by that provider.   You will need yearly eye exams from an eye specialist and foot exams to check the nerves of your feet.  Also, your urine should be checked yearly as well to make sure excess protein is not present.   If you are checking your blood pressure at home, please record it and bring it to your next office visit.    Follow the Dietary Approaches to Stop Hypertension (DASH) diet (3 servings of fruit and vegetables daily, whole grains, low sodium, low-fat proteins).  See below.    Lastly, when it comes to your cholesterol, the goal is to have the HDL (good cholesterol) >40, and the LDL (bad cholesterol) <100.   It is recommended that you  follow a heart healthy, low saturated and trans-fat diet and exercise for 30 minutes at least 5 times a week.     (( Check out the DASH diet = 1.5 Gram Low Sodium Diet   A 1.5 gram sodium diet restricts the amount of sodium in the diet to no more than 1.5 g or 1500 mg daily.  The American Heart Association recommends Americans over the age of 28 to consume no more than 1500 mg of sodium each day to reduce the risk of developing high blood pressure.  Research also shows that limiting sodium may reduce heart attack and stroke risk.  Many foods contain sodium for flavor and sometimes as a preservative.  When the amount of sodium in a diet needs to be low, it is important to know what to look for when choosing foods and drinks.  The following includes some information and guidelines to help make it easier for you to adapt to a low sodium diet.    QUICK TIPS  Do not add salt to food.  Avoid convenience items and fast food.  Choose unsalted snack foods.  Buy lower sodium products, often labeled as "lower sodium" or "no salt added."  Check food labels to learn how much sodium is in 1 serving.  When eating at a restaurant, ask that your food be prepared with less salt or none, if possible.    READING FOOD LABELS FOR SODIUM INFORMATION  The nutrition facts label is a good place to find how much sodium is in foods. Look for products with no more than 400 mg of sodium per serving.  Remember that 1.5 g = 1500 mg.  The food label may also list foods as:  Sodium-free: Less than 5 mg in a serving.  Very low sodium: 35 mg or less in a serving.  Low-sodium: 140 mg or less in a serving.  Light in sodium: 50% less sodium in a serving. For example, if a food that usually has 300 mg of sodium is changed to become light in sodium, it will have 150 mg of sodium.  Reduced sodium: 25% less sodium in a serving. For example, if a food that usually has 400 mg of sodium is changed to reduced sodium, it will have 300 mg  of sodium.    CHOOSING FOODS  Grains  Avoid: Salted crackers and snack items. Some cereals, including instant hot cereals. Bread stuffing and biscuit mixes. Seasoned rice or pasta mixes.  Choose: Unsalted snack items. Low-sodium cereals, oats, puffed wheat and rice, shredded wheat. English muffins and bread. Pasta.  Meats  Avoid: Salted, canned, smoked, spiced, pickled meats, including fish and poultry.  Bacon, ham, sausage, cold cuts, hot dogs, anchovies.  Choose: Low-sodium canned tuna and salmon. Fresh or frozen meat, poultry, and fish.  Dairy  Avoid: Processed cheese and spreads. Cottage cheese. Buttermilk and condensed milk. Regular cheese.  Choose: Milk. Low-sodium cottage cheese. Yogurt. Sour cream. Low-sodium cheese.  Fruits and Vegetables  Avoid: Regular canned vegetables. Regular canned tomato sauce and paste. Frozen vegetables in sauces. Olives. Angie Fava. Relishes. Sauerkraut.  Choose: Low-sodium canned vegetables. Low-sodium tomato sauce and paste. Frozen or fresh vegetables. Fresh and frozen fruit.  Condiments  Avoid: Canned and packaged gravies. Worcestershire sauce. Tartar sauce. Barbecue sauce. Soy sauce. Steak sauce. Ketchup. Onion, garlic, and table salt. Meat flavorings and tenderizers.  Choose: Fresh and dried herbs and spices. Low-sodium varieties of mustard and ketchup. Lemon juice. Tabasco sauce. Horseradish.    SAMPLE 1.5 GRAM SODIUM MEAL PLAN:   Breakfast / Sodium (mg)  1 cup low-fat milk / 143 mg  1 whole-wheat English muffin / 240 mg  1 tbs heart-healthy margarine / 153 mg  1 hard-boiled egg / 139 mg  1 small orange / 0 mg  Lunch / Sodium (mg)  1 cup raw carrots / 76 mg  2 tbs no salt added peanut butter / 5 mg  2 slices whole-wheat bread / 270 mg  1 tbs jelly / 6 mg   cup red grapes / 2 mg  Dinner / Sodium (mg)  1 cup whole-wheat pasta / 2 mg  1 cup low-sodium tomato sauce / 73 mg  3 oz lean ground beef / 57 mg  1 small side salad (1 cup raw spinach  leaves,  cup cucumber,  cup yellow bell pepper) with 1 tsp olive oil and 1 tsp red wine vinegar / 25 mg  Snack / Sodium (mg)  1 container low-fat vanilla yogurt / 107 mg  3 graham cracker squares / 127 mg  Nutrient Analysis  Calories: 1745  Protein: 75 g  Carbohydrate: 237 g  Fat: 57 g  Sodium: 1425 mg  Document Released: 04/07/2005 Document Revised: 12/18/2010 Document Reviewed: 07/09/2009  ExitCare Patient Information 2012 [place].))    This information is not intended to replace advice given to you by your health care provider. Make sure you discuss any questions you have with your health care provider. Document Released: 04/10/2003 Document Revised: 10/26/2015 Document Reviewed: 09/17/2015 Elsevier Interactive Patient Education  2017 [employer].
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
## 2017-05-26 ENCOUNTER — Telehealth: Payer: Self-pay | Admitting: Family Medicine

## 2017-05-26 NOTE — Telephone Encounter (Signed)
Jonathan Cervantes From [facility] at [phone] says pt needs clarification when to stop the [place] ???) & when to start the   Canagliflozin-Metformin HCl ER (INVOKAMET XR) [phone] MG TB24 [[id number]]  Order Details  Dose: 1 tablet Route: Oral Frequency: 2 times daily  Indications of Use: Type 2 Diabetes Mellitus  Dispense Quantity: 180 tablet Refills: 1 Fills remaining: --        Sig: Take 1 tablet by mouth 2 (two) times daily.     Pt uses:   [id number] [place], [place] [address] [place] #:  [id number]   Please call [phone] --glh

## 2017-05-26 NOTE — Telephone Encounter (Signed)
Called and spoke to the Pharmacy.  Patient to stop Metformin only and start new combo pill. MPulliam, CMA/RT(R)

## 2017-05-26 NOTE — Telephone Encounter (Signed)
Pharmacy called needing order clarification on med refills. Please call [facility] back at [phone]

## 2017-06-25 ENCOUNTER — Encounter: Payer: Self-pay | Admitting: Family Medicine

## 2017-06-25 ENCOUNTER — Ambulatory Visit (INDEPENDENT_AMBULATORY_CARE_PROVIDER_SITE_OTHER): Payer: BLUE CROSS/BLUE SHIELD | Admitting: Family Medicine

## 2017-06-25 VITALS — BP 148/92 | HR 88 | Ht 71.0 in | Wt 178.7 lb

## 2017-06-25 DIAGNOSIS — R911 Solitary pulmonary nodule: Secondary | ICD-10-CM | POA: Diagnosis not present

## 2017-06-25 DIAGNOSIS — E1159 Type 2 diabetes mellitus with other circulatory complications: Secondary | ICD-10-CM | POA: Diagnosis not present

## 2017-06-25 DIAGNOSIS — E782 Mixed hyperlipidemia: Secondary | ICD-10-CM

## 2017-06-25 DIAGNOSIS — N529 Male erectile dysfunction, unspecified: Secondary | ICD-10-CM | POA: Diagnosis not present

## 2017-06-25 DIAGNOSIS — I152 Hypertension secondary to endocrine disorders: Secondary | ICD-10-CM

## 2017-06-25 DIAGNOSIS — E119 Type 2 diabetes mellitus without complications: Secondary | ICD-10-CM | POA: Diagnosis not present

## 2017-06-25 DIAGNOSIS — I10 Essential (primary) hypertension: Secondary | ICD-10-CM

## 2017-06-25 DIAGNOSIS — E278 Other specified disorders of adrenal gland: Secondary | ICD-10-CM

## 2017-06-25 DIAGNOSIS — E279 Disorder of adrenal gland, unspecified: Secondary | ICD-10-CM

## 2017-06-25 DIAGNOSIS — E1169 Type 2 diabetes mellitus with other specified complication: Secondary | ICD-10-CM

## 2017-06-25 MED ORDER — LOSARTAN POTASSIUM 100 MG PO TABS: 100.0000 mg | ORAL_TABLET | Freq: Every day | ORAL | 3 refills | Status: DC

## 2017-06-25 MED ORDER — LIRAGLUTIDE 18 MG/3ML ~~LOC~~ SOPN: PEN_INJECTOR | SUBCUTANEOUS | 2 refills | Status: DC
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
## 2017-06-25 NOTE — Patient Instructions (Signed)
Risk factors for prediabetes and type 2 diabetes  Researchers don't fully understand why some people develop prediabetes and type 2 diabetes and others don't.  It's clear that certain factors increase the risk, however, including:  Weight. The more fatty tissue you have, the more resistant your cells become to insulin.  Inactivity. The less active you are, the greater your risk. Physical activity helps you control your weight, uses up glucose as energy and makes your cells more sensitive to insulin.  Family history. Your risk increases if a parent or sibling has type 2 diabetes.  Race. Although it's unclear why, people of certain races - including blacks, Hispanics, American Indians and Asian-Americans - are at higher risk.  Age. Your risk increases as you get older. This may be because you tend to exercise less, lose muscle mass and gain weight as you age. But type 2 diabetes is also increasing dramatically among children, adolescents and younger adults.  Gestational diabetes. If you developed gestational diabetes when you were pregnant, your risk of developing prediabetes and type 2 diabetes later increases. If you gave birth to a baby weighing more than 9 pounds (4 kilograms), you're also at risk of type 2 diabetes.  Polycystic ovary syndrome. For women, having polycystic ovary syndrome - a common condition characterized by irregular menstrual periods, excess hair growth and obesity - increases the risk of diabetes.  High blood pressure. Having blood pressure over 140/90 millimeters of mercury (mm Hg) is linked to an increased risk of type 2 diabetes.  Abnormal cholesterol and triglyceride levels. If you have low levels of high-density lipoprotein (HDL), or "good," cholesterol, your risk of type 2 diabetes is higher. Triglycerides are another type of fat carried in the blood. People with high levels of triglycerides have an increased risk of type 2 diabetes. Your doctor can let you know what your  cholesterol and triglyceride levels are.  A good guide to good carbs: The glycemic index ---If you have diabetes, or at risk for diabetes, you know all too well that when you eat carbohydrates, your blood sugar goes up. The total amount of carbs you consume at a meal or in a snack mostly determines what your blood sugar will do. But the food itself also plays a role. A serving of white rice has almost the same effect as eating pure table sugar - a quick, high spike in blood sugar. A serving of lentils has a slower, smaller effect.  ---Picking good sources of carbs can help you control your blood sugar and your weight. Even if you don't have diabetes, eating healthier carbohydrate-rich foods can help ward off a host of chronic conditions, from heart disease to various cancers to, well, diabetes.  ---One way to choose foods is with the glycemic index (GI). This tool measures how much a food boosts blood sugar.  The glycemic index rates the effect of a specific amount of a food on blood sugar compared with the same amount of pure glucose. A food with a glycemic index of 28 boosts blood sugar only 28% as much as pure glucose. One with a GI of 95 acts like pure glucose.    High glycemic foods result in a quick spike in insulin and blood sugar (also known as blood glucose).  Low glycemic foods have a slower, smaller effect- these are healthier for you.   Using the glycemic index Using the glycemic index is easy: choose foods in the low GI category instead of those in the high  GI category (see below), and go easy on those in between. Low glycemic index (GI of 55 or less): Most fruits and vegetables, beans, minimally processed grains, pasta, low-fat dairy foods, and nuts.  Moderate glycemic index (GI 56 to 69): White and sweet potatoes, corn, white rice, couscous, breakfast cereals such as Cream of Wheat and Mini Wheats.  High glycemic index (GI of 70 or higher): White bread, rice cakes, most crackers,  bagels, cakes, doughnuts, croissants, most packaged breakfast cereals. You can see the values for 100 commons foods and get links to more at www.health.CheapToothpicks.si.  Swaps for lowering glycemic index  Instead of this high-glycemic index food Eat this lower-glycemic index food  White rice Brown rice or converted rice  Instant oatmeal Steel-cut oats  Cornflakes Bran flakes  Baked potato Pasta, bulgur  White bread Whole-grain bread  Corn Peas or leafy greens       Prediabetes Eating Plan  Prediabetes--also called impaired glucose tolerance or impaired fasting glucose--is a condition that causes blood sugar (blood glucose) levels to be higher than normal. Following a healthy diet can help to keep prediabetes under control. It can also help to lower the risk of type 2 diabetes and heart disease, which are increased in people who have prediabetes. Along with regular exercise, a healthy diet:  Promotes weight loss.  Helps to control blood sugar levels.  Helps to improve the way that the body uses insulin.   WHAT DO I NEED TO KNOW ABOUT THIS EATING PLAN?   Use the glycemic index (GI) to plan your meals. The index tells you how quickly a food will raise your blood sugar. Choose low-GI foods. These foods take a longer time to raise blood sugar.  Pay close attention to the amount of carbohydrates in the food that you eat. Carbohydrates increase blood sugar levels.  Keep track of how many calories you take in. Eating the right amount of calories will help you to achieve a healthy weight. Losing about 7 percent of your starting weight can help to prevent type 2 diabetes.  You may want to follow a Mediterranean diet. This diet includes a lot of vegetables, lean meats or fish, whole grains, fruits, and healthy oils and fats.   WHAT FOODS CAN I EAT?  Grains Whole grains, such as whole-wheat or whole-grain breads, crackers, cereals, and pasta. Unsweetened oatmeal. Bulgur. Barley.  Quinoa. Brown rice. Corn or whole-wheat flour tortillas or taco shells. Vegetables Lettuce. Spinach. Peas. Beets. Cauliflower. Cabbage. Broccoli. Carrots. Tomatoes. Squash. Eggplant. Herbs. Peppers. Onions. Cucumbers. Brussels sprouts. Fruits Berries. Bananas. Apples. Oranges. Grapes. Papaya. Mango. Pomegranate. Kiwi. Grapefruit. Cherries. Meats and Other Protein Sources Seafood. Lean meats, such as chicken and [place] or lean cuts of pork and beef. Tofu. Eggs. Nuts. Beans. Dairy Low-fat or fat-free dairy products, such as yogurt, cottage cheese, and cheese. Beverages Water. Tea. Coffee. Sugar-free or diet soda. Seltzer water. Milk. Milk alternatives, such as soy or almond milk. Condiments Mustard. Relish. Low-fat, low-sugar ketchup. Low-fat, low-sugar barbecue sauce. Low-fat or fat-free mayonnaise. Sweets and Desserts Sugar-free or low-fat pudding. Sugar-free or low-fat ice cream and other frozen treats. Fats and Oils Avocado. Walnuts. Olive oil. The items listed above may not be a complete list of recommended foods or beverages. Contact your dietitian for more options.    WHAT FOODS ARE NOT RECOMMENDED?  Grains Refined white flour and flour products, such as bread, pasta, snack foods, and cereals. Beverages Sweetened drinks, such as sweet iced tea and soda. Sweets and Desserts  Baked goods, such as cake, cupcakes, pastries, cookies, and cheesecake. The items listed above may not be a complete list of foods and beverages to avoid. Contact your dietitian for more information.   This information is not intended to replace advice given to you by your health care provider. Make sure you discuss any questions you have with your health care provider.   Document Released: 08/22/2014 Document Reviewed: 08/22/2014 Elsevier Interactive Patient Education 2016 [employer].   -d

## 2017-06-26 ENCOUNTER — Other Ambulatory Visit: Payer: Self-pay

## 2017-06-26 DIAGNOSIS — E278 Other specified disorders of adrenal gland: Secondary | ICD-10-CM

## 2017-06-29 ENCOUNTER — Telehealth: Payer: Self-pay | Admitting: Family Medicine

## 2017-06-29 NOTE — Telephone Encounter (Signed)
Please advise if the patient should increase the amount on Thursday or continue on the lower dose. MPulliam, CMA/RT(R)

## 2017-06-29 NOTE — Telephone Encounter (Signed)
Spoke to Dr. Raliegh Scarlet and was instructed to inform the patient that if BS stays in the current range to stay at the current dose of Victoza, patient is not to increase on Thursday.  Patient expressed understanding. MPulliam, CMA/RT(R)

## 2017-06-29 NOTE — Telephone Encounter (Signed)
Please call patient to tell him I would like him to follow-up with me as soon as he is next available, so that we can discuss further workup of his lung and adrenal nodule.   Especially before his planned follow-up imaging on the 27th, as I would like to discuss with him possibly changing the planned imaging study.

## 2017-06-29 NOTE — Telephone Encounter (Signed)
Patient made appt for 07/07/2017 with Dr. Raliegh Scarlet. MPulliam, CMA/RT(R)

## 2017-06-29 NOTE — Telephone Encounter (Signed)
Patient started Victoza on Thursday of last week and said his BS dropped to 105 in about an hour and he has monitored it up to this point and all the readings have been between 101-105. He was told to increase the amount he take this coming Thursday but has reservations about it since it already took a drastic hit. He wants to speak to someone clinical about this and what he should do.

## 2017-07-07 ENCOUNTER — Telehealth: Payer: Self-pay | Admitting: Family Medicine

## 2017-07-07 ENCOUNTER — Ambulatory Visit (INDEPENDENT_AMBULATORY_CARE_PROVIDER_SITE_OTHER): Payer: BLUE CROSS/BLUE SHIELD | Admitting: Family Medicine

## 2017-07-07 ENCOUNTER — Encounter: Payer: Self-pay | Admitting: Family Medicine

## 2017-07-07 VITALS — BP 130/77 | HR 80 | Ht 71.0 in | Wt 178.0 lb

## 2017-07-07 DIAGNOSIS — R911 Solitary pulmonary nodule: Secondary | ICD-10-CM

## 2017-07-07 DIAGNOSIS — E119 Type 2 diabetes mellitus without complications: Secondary | ICD-10-CM | POA: Diagnosis not present
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
## 2017-07-07 NOTE — Telephone Encounter (Signed)
Please see A/P portion of pt's OV note today for further details.    After my discussion with pt on phone today around 5:20-5:30 and discussion R/B of f/up with CT sx vs surveillence with repeat LDCT scan of lung nodule.   - Ordered f/up LDCT scan for near future.   - all pt's Q's answered and he expressed appreciation for all of my efforts today and talking with several docs.   He will keep his regular f/up OV with me in near future for his DM

## 2017-07-07 NOTE — Telephone Encounter (Signed)
Patient states Dr. Raliegh Scarlet called him , left message for him to call office ---she was unavailable so was medical assistant--forwarding msg patient returned called.  --glh

## 2017-07-10 ENCOUNTER — Ambulatory Visit: Payer: BLUE CROSS/BLUE SHIELD

## 2017-07-10 ENCOUNTER — Other Ambulatory Visit: Payer: BLUE CROSS/BLUE SHIELD

## 2017-07-15 ENCOUNTER — Other Ambulatory Visit: Payer: BLUE CROSS/BLUE SHIELD

## 2017-07-15 ENCOUNTER — Other Ambulatory Visit: Payer: Self-pay

## 2017-07-15 ENCOUNTER — Ambulatory Visit: Payer: BLUE CROSS/BLUE SHIELD

## 2017-07-16 ENCOUNTER — Telehealth: Payer: Self-pay | Admitting: Family Medicine

## 2017-07-16 NOTE — Telephone Encounter (Signed)
Had to do a peer to peer review with [employer].  Spoke with Dr. Cameron Ali regarding this.    I explained this patient is doing to be reviewed all prior CT scans as well as CT PET scans with her.  Gave her detailed information about size of mass etc.  Explained that I had spoken to interventional radiologist following the most recent PET scan and they did not want to proceed with biopsy at this time.  After this discussion with the specialist, we decided to proceed with CT chest for routine surveillance of this right lobar mass.  Explained to the reviewing physician that I have recently taken over this patient's care and that is why there has been a lapse in his surveillance screening tests.    -Study was approved to be performed at [facility] imaging any time between July 15, 2017 and August 13, 2017.    The reference number was [id number].

## 2017-07-17 ENCOUNTER — Ambulatory Visit
Admission: RE | Admit: 2017-07-17 | Discharge: 2017-07-17 | Disposition: A | Discharge status: Home / Self Care | Payer: BLUE CROSS/BLUE SHIELD | Source: Ambulatory Visit | Attending: Family Medicine | Admitting: Family Medicine

## 2017-07-17 DIAGNOSIS — R911 Solitary pulmonary nodule: Secondary | ICD-10-CM

## 2017-07-23 ENCOUNTER — Encounter: Payer: Self-pay | Admitting: Family Medicine

## 2017-08-03 ENCOUNTER — Other Ambulatory Visit: Payer: Self-pay

## 2017-08-03 ENCOUNTER — Telehealth: Payer: Self-pay | Admitting: Family Medicine

## 2017-08-03 DIAGNOSIS — E1159 Type 2 diabetes mellitus with other circulatory complications: Secondary | ICD-10-CM

## 2017-08-03 DIAGNOSIS — I152 Hypertension secondary to endocrine disorders: Secondary | ICD-10-CM

## 2017-08-03 DIAGNOSIS — I10 Essential (primary) hypertension: Principal | ICD-10-CM

## 2017-08-03 MED ORDER — LOSARTAN POTASSIUM 100 MG PO TABS: 100.0000 mg | ORAL_TABLET | Freq: Every day | ORAL | 3 refills | Status: DC

## 2017-08-03 NOTE — Telephone Encounter (Signed)
Refill sent into the pharmacy. MPulliam, CMA/RT(R)  

## 2017-08-03 NOTE — Telephone Encounter (Signed)
Patient called refill request on the losartan - Reviewed that chart; medication was changed by Dr. Raliegh Scarlet from 1/2 tablet to 1 tablet daily sent in new RX for the patient. MPulliam, CMA/RT(R)

## 2017-08-03 NOTE — Telephone Encounter (Signed)
Patient is requesting a refill of his losartan, he was increased from a half tab to a whole tab so he has finished his refill earlier than normal and now needs a new refill request sent to [facility] on [address] in [place]

## 2017-08-04 ENCOUNTER — Other Ambulatory Visit: Payer: Self-pay

## 2017-08-04 DIAGNOSIS — I152 Hypertension secondary to endocrine disorders: Secondary | ICD-10-CM

## 2017-08-04 DIAGNOSIS — I10 Essential (primary) hypertension: Principal | ICD-10-CM

## 2017-08-04 DIAGNOSIS — E1159 Type 2 diabetes mellitus with other circulatory complications: Secondary | ICD-10-CM

## 2017-08-04 LAB — HM DIABETES EYE EXAM

## 2017-08-04 LAB — HM DIABETES FOOT EXAM

## 2017-08-04 MED ORDER — LOSARTAN POTASSIUM 100 MG PO TABS: 100.0000 mg | ORAL_TABLET | Freq: Every day | ORAL | 3 refills | Status: DC

## 2017-08-06 ENCOUNTER — Ambulatory Visit (INDEPENDENT_AMBULATORY_CARE_PROVIDER_SITE_OTHER): Payer: BLUE CROSS/BLUE SHIELD | Admitting: Family Medicine

## 2017-08-06 ENCOUNTER — Encounter: Payer: Self-pay | Admitting: Family Medicine

## 2017-08-06 VITALS — BP 119/74 | HR 97 | Ht 71.0 in | Wt 179.5 lb

## 2017-08-06 DIAGNOSIS — E782 Mixed hyperlipidemia: Secondary | ICD-10-CM

## 2017-08-06 DIAGNOSIS — E1159 Type 2 diabetes mellitus with other circulatory complications: Secondary | ICD-10-CM

## 2017-08-06 DIAGNOSIS — E1169 Type 2 diabetes mellitus with other specified complication: Secondary | ICD-10-CM

## 2017-08-06 DIAGNOSIS — E1165 Type 2 diabetes mellitus with hyperglycemia: Secondary | ICD-10-CM | POA: Diagnosis not present

## 2017-08-06 DIAGNOSIS — Z79899 Other long term (current) drug therapy: Secondary | ICD-10-CM | POA: Diagnosis not present

## 2017-08-06 DIAGNOSIS — E786 Lipoprotein deficiency: Secondary | ICD-10-CM

## 2017-08-06 DIAGNOSIS — I10 Essential (primary) hypertension: Secondary | ICD-10-CM | POA: Diagnosis not present

## 2017-08-06 LAB — POCT GLYCOSYLATED HEMOGLOBIN (HGB A1C): HEMOGLOBIN A1C: 6.3

## 2017-08-06 NOTE — Patient Instructions (Signed)
Congratulations on such great numbers with your blood pressure and blood sugars.  Keep up the great work.  We will see you in 4 months unless something changes.
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
## 2017-08-07 LAB — BASIC METABOLIC PANEL
BUN/Creatinine Ratio: 16 (ref 10–24)
BUN: 20 mg/dL (ref 8–27)
CALCIUM: 9.8 mg/dL (ref 8.6–10.2)
CO2: 24 mmol/L (ref 20–29)
CREATININE: 1.28 mg/dL — AB (ref 0.76–1.27)
Chloride: 103 mmol/L (ref 96–106)
GFR calc Af Amer: 68 mL/min/{1.73_m2} (ref >59)
GFR, EST NON AFRICAN AMERICAN: 59 mL/min/{1.73_m2} — AB (ref >59)
Glucose: 105 mg/dL — ABNORMAL HIGH (ref 65–99)
Potassium: 4.6 mmol/L (ref 3.5–5.2)
Sodium: 140 mmol/L (ref 134–144)

## 2017-08-12 ENCOUNTER — Encounter: Payer: Self-pay | Admitting: Family Medicine

## 2017-08-19 ENCOUNTER — Other Ambulatory Visit: Payer: Self-pay | Admitting: Family Medicine

## 2017-08-19 ENCOUNTER — Other Ambulatory Visit: Payer: Self-pay

## 2017-08-19 ENCOUNTER — Telehealth: Payer: Self-pay | Admitting: Urology

## 2017-08-19 DIAGNOSIS — N200 Calculus of kidney: Secondary | ICD-10-CM

## 2017-08-19 MED ORDER — TAMSULOSIN HCL 0.4 MG PO CAPS: 0.4000 mg | ORAL_CAPSULE | Freq: Every day | ORAL | 0 refills | Status: DC

## 2017-08-19 NOTE — Telephone Encounter (Signed)
Pt has appointment for 09/04/17, 30 day supply sent into pharmacy, pt informed.

## 2017-08-19 NOTE — Telephone Encounter (Signed)
Pt called office LMOM stating that his pharmacy told him that he needs Prior Auth for his refill on Flomax. Please Advise [phone] Thanks.

## 2017-08-21 ENCOUNTER — Ambulatory Visit: Payer: BLUE CROSS/BLUE SHIELD

## 2017-09-04 ENCOUNTER — Encounter: Payer: Self-pay | Admitting: Urology

## 2017-09-04 ENCOUNTER — Ambulatory Visit (INDEPENDENT_AMBULATORY_CARE_PROVIDER_SITE_OTHER): Payer: BLUE CROSS/BLUE SHIELD | Admitting: Urology

## 2017-09-04 VITALS — BP 145/73 | HR 99 | Resp 16 | Ht 70.0 in | Wt 183.6 lb

## 2017-09-04 DIAGNOSIS — N200 Calculus of kidney: Secondary | ICD-10-CM

## 2017-09-04 DIAGNOSIS — N529 Male erectile dysfunction, unspecified: Secondary | ICD-10-CM | POA: Diagnosis not present

## 2017-09-04 DIAGNOSIS — N400 Enlarged prostate without lower urinary tract symptoms: Secondary | ICD-10-CM | POA: Diagnosis not present
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
## 2017-09-23 ENCOUNTER — Other Ambulatory Visit: Payer: Self-pay

## 2017-09-23 DIAGNOSIS — N200 Calculus of kidney: Secondary | ICD-10-CM

## 2017-09-23 MED ORDER — TAMSULOSIN HCL 0.4 MG PO CAPS: 0.4000 mg | ORAL_CAPSULE | Freq: Every day | ORAL | 3 refills | Status: DC

## 2017-09-23 MED ORDER — POTASSIUM CITRATE ER 15 MEQ (1620 MG) PO TBCR: 1.0000 | EXTENDED_RELEASE_TABLET | Freq: Two times a day (BID) | ORAL | 11 refills | Status: DC

## 2017-10-03 ENCOUNTER — Other Ambulatory Visit: Payer: Self-pay | Admitting: Family Medicine

## 2017-10-03 DIAGNOSIS — E119 Type 2 diabetes mellitus without complications: Secondary | ICD-10-CM

## 2017-11-08 ENCOUNTER — Other Ambulatory Visit: Payer: Self-pay | Admitting: Family Medicine

## 2017-11-08 DIAGNOSIS — E119 Type 2 diabetes mellitus without complications: Secondary | ICD-10-CM

## 2017-11-23 ENCOUNTER — Telehealth: Payer: Self-pay | Admitting: Family Medicine

## 2017-11-23 NOTE — Telephone Encounter (Signed)
Patient has been adopted by Medicare as of last month and needs new prescriptions sent to a new pharmacy per his new plan. He is changing to [facility] on [address] (he used to use [facility], but I have taken it out and updated the new pharmacy). He needs new prescriptions for his Victoza, losartan, carvedilol, and his Invokamet. These need to be 3 mnth supplies too. Patient would like a phone call to confirm if this is taken care of please

## 2017-11-24 ENCOUNTER — Other Ambulatory Visit: Payer: Self-pay

## 2017-11-24 DIAGNOSIS — I10 Essential (primary) hypertension: Secondary | ICD-10-CM

## 2017-11-24 DIAGNOSIS — E1159 Type 2 diabetes mellitus with other circulatory complications: Secondary | ICD-10-CM

## 2017-11-24 DIAGNOSIS — E119 Type 2 diabetes mellitus without complications: Secondary | ICD-10-CM

## 2017-11-24 MED ORDER — LOSARTAN POTASSIUM 100 MG PO TABS: 100.0000 mg | ORAL_TABLET | Freq: Every day | ORAL | 1 refills | Status: DC

## 2017-11-24 MED ORDER — CANAGLIFLOZIN-METFORMIN HCL ER 150-1000 MG PO TB24: 1.0000 | ORAL_TABLET | Freq: Two times a day (BID) | ORAL | 1 refills | Status: DC

## 2017-11-24 MED ORDER — CARVEDILOL 6.25 MG PO TABS: 6.2500 mg | ORAL_TABLET | Freq: Two times a day (BID) | ORAL | 1 refills | Status: DC

## 2017-11-24 MED ORDER — LIRAGLUTIDE 18 MG/3ML ~~LOC~~ SOPN: PEN_INJECTOR | SUBCUTANEOUS | 2 refills | Status: DC

## 2017-11-24 NOTE — Telephone Encounter (Signed)
Refills sent into the new Pharmacy and patient notified. MPulliam, CMA/RT(R)

## 2017-11-24 NOTE — Telephone Encounter (Signed)
Refills on medications. MPulliam, CMA/RT(R)

## 2017-12-09 ENCOUNTER — Ambulatory Visit: Payer: BLUE CROSS/BLUE SHIELD | Admitting: Family Medicine

## 2017-12-17 ENCOUNTER — Ambulatory Visit (INDEPENDENT_AMBULATORY_CARE_PROVIDER_SITE_OTHER): Payer: Self-pay | Admitting: Family Medicine

## 2017-12-17 ENCOUNTER — Encounter: Payer: Self-pay | Admitting: Family Medicine

## 2017-12-17 VITALS — BP 127/89 | HR 97 | Ht 70.0 in | Wt 178.2 lb

## 2017-12-17 DIAGNOSIS — E1159 Type 2 diabetes mellitus with other circulatory complications: Secondary | ICD-10-CM

## 2017-12-17 DIAGNOSIS — E1169 Type 2 diabetes mellitus with other specified complication: Secondary | ICD-10-CM

## 2017-12-17 DIAGNOSIS — E782 Mixed hyperlipidemia: Secondary | ICD-10-CM

## 2017-12-17 DIAGNOSIS — I10 Essential (primary) hypertension: Secondary | ICD-10-CM

## 2017-12-17 DIAGNOSIS — E1165 Type 2 diabetes mellitus with hyperglycemia: Secondary | ICD-10-CM

## 2017-12-17 LAB — POCT UA - MICROALBUMIN
Creatinine, POC: 100 mg/dL
MICROALBUMIN (UR) POC: 10 mg/L

## 2017-12-17 LAB — POCT GLYCOSYLATED HEMOGLOBIN (HGB A1C): Hemoglobin A1C: 5.6 % (ref 4.0–5.6)

## 2017-12-17 MED ORDER — METFORMIN HCL ER 500 MG PO TB24: 1000.0000 mg | ORAL_TABLET | Freq: Two times a day (BID) | ORAL | 3 refills | Status: DC
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
## 2017-12-17 NOTE — Patient Instructions (Signed)
Please return in 3 months instead of 4 since we are changing medications.  Please keep a close eye on your fasting blood sugars as well as 2-hour postprandial if you eat especially poorly. Please follow-up sooner than planned if any questions or concerns come up.  Great job Mr. Patient!!!

## 2017-12-18 ENCOUNTER — Other Ambulatory Visit: Payer: Self-pay | Admitting: Family Medicine

## 2017-12-23 DIAGNOSIS — N5201 Erectile dysfunction due to arterial insufficiency: Secondary | ICD-10-CM | POA: Diagnosis not present

## 2017-12-23 DIAGNOSIS — Z87442 Personal history of urinary calculi: Secondary | ICD-10-CM | POA: Diagnosis not present

## 2017-12-29 ENCOUNTER — Other Ambulatory Visit: Payer: Self-pay | Admitting: Family Medicine

## 2018-02-02 ENCOUNTER — Emergency Department: Payer: Medicare Other

## 2018-02-02 ENCOUNTER — Other Ambulatory Visit: Payer: Self-pay

## 2018-02-02 ENCOUNTER — Encounter: Payer: Self-pay | Admitting: Emergency Medicine

## 2018-02-02 DIAGNOSIS — Z7984 Long term (current) use of oral hypoglycemic drugs: Secondary | ICD-10-CM | POA: Insufficient documentation

## 2018-02-02 DIAGNOSIS — R079 Chest pain, unspecified: Secondary | ICD-10-CM | POA: Insufficient documentation

## 2018-02-02 DIAGNOSIS — E119 Type 2 diabetes mellitus without complications: Secondary | ICD-10-CM | POA: Diagnosis not present

## 2018-02-02 DIAGNOSIS — Z79899 Other long term (current) drug therapy: Secondary | ICD-10-CM | POA: Diagnosis not present

## 2018-02-02 DIAGNOSIS — I10 Essential (primary) hypertension: Secondary | ICD-10-CM | POA: Diagnosis not present

## 2018-02-02 DIAGNOSIS — M62838 Other muscle spasm: Secondary | ICD-10-CM | POA: Insufficient documentation

## 2018-02-02 DIAGNOSIS — M542 Cervicalgia: Secondary | ICD-10-CM | POA: Diagnosis not present

## 2018-02-02 DIAGNOSIS — Z7982 Long term (current) use of aspirin: Secondary | ICD-10-CM | POA: Diagnosis not present

## 2018-02-02 DIAGNOSIS — R0789 Other chest pain: Secondary | ICD-10-CM | POA: Diagnosis not present

## 2018-02-02 LAB — CBC WITH DIFFERENTIAL/PLATELET
Abs Immature Granulocytes: 0.01 10*3/uL (ref 0.00–0.07)
Basophils Absolute: 0.1 10*3/uL (ref 0.0–0.1)
Basophils Relative: 1 %
EOS ABS: 0.3 10*3/uL (ref 0.0–0.5)
EOS PCT: 4 %
HCT: 43.6 % (ref 39.0–52.0)
HEMOGLOBIN: 15.5 g/dL (ref 13.0–17.0)
IMMATURE GRANULOCYTES: 0 %
LYMPHS PCT: 33 %
Lymphs Abs: 2 10*3/uL (ref 0.7–4.0)
MCH: 30.7 pg (ref 26.0–34.0)
MCHC: 35.6 g/dL (ref 30.0–36.0)
MCV: 86.3 fL (ref 80.0–100.0)
MONOS PCT: 10 %
Monocytes Absolute: 0.6 10*3/uL (ref 0.1–1.0)
Neutro Abs: 3.1 10*3/uL (ref 1.7–7.7)
Neutrophils Relative %: 52 %
Platelets: 205 10*3/uL (ref 150–400)
RBC: 5.05 MIL/uL (ref 4.22–5.81)
RDW: 12.2 % (ref 11.5–15.5)
WBC: 6.1 10*3/uL (ref 4.0–10.5)
nRBC: 0 % (ref 0.0–0.2)

## 2018-02-02 NOTE — ED Triage Notes (Signed)
Patient ambulatory to triage with steady gait, without difficulty or distress noted; pt reports elevated BP last wk; 179-200/90; st has had recent left sided neck & chest pain last wk

## 2018-02-03 ENCOUNTER — Ambulatory Visit (INDEPENDENT_AMBULATORY_CARE_PROVIDER_SITE_OTHER): Payer: Medicare Other | Admitting: Family Medicine

## 2018-02-03 ENCOUNTER — Encounter: Payer: Self-pay | Admitting: Family Medicine

## 2018-02-03 ENCOUNTER — Emergency Department
Admission: EM | Admit: 2018-02-03 | Discharge: 2018-02-03 | Disposition: A | Discharge status: Home / Self Care | Payer: Medicare Other | Attending: Emergency Medicine | Admitting: Emergency Medicine

## 2018-02-03 ENCOUNTER — Emergency Department: Payer: Medicare Other

## 2018-02-03 VITALS — BP 134/72 | HR 98 | Ht 70.0 in | Wt 183.7 lb

## 2018-02-03 DIAGNOSIS — I10 Essential (primary) hypertension: Secondary | ICD-10-CM | POA: Diagnosis not present

## 2018-02-03 DIAGNOSIS — M62830 Muscle spasm of back: Secondary | ICD-10-CM | POA: Diagnosis not present

## 2018-02-03 DIAGNOSIS — R0789 Other chest pain: Secondary | ICD-10-CM | POA: Diagnosis not present

## 2018-02-03 DIAGNOSIS — M25512 Pain in left shoulder: Secondary | ICD-10-CM | POA: Diagnosis not present

## 2018-02-03 DIAGNOSIS — M62838 Other muscle spasm: Secondary | ICD-10-CM

## 2018-02-03 LAB — COMPREHENSIVE METABOLIC PANEL
ALBUMIN: 4.1 g/dL (ref 3.5–5.0)
ALK PHOS: 45 U/L (ref 38–126)
ALT: 19 U/L (ref 0–44)
AST: 21 U/L (ref 15–41)
Anion gap: 5 (ref 5–15)
BILIRUBIN TOTAL: 0.7 mg/dL (ref 0.3–1.2)
BUN: 21 mg/dL (ref 8–23)
CALCIUM: 9.3 mg/dL (ref 8.9–10.3)
CO2: 26 mmol/L (ref 22–32)
Chloride: 108 mmol/L (ref 98–111)
Creatinine, Ser: 0.83 mg/dL (ref 0.61–1.24)
GFR calc Af Amer: >60 mL/min (ref >60)
GFR calc non Af Amer: >60 mL/min (ref >60)
GLUCOSE: 142 mg/dL — AB (ref 70–99)
Potassium: 3.8 mmol/L (ref 3.5–5.1)
Sodium: 139 mmol/L (ref 135–145)
TOTAL PROTEIN: 6.8 g/dL (ref 6.5–8.1)

## 2018-02-03 LAB — TROPONIN I
Troponin I: <0.03 ng/mL (ref <0.03)
Troponin I: <0.03 ng/mL (ref <0.03)

## 2018-02-03 MED ORDER — CYCLOBENZAPRINE HCL 10 MG PO TABS: 10.0000 mg | ORAL_TABLET | Freq: Three times a day (TID) | ORAL | 0 refills | Status: DC | PRN

## 2018-02-03 MED ORDER — HYDROCODONE-ACETAMINOPHEN 7.5-325 MG PO TABS: 1.0000 | ORAL_TABLET | Freq: Four times a day (QID) | ORAL | 0 refills | Status: DC | PRN

## 2018-02-03 MED ORDER — CYCLOBENZAPRINE HCL 10 MG PO TABS
10.0000 mg | ORAL_TABLET | Freq: Once | ORAL | Status: AC
  Administered 2018-02-03: 10 mg via ORAL
  Filled 2018-02-03: qty 1

## 2018-02-03 MED ORDER — LIDOCAINE 5 % EX PTCH: 1.0000 | MEDICATED_PATCH | CUTANEOUS | 0 refills | Status: DC

## 2018-02-03 MED ORDER — LIDOCAINE 5 % EX PTCH
1.0000 | MEDICATED_PATCH | CUTANEOUS | Status: DC
  Administered 2018-02-03: 1 via TRANSDERMAL
  Filled 2018-02-03: qty 1

## 2018-02-03 MED ORDER — IOHEXOL 350 MG/ML SOLN
75.0000 mL | Freq: Once | INTRAVENOUS | Status: AC | PRN
  Administered 2018-02-03: 75 mL via INTRAVENOUS

## 2018-02-03 NOTE — ED Notes (Signed)
Report from rebecca, rn.  

## 2018-02-03 NOTE — ED Notes (Signed)
BP has been high

## 2018-02-03 NOTE — Patient Instructions (Signed)
Again as we discussed please try 1/2 tablet of the Flexeril in the a.m. and in the afternoon -if it makes you too sleepy please only take it at night.        Muscle Cramps and Spasms Muscle cramps and spasms occur when a muscle or muscles tighten and you have no control over this tightening (involuntary muscle contraction). They are a common problem and can develop in any muscle. The most common place is in the calf muscles of the leg. Muscle cramps and muscle spasms are both involuntary muscle contractions, but there are some differences between the two:  Muscle cramps are painful. They come and go and may last a few seconds to 15 minutes. Muscle cramps are often more forceful and last longer than muscle spasms.  Muscle spasms may or may not be painful. They may also last just a few seconds or much longer.  Certain medical conditions, such as diabetes or Parkinson disease, can make it more likely to develop cramps or spasms. However, cramps or spasms are usually not caused by a serious underlying problem. Common causes include:  Overexertion.  Overuse from repetitive motions, or doing the same thing over and over.  Remaining in a certain position for a long period of time.  Improper preparation, form, or technique while playing a sport or doing an activity.  Dehydration.  Injury.  Side effects of some medicines.  Abnormally low levels of the salts and ions in your blood (electrolytes), especially potassium and calcium. This could happen if you are taking water pills (diuretics) or if you are pregnant.  In many cases, the cause of muscle cramps or spasms is unknown. Follow these instructions at home:  Stay well hydrated. Drink enough fluid to keep your urine clear or pale yellow.  Try massaging, stretching, and relaxing the affected muscle.  If directed, apply heat to tight or tense muscles as often as told by your health care provider. Use the heat source that your health care  provider recommends, such as a moist heat pack or a heating pad. ? Place a towel between your skin and the heat source. ? Leave the heat on for 20-30 minutes. ? Remove the heat if your skin turns bright red. This is especially important if you are unable to feel pain, heat, or cold. You may have a greater risk of getting burned.  If directed, put ice on the affected area. This may help if you are sore or have pain after a cramp or spasm. ? Put ice in a plastic bag. ? Place a towel between your skin and the bag. ? Leavethe ice on for 20 minutes, 2-3 times a day.  Take over-the-counter and prescription medicines only as told by your health care provider.  Pay attention to any changes in your symptoms. Contact a health care provider if:  Your cramps or spasms get more severe or happen more often.  Your cramps or spasms do not improve over time. This information is not intended to replace advice given to you by your health care provider. Make sure you discuss any questions you have with your health care provider. Document Released: 09/27/2001 Document Revised: 05/09/2015 Document Reviewed: 01/09/2015 Elsevier Interactive Patient Education  2018 [employer].
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
## 2018-02-03 NOTE — ED Notes (Signed)
Patient transported to CT 

## 2018-02-03 NOTE — ED Notes (Signed)
Pt updated on care plan. Declines offer for pain medication.
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
## 2018-02-04 ENCOUNTER — Telehealth: Payer: Self-pay | Admitting: Family Medicine

## 2018-02-04 NOTE — Telephone Encounter (Signed)
Patient called states he is still waiting on the : lidocaine (LIDODERM) 5 % [[id number]]   Order Details  Dose: 1 patch Route: Transdermal Frequency: Every 24 hours  Dispense Quantity: 30 patch Refills: 0 Fills remaining: --        Sig: Place 1 patch onto the skin daily.          --Per patient pharmacy waiting on (Authorization/ pre-cert).  --Forwarding message to medical assistant to contact pt when information on whether approved by Ins Co.  --glh

## 2018-02-04 NOTE — Telephone Encounter (Signed)
Medication approved patient and pharmacy notified. MPulliam, CMA/RT(R)

## 2018-02-05 ENCOUNTER — Ambulatory Visit (INDEPENDENT_AMBULATORY_CARE_PROVIDER_SITE_OTHER): Payer: Medicare Other

## 2018-02-05 ENCOUNTER — Encounter (INDEPENDENT_AMBULATORY_CARE_PROVIDER_SITE_OTHER): Payer: Self-pay | Admitting: Orthopedic Surgery

## 2018-02-05 ENCOUNTER — Ambulatory Visit (INDEPENDENT_AMBULATORY_CARE_PROVIDER_SITE_OTHER): Payer: Medicare Other | Admitting: Orthopedic Surgery

## 2018-02-05 ENCOUNTER — Encounter: Payer: Self-pay | Admitting: Family Medicine

## 2018-02-05 DIAGNOSIS — M25512 Pain in left shoulder: Secondary | ICD-10-CM | POA: Diagnosis not present

## 2018-02-05 DIAGNOSIS — M79602 Pain in left arm: Secondary | ICD-10-CM

## 2018-02-05 DIAGNOSIS — M542 Cervicalgia: Secondary | ICD-10-CM

## 2018-02-05 MED ORDER — METHYLPREDNISOLONE ACETATE 40 MG/ML IJ SUSP
30.0000 mg | INTRAMUSCULAR | Status: AC | PRN
  Administered 2018-02-05: 30 mg via INTRA_ARTICULAR

## 2018-02-05 MED ORDER — BUPIVACAINE HCL 0.5 % IJ SOLN
9.0000 mL | INTRAMUSCULAR | Status: AC | PRN
  Administered 2018-02-05: 9 mL via INTRA_ARTICULAR

## 2018-02-05 MED ORDER — LIDOCAINE HCL 1 % IJ SOLN
5.0000 mL | INTRAMUSCULAR | Status: AC | PRN
  Administered 2018-02-05: 5 mL
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
## 2018-02-17 IMAGING — CT CT CHEST W/O CM
1 series · 15 of 34 positions shown, 19 images · non-contrast
Comparison: CT abdomen and pelvis 07/15/2016

CLINICAL DATA: Followup RIGHT lower lobe pulmonary nodule, abnormal
CT abdomen

EXAM:
CT CHEST WITHOUT CONTRAST
TECHNIQUE: Multidetector CT imaging of the chest was performed following the
standard protocol without IV contrast. Sagittal and coronal MPR
images reconstructed from axial data set.

[Series 2: thorax · axial · 0.75mm/px · z∈[-648,-372]mm · 15 of 163 slices shown, 19 images]
[im 13/163  mediastinal]
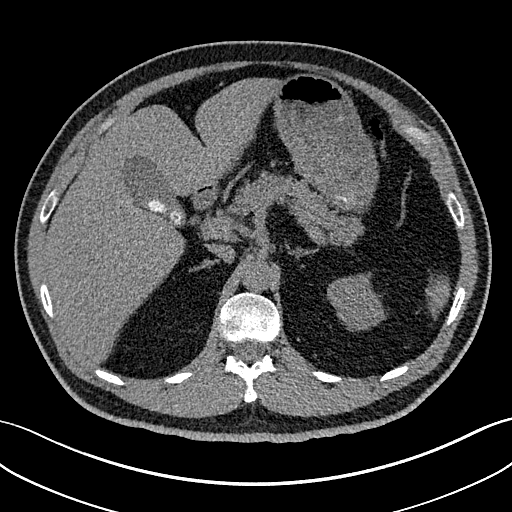
[im 13/163  lung]
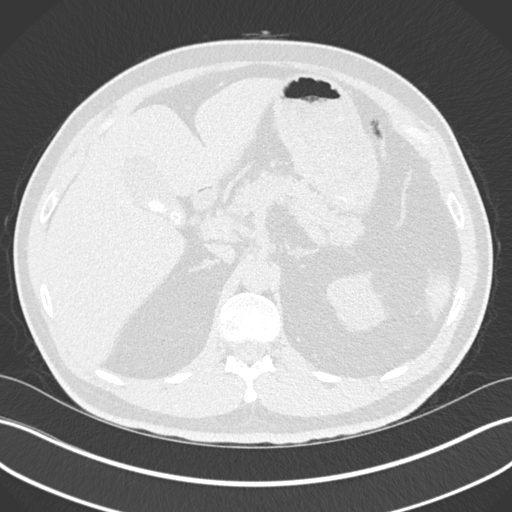
[im 25/163  lung]
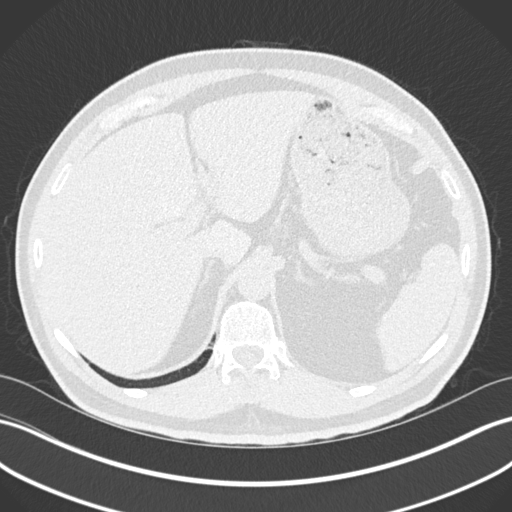
[im 33/163  lung]
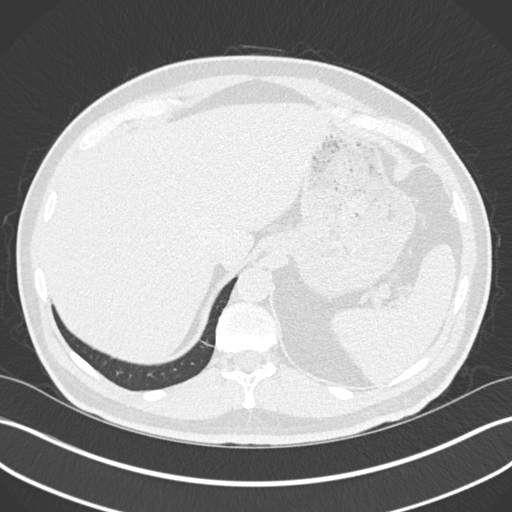
[im 43/163  lung]
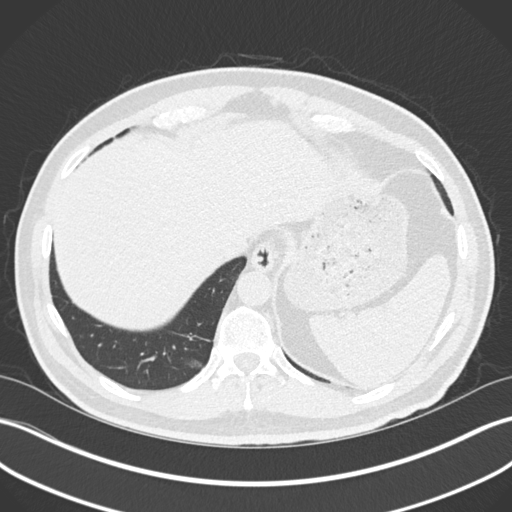
[im 55/163  mediastinal]
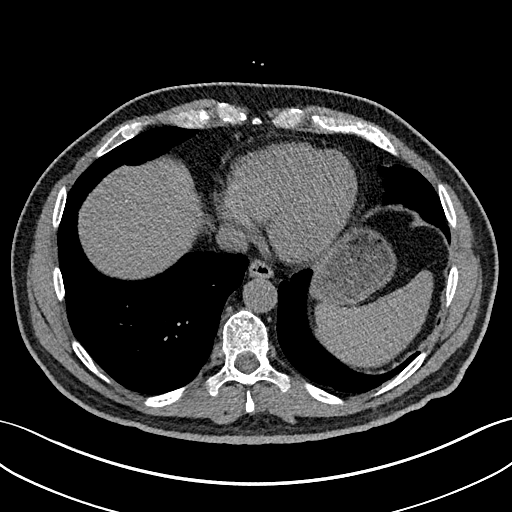
[im 55/163  lung]
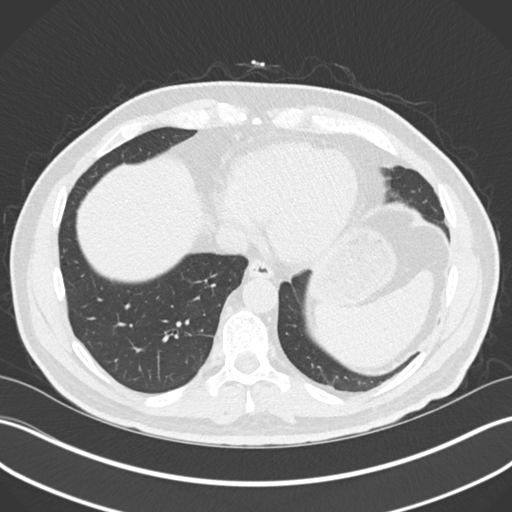
[im 65/163  lung]
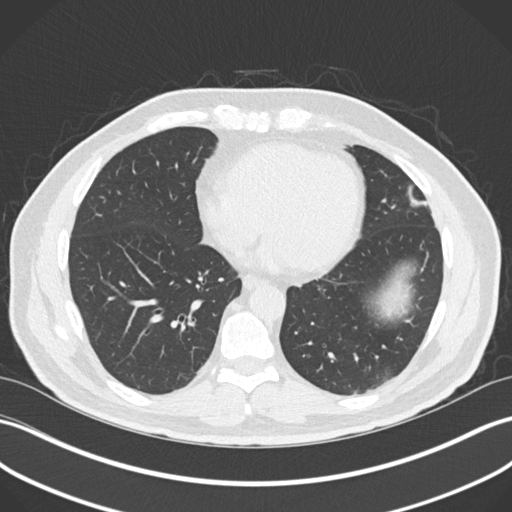
[im 73/163  lung]
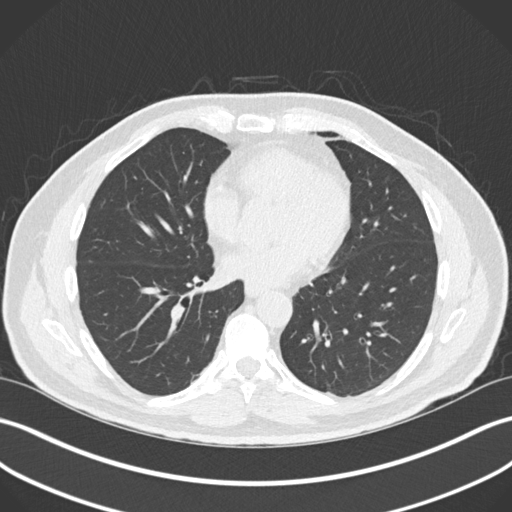
[im 85/163  lung]
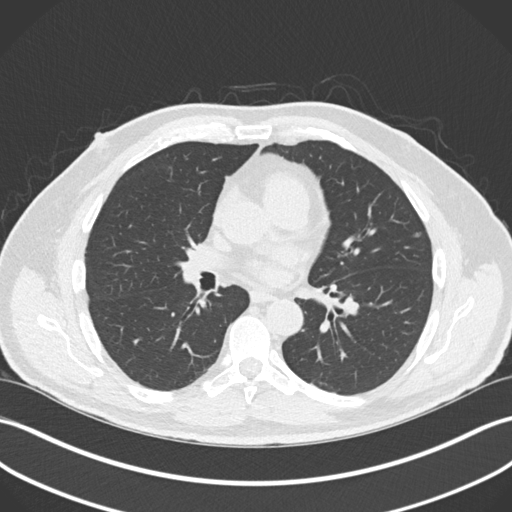
[im 91/163  mediastinal]
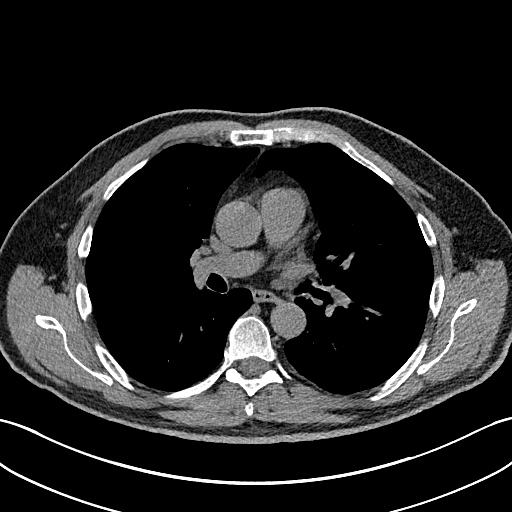
[im 91/163  lung]
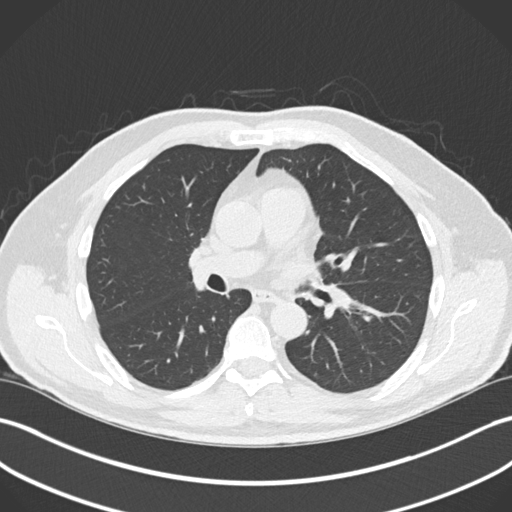
[im 98/163  lung]
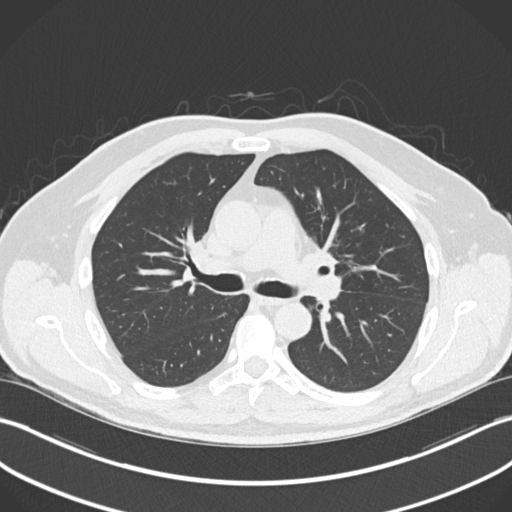
[im 109/163  lung]
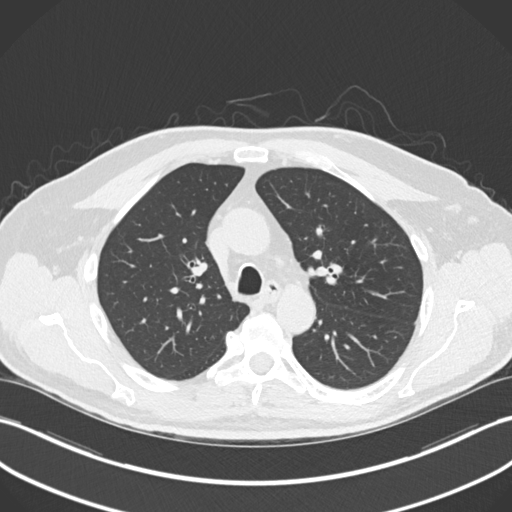
[im 121/163  lung]
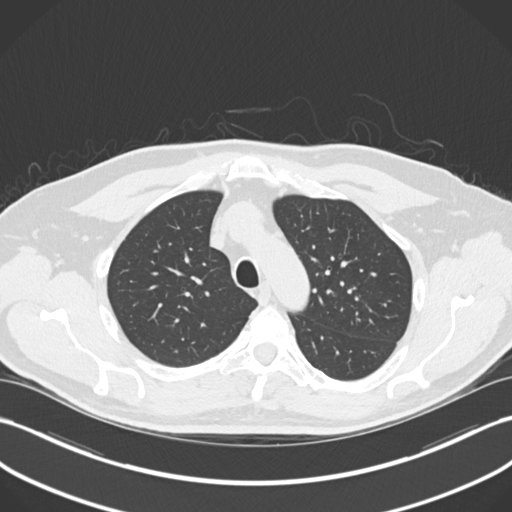
[im 130/163  mediastinal]
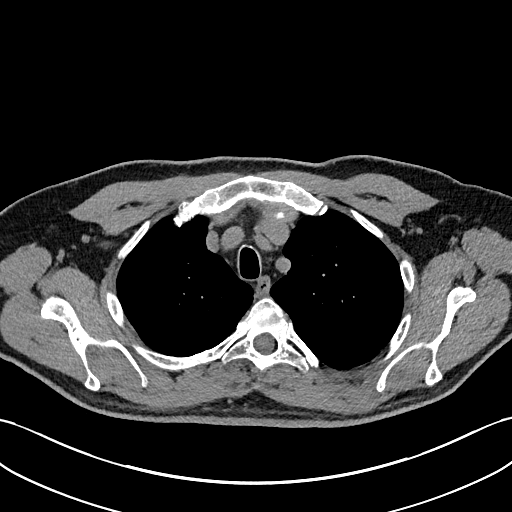
[im 130/163  lung]
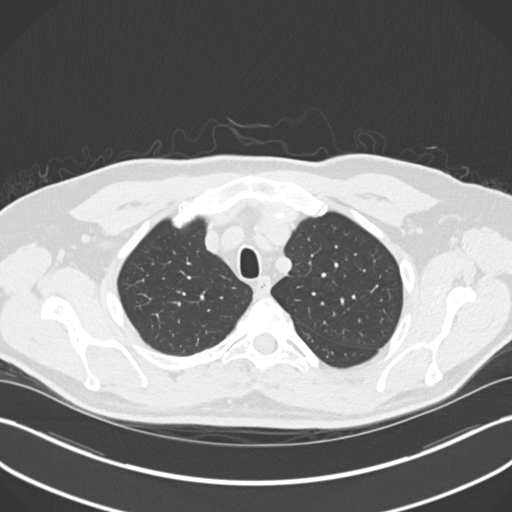
[im 139/163  lung]
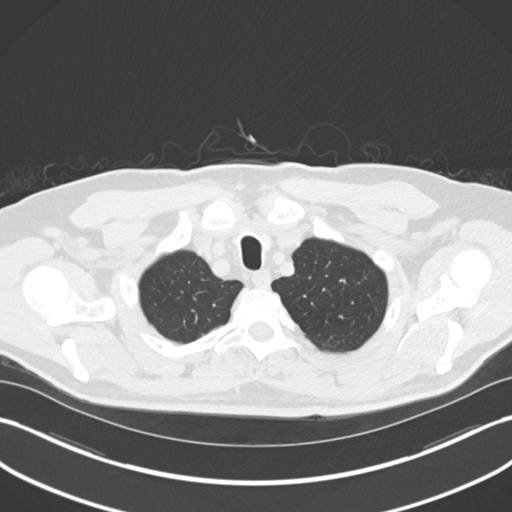
[im 151/163  lung]
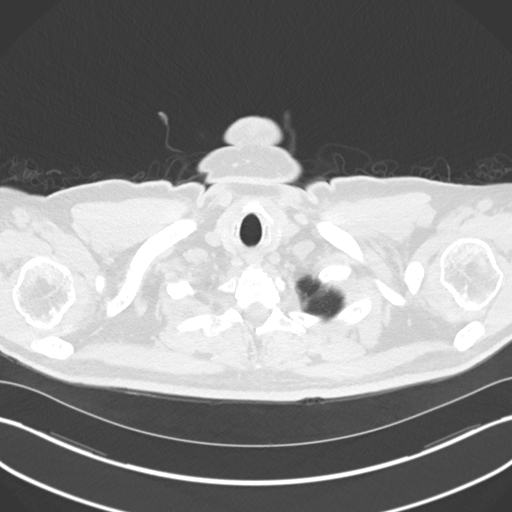

[15 of 34 positions shown; findings below may reference images not displayed]

FINDINGS: Cardiovascular: Minimal atherosclerotic calcification aorta and
coronary arteries. Aorta normal caliber. Heart unremarkable. No
pericardial effusion.

Mediastinum/Nodes: Few normal sized mediastinal lymph nodes. No
thoracic adenopathy. Base of cervical region normal appearance.
Esophagus unremarkable.

Lungs/Pleura: Macrolobulated noncalcified soft tissue nodule RIGHT
lower lobe 18 x 14 x 15 mm previously 17 x 13 x 15 mm. Minimal
atelectasis or scarring at base of lingula unchanged. Remaining
lungs clear. No pulmonary infiltrate, pleural effusion or
pneumothorax. No additional pulmonary mass/ nodule.

Upper Abdomen: Calcified gallstones dependently in gallbladder.
Remaining visualized upper abdomen unremarkable.

Musculoskeletal: Age-indeterminate superior endplate compression
deformity of T6 vertebral body with 30-40% anterior height loss. No
additional osseous findings.
IMPRESSION: Macrolobulated noncalcified soft tissue nodule RIGHT lower lobe 18 x
14 x 15 mm previously 17 x 13 x 15 mm coma of uncertain etiology but
malignancy is not excluded.

As this has persisted at 3 months, recommend assessment by PET CT to
exclude malignancy.

Cholelithiasis.

Age-indeterminate superior endplate compression fracture of T6
vertebral body.

Aortic Atherosclerosis ([id number]).

These results will be called to the ordering clinician or
representative by the Radiologist Assistant, and communication
documented in the PACS or zVision Dashboard.

## 2018-03-03 ENCOUNTER — Ambulatory Visit
Admission: RE | Admit: 2018-03-03 | Discharge: 2018-03-03 | Disposition: A | Discharge status: Home / Self Care | Payer: Medicare Other | Source: Ambulatory Visit | Attending: Orthopedic Surgery | Admitting: Orthopedic Surgery

## 2018-03-03 DIAGNOSIS — M542 Cervicalgia: Secondary | ICD-10-CM

## 2018-03-03 DIAGNOSIS — M25512 Pain in left shoulder: Secondary | ICD-10-CM | POA: Diagnosis not present

## 2018-03-03 MED ORDER — IOPAMIDOL (ISOVUE-M 200) INJECTION 41%
15.0000 mL | Freq: Once | INTRAMUSCULAR | Status: AC
  Administered 2018-03-03: 15 mL via INTRA_ARTICULAR

## 2018-03-05 ENCOUNTER — Encounter (INDEPENDENT_AMBULATORY_CARE_PROVIDER_SITE_OTHER): Payer: Self-pay | Admitting: Orthopedic Surgery

## 2018-03-05 ENCOUNTER — Ambulatory Visit (INDEPENDENT_AMBULATORY_CARE_PROVIDER_SITE_OTHER): Payer: Medicare Other | Admitting: Orthopedic Surgery

## 2018-03-05 DIAGNOSIS — M75122 Complete rotator cuff tear or rupture of left shoulder, not specified as traumatic: Secondary | ICD-10-CM | POA: Diagnosis not present

## 2018-03-05 DIAGNOSIS — M542 Cervicalgia: Secondary | ICD-10-CM

## 2018-03-05 MED ORDER — CYCLOBENZAPRINE HCL 5 MG PO TABS: ORAL_TABLET | ORAL | 0 refills | Status: DC

## 2018-03-05 MED ORDER — LIDOCAINE 1.8 % EX PTCH: 1.0000 | MEDICATED_PATCH | CUTANEOUS | 0 refills | Status: AC
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
## 2018-03-08 ENCOUNTER — Telehealth (INDEPENDENT_AMBULATORY_CARE_PROVIDER_SITE_OTHER): Payer: Self-pay | Admitting: *Deleted

## 2018-03-08 ENCOUNTER — Telehealth (INDEPENDENT_AMBULATORY_CARE_PROVIDER_SITE_OTHER): Payer: Self-pay | Admitting: Orthopedic Surgery

## 2018-03-08 DIAGNOSIS — M25512 Pain in left shoulder: Secondary | ICD-10-CM

## 2018-03-08 MED ORDER — LIDOCAINE 1.8 % EX PTCH: 1.0000 | MEDICATED_PATCH | Freq: Every day | CUTANEOUS | 0 refills | Status: DC

## 2018-03-08 MED ORDER — LIDOCAINE 5 % EX PTCH: 1.0000 | MEDICATED_PATCH | CUTANEOUS | 0 refills | Status: DC

## 2018-03-08 NOTE — Addendum Note (Signed)
Addended byLaurann Montana on: 03/08/2018 03:55 PM   Modules accepted: Orders

## 2018-03-08 NOTE — Telephone Encounter (Signed)
Per automated system at [phone] no PA is needed for any procedure.

## 2018-03-08 NOTE — Telephone Encounter (Signed)
Patient called needed to confirm with Dr.Dean that his Lidocaine is written out for 5% not 1.8%

## 2018-03-08 NOTE — Telephone Encounter (Signed)
Order corrected per patient request Needed patch for 5% not 1.8%. Sent in new rx for 5%-also called and LM for pharmacy.

## 2018-03-16 ENCOUNTER — Encounter: Payer: Self-pay | Admitting: Family Medicine

## 2018-03-16 ENCOUNTER — Ambulatory Visit (INDEPENDENT_AMBULATORY_CARE_PROVIDER_SITE_OTHER): Payer: Medicare Other | Admitting: Physical Medicine and Rehabilitation

## 2018-03-16 ENCOUNTER — Ambulatory Visit (INDEPENDENT_AMBULATORY_CARE_PROVIDER_SITE_OTHER): Payer: Medicare Other | Admitting: Family Medicine

## 2018-03-16 ENCOUNTER — Encounter (INDEPENDENT_AMBULATORY_CARE_PROVIDER_SITE_OTHER): Payer: Self-pay | Admitting: Physical Medicine and Rehabilitation

## 2018-03-16 ENCOUNTER — Ambulatory Visit (INDEPENDENT_AMBULATORY_CARE_PROVIDER_SITE_OTHER): Payer: Self-pay

## 2018-03-16 VITALS — BP 144/90 | HR 99 | Temp 98.4°F | Ht 70.0 in | Wt 185.0 lb

## 2018-03-16 VITALS — BP 161/94 | HR 85 | Temp 97.8°F

## 2018-03-16 DIAGNOSIS — M4802 Spinal stenosis, cervical region: Secondary | ICD-10-CM

## 2018-03-16 DIAGNOSIS — M5412 Radiculopathy, cervical region: Secondary | ICD-10-CM

## 2018-03-16 DIAGNOSIS — I10 Essential (primary) hypertension: Secondary | ICD-10-CM

## 2018-03-16 DIAGNOSIS — E1159 Type 2 diabetes mellitus with other circulatory complications: Secondary | ICD-10-CM | POA: Diagnosis not present

## 2018-03-16 DIAGNOSIS — I152 Hypertension secondary to endocrine disorders: Secondary | ICD-10-CM

## 2018-03-16 DIAGNOSIS — M25512 Pain in left shoulder: Secondary | ICD-10-CM

## 2018-03-16 DIAGNOSIS — E1165 Type 2 diabetes mellitus with hyperglycemia: Secondary | ICD-10-CM | POA: Diagnosis not present

## 2018-03-16 LAB — POCT GLYCOSYLATED HEMOGLOBIN (HGB A1C): Hemoglobin A1C: 6 % — AB (ref 4.0–5.6)

## 2018-03-16 MED ORDER — METHYLPREDNISOLONE ACETATE 80 MG/ML IJ SUSP
80.0000 mg | Freq: Once | INTRAMUSCULAR | Status: AC
  Administered 2018-03-16: 80 mg

## 2018-03-16 MED ORDER — CARVEDILOL 12.5 MG PO TABS: 12.5000 mg | ORAL_TABLET | Freq: Two times a day (BID) | ORAL | Status: DC
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
## 2018-03-16 NOTE — Progress Notes (Signed)
 .  Numeric Pain Rating Scale and Functional Assessment Average Pain 8   In the last MONTH (on 0-10 scale) has pain interfered with the following?  1. General activity like being  able to carry out your everyday physical activities such as walking, climbing stairs, carrying groceries, or moving a chair?  Rating(4)   +Driver, -BT, -Dye Allergies.  

## 2018-03-16 NOTE — Patient Instructions (Signed)
Please take note of your med list as we are doubling your carvedilol.  Again, this blood pressure hike is due to pain and as your pain decreases, so we will your blood pressure so please keep a close eye on things!    Please be checking your blood pressure regularly and if you have any questions or concerns before we meet next time, please not hesitate to ask.  And, again, I want you to continue to take the 4 tablets of metformin per day but just play with the doses of taking 3 tabs in the morning and 1 tab at night or vice versa to see if that helps the a.m. hunger pains.  Let me know if you have any questions or concerns.  Good luck with your upcoming procedures and please let me know if you need anything.

## 2018-03-16 NOTE — Patient Instructions (Signed)

## 2018-03-17 ENCOUNTER — Encounter (INDEPENDENT_AMBULATORY_CARE_PROVIDER_SITE_OTHER): Payer: Self-pay | Admitting: Orthopedic Surgery

## 2018-03-17 ENCOUNTER — Encounter (INDEPENDENT_AMBULATORY_CARE_PROVIDER_SITE_OTHER): Payer: Self-pay | Admitting: Physical Medicine and Rehabilitation

## 2018-03-17 NOTE — Telephone Encounter (Signed)
Got it thx

## 2018-03-21 ENCOUNTER — Other Ambulatory Visit: Payer: Self-pay | Admitting: Family Medicine

## 2018-03-21 ENCOUNTER — Encounter: Payer: Self-pay | Admitting: Family Medicine

## 2018-03-21 DIAGNOSIS — E119 Type 2 diabetes mellitus without complications: Secondary | ICD-10-CM

## 2018-03-22 ENCOUNTER — Other Ambulatory Visit: Payer: Self-pay | Admitting: Family Medicine

## 2018-03-22 ENCOUNTER — Telehealth: Payer: Self-pay | Admitting: Family Medicine

## 2018-03-22 DIAGNOSIS — E119 Type 2 diabetes mellitus without complications: Secondary | ICD-10-CM

## 2018-03-22 DIAGNOSIS — I10 Essential (primary) hypertension: Secondary | ICD-10-CM

## 2018-03-22 DIAGNOSIS — E1159 Type 2 diabetes mellitus with other circulatory complications: Secondary | ICD-10-CM

## 2018-03-22 MED ORDER — CARVEDILOL 12.5 MG PO TABS: 12.5000 mg | ORAL_TABLET | Freq: Two times a day (BID) | ORAL | 1 refills | Status: DC

## 2018-03-22 NOTE — Telephone Encounter (Signed)
Refill sent in for both medications.  PA information faxed to pharmacy (approved through 2022).  Patient notified. MPulliam, CMA/RT(R)

## 2018-03-22 NOTE — Telephone Encounter (Signed)
Patient called with a few medication issues that he needs resolved:  [facility] in [place] has not received his carvedilol prescription that was ordered last week  In regard to patient's Victoza, the pharm has not received this med nor a PA that is needed before insurance will cover, when reviewing this med with the patient he states that he does not use [facility], only [facility]. Can we please check where the PA is in processing and update patients, as well as [facility] not [facility] please.  Patient would like a call back either way to remain in the loop and can be reached on his mobile.

## 2018-03-26 ENCOUNTER — Telehealth (INDEPENDENT_AMBULATORY_CARE_PROVIDER_SITE_OTHER): Payer: Self-pay | Admitting: Orthopedic Surgery

## 2018-03-26 NOTE — Telephone Encounter (Signed)
Jonathan Cervantes from [facility] left a message in regards to a request that was faxed over to [place] yesterday (03/25/18).  She wanted to know if it was received or do they need to refax it.  CB#[phone].  Thank you.

## 2018-03-29 ENCOUNTER — Encounter: Payer: Self-pay | Admitting: Orthopedic Surgery

## 2018-03-29 DIAGNOSIS — M7522 Bicipital tendinitis, left shoulder: Secondary | ICD-10-CM | POA: Diagnosis not present

## 2018-03-29 DIAGNOSIS — G8918 Other acute postprocedural pain: Secondary | ICD-10-CM | POA: Diagnosis not present

## 2018-03-29 DIAGNOSIS — M75122 Complete rotator cuff tear or rupture of left shoulder, not specified as traumatic: Secondary | ICD-10-CM | POA: Diagnosis not present

## 2018-03-29 NOTE — Telephone Encounter (Signed)
Can you do me a favor and have them re-fax it, I didn't see it, maybe have them ATTN: it to me

## 2018-04-05 ENCOUNTER — Encounter (INDEPENDENT_AMBULATORY_CARE_PROVIDER_SITE_OTHER): Payer: Self-pay | Admitting: Orthopedic Surgery

## 2018-04-05 ENCOUNTER — Ambulatory Visit (INDEPENDENT_AMBULATORY_CARE_PROVIDER_SITE_OTHER): Payer: Medicare Other | Admitting: Orthopedic Surgery

## 2018-04-05 DIAGNOSIS — M75122 Complete rotator cuff tear or rupture of left shoulder, not specified as traumatic: Secondary | ICD-10-CM

## 2018-04-07 ENCOUNTER — Encounter (INDEPENDENT_AMBULATORY_CARE_PROVIDER_SITE_OTHER): Payer: Self-pay | Admitting: Orthopedic Surgery

## 2018-04-07 NOTE — Procedures (Signed)
Cervical Epidural Steroid Injection - Interlaminar Approach with Fluoroscopic Guidance  Patient: Jonathan Cervantes.      Date of Birth: 05-Nov-1950 MRN: [id number] PCP: Mellody Dance, DO      Visit Date: 03/16/2018   Universal Protocol:    Date/Time: 12/18/196:31 AM  Consent Given By: the patient  Position: PRONE  Additional Comments: Vital signs were monitored before and after the procedure. Patient was prepped and draped in the usual sterile fashion. The correct patient, procedure, and site was verified.   Injection Procedure Details:  Procedure Site One Meds Administered:  Meds ordered this encounter  Medications  . methylPREDNISolone acetate (DEPO-MEDROL) injection 80 mg     Laterality: Left  Location/Site: C7-T1  Needle size: 20 G  Needle type: Touhy  Needle Placement: Paramedian epidural space  Findings:  -Comments: Excellent flow of contrast into the epidural space.  Procedure Details: Using a paramedian approach from the side mentioned above, the region overlying the inferior lamina was localized under fluoroscopic visualization and the soft tissues overlying this structure were infiltrated with 4 ml. of 1% Lidocaine without Epinephrine. A # 20 gauge, Tuohy needle was inserted into the epidural space using a paramedian approach.  The epidural space was localized using loss of resistance along with lateral and contralateral oblique bi-planar fluoroscopic views.  After negative aspirate for air, blood, and CSF, a 2 ml. volume of Isovue-250 was injected into the epidural space and the flow of contrast was observed. Radiographs were obtained for documentation purposes.   The injectate was administered into the level noted above.  Additional Comments:  The patient tolerated the procedure well Dressing: Band-Aid    Post-procedure details: Patient was observed during the procedure. Post-procedure instructions were reviewed.  Patient left the clinic in stable  condition.
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
## 2018-04-29 ENCOUNTER — Ambulatory Visit (INDEPENDENT_AMBULATORY_CARE_PROVIDER_SITE_OTHER): Payer: Medicare Other | Admitting: Orthopedic Surgery

## 2018-04-30 ENCOUNTER — Ambulatory Visit (INDEPENDENT_AMBULATORY_CARE_PROVIDER_SITE_OTHER): Payer: Medicare Other | Admitting: Orthopedic Surgery

## 2018-04-30 DIAGNOSIS — M75122 Complete rotator cuff tear or rupture of left shoulder, not specified as traumatic: Secondary | ICD-10-CM

## 2018-05-01 ENCOUNTER — Encounter (INDEPENDENT_AMBULATORY_CARE_PROVIDER_SITE_OTHER): Payer: Self-pay | Admitting: Orthopedic Surgery
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
## 2018-05-03 ENCOUNTER — Inpatient Hospital Stay (INDEPENDENT_AMBULATORY_CARE_PROVIDER_SITE_OTHER): Payer: Medicare Other | Admitting: Orthopedic Surgery

## 2018-05-14 DIAGNOSIS — M25512 Pain in left shoulder: Secondary | ICD-10-CM | POA: Diagnosis not present

## 2018-05-17 ENCOUNTER — Telehealth (INDEPENDENT_AMBULATORY_CARE_PROVIDER_SITE_OTHER): Payer: Self-pay | Admitting: Orthopedic Surgery

## 2018-05-17 NOTE — Telephone Encounter (Signed)
Op note faxed to [facility] [phone]

## 2018-05-18 DIAGNOSIS — M25512 Pain in left shoulder: Secondary | ICD-10-CM | POA: Diagnosis not present

## 2018-05-21 DIAGNOSIS — M25512 Pain in left shoulder: Secondary | ICD-10-CM | POA: Diagnosis not present

## 2018-05-25 DIAGNOSIS — M25512 Pain in left shoulder: Secondary | ICD-10-CM | POA: Diagnosis not present

## 2018-05-27 DIAGNOSIS — M25512 Pain in left shoulder: Secondary | ICD-10-CM | POA: Diagnosis not present

## 2018-05-28 ENCOUNTER — Ambulatory Visit (INDEPENDENT_AMBULATORY_CARE_PROVIDER_SITE_OTHER): Payer: Medicare Other | Admitting: Orthopedic Surgery

## 2018-05-28 ENCOUNTER — Encounter (INDEPENDENT_AMBULATORY_CARE_PROVIDER_SITE_OTHER): Payer: Self-pay | Admitting: Orthopedic Surgery

## 2018-05-28 DIAGNOSIS — M75122 Complete rotator cuff tear or rupture of left shoulder, not specified as traumatic: Secondary | ICD-10-CM
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
## 2018-06-01 DIAGNOSIS — M25512 Pain in left shoulder: Secondary | ICD-10-CM | POA: Diagnosis not present

## 2018-06-03 DIAGNOSIS — M25512 Pain in left shoulder: Secondary | ICD-10-CM | POA: Diagnosis not present

## 2018-06-22 DIAGNOSIS — M25512 Pain in left shoulder: Secondary | ICD-10-CM | POA: Diagnosis not present

## 2018-06-24 DIAGNOSIS — M25512 Pain in left shoulder: Secondary | ICD-10-CM | POA: Diagnosis not present

## 2018-06-29 DIAGNOSIS — M25512 Pain in left shoulder: Secondary | ICD-10-CM | POA: Diagnosis not present

## 2018-07-01 DIAGNOSIS — M25512 Pain in left shoulder: Secondary | ICD-10-CM | POA: Diagnosis not present

## 2018-07-05 ENCOUNTER — Encounter (INDEPENDENT_AMBULATORY_CARE_PROVIDER_SITE_OTHER): Payer: Self-pay | Admitting: Orthopedic Surgery

## 2018-07-05 ENCOUNTER — Ambulatory Visit (INDEPENDENT_AMBULATORY_CARE_PROVIDER_SITE_OTHER): Payer: Medicare Other | Admitting: Orthopedic Surgery

## 2018-07-05 ENCOUNTER — Other Ambulatory Visit: Payer: Self-pay

## 2018-07-05 DIAGNOSIS — M25512 Pain in left shoulder: Secondary | ICD-10-CM | POA: Diagnosis not present

## 2018-07-05 DIAGNOSIS — M75122 Complete rotator cuff tear or rupture of left shoulder, not specified as traumatic: Secondary | ICD-10-CM

## 2018-07-06 ENCOUNTER — Other Ambulatory Visit (INDEPENDENT_AMBULATORY_CARE_PROVIDER_SITE_OTHER): Payer: Medicare Other

## 2018-07-06 DIAGNOSIS — E1165 Type 2 diabetes mellitus with hyperglycemia: Secondary | ICD-10-CM

## 2018-07-06 DIAGNOSIS — Z79899 Other long term (current) drug therapy: Secondary | ICD-10-CM | POA: Diagnosis not present

## 2018-07-06 DIAGNOSIS — E1159 Type 2 diabetes mellitus with other circulatory complications: Secondary | ICD-10-CM

## 2018-07-06 DIAGNOSIS — I10 Essential (primary) hypertension: Secondary | ICD-10-CM | POA: Diagnosis not present

## 2018-07-07 ENCOUNTER — Encounter (INDEPENDENT_AMBULATORY_CARE_PROVIDER_SITE_OTHER): Payer: Self-pay | Admitting: Orthopedic Surgery

## 2018-07-07 LAB — BASIC METABOLIC PANEL
BUN/Creatinine Ratio: 14 (ref 10–24)
BUN: 15 mg/dL (ref 8–27)
CALCIUM: 9.4 mg/dL (ref 8.6–10.2)
CHLORIDE: 101 mmol/L (ref 96–106)
CO2: 25 mmol/L (ref 20–29)
Creatinine, Ser: 1.07 mg/dL (ref 0.76–1.27)
GFR calc Af Amer: 84 mL/min/{1.73_m2} (ref >59)
GFR calc non Af Amer: 72 mL/min/{1.73_m2} (ref >59)
GLUCOSE: 275 mg/dL — AB (ref 65–99)
Potassium: 4.6 mmol/L (ref 3.5–5.2)
Sodium: 139 mmol/L (ref 134–144)

## 2018-07-07 LAB — HEMOGLOBIN A1C
ESTIMATED AVERAGE GLUCOSE: 169 mg/dL
Hgb A1c MFr Bld: 7.5 % — ABNORMAL HIGH (ref 4.8–5.6)
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
## 2018-07-08 DIAGNOSIS — M25512 Pain in left shoulder: Secondary | ICD-10-CM | POA: Diagnosis not present

## 2018-07-09 ENCOUNTER — Ambulatory Visit: Payer: Medicare Other | Admitting: Family Medicine

## 2018-07-13 DIAGNOSIS — M25512 Pain in left shoulder: Secondary | ICD-10-CM | POA: Diagnosis not present

## 2018-07-15 ENCOUNTER — Other Ambulatory Visit: Payer: Self-pay | Admitting: Family Medicine

## 2018-07-15 DIAGNOSIS — I10 Essential (primary) hypertension: Principal | ICD-10-CM

## 2018-07-15 DIAGNOSIS — M25512 Pain in left shoulder: Secondary | ICD-10-CM | POA: Diagnosis not present

## 2018-07-15 DIAGNOSIS — E1159 Type 2 diabetes mellitus with other circulatory complications: Secondary | ICD-10-CM

## 2018-07-16 ENCOUNTER — Telehealth: Payer: Medicare Other | Admitting: Family Medicine

## 2018-07-19 ENCOUNTER — Ambulatory Visit (INDEPENDENT_AMBULATORY_CARE_PROVIDER_SITE_OTHER): Payer: Medicare Other | Admitting: Family Medicine

## 2018-07-19 ENCOUNTER — Other Ambulatory Visit: Payer: Self-pay

## 2018-07-19 VITALS — BP 139/93 | HR 95 | Ht 70.0 in | Wt 184.0 lb

## 2018-07-19 DIAGNOSIS — M62830 Muscle spasm of back: Secondary | ICD-10-CM | POA: Diagnosis not present

## 2018-07-19 DIAGNOSIS — M25512 Pain in left shoulder: Secondary | ICD-10-CM

## 2018-07-19 DIAGNOSIS — E1159 Type 2 diabetes mellitus with other circulatory complications: Secondary | ICD-10-CM | POA: Diagnosis not present

## 2018-07-19 DIAGNOSIS — I10 Essential (primary) hypertension: Secondary | ICD-10-CM | POA: Diagnosis not present

## 2018-07-19 DIAGNOSIS — E559 Vitamin D deficiency, unspecified: Secondary | ICD-10-CM | POA: Diagnosis not present

## 2018-07-19 DIAGNOSIS — Z79899 Other long term (current) drug therapy: Secondary | ICD-10-CM

## 2018-07-19 DIAGNOSIS — E782 Mixed hyperlipidemia: Secondary | ICD-10-CM

## 2018-07-19 DIAGNOSIS — E1165 Type 2 diabetes mellitus with hyperglycemia: Secondary | ICD-10-CM

## 2018-07-19 DIAGNOSIS — E1169 Type 2 diabetes mellitus with other specified complication: Secondary | ICD-10-CM

## 2018-07-19 DIAGNOSIS — I152 Hypertension secondary to endocrine disorders: Secondary | ICD-10-CM
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
## 2018-09-10 ENCOUNTER — Other Ambulatory Visit: Payer: Self-pay | Admitting: Family Medicine

## 2018-09-10 DIAGNOSIS — E1165 Type 2 diabetes mellitus with hyperglycemia: Secondary | ICD-10-CM

## 2018-09-14 ENCOUNTER — Telehealth: Payer: Self-pay | Admitting: Family Medicine

## 2018-09-17 ENCOUNTER — Other Ambulatory Visit: Payer: Self-pay | Admitting: Family Medicine

## 2018-09-17 DIAGNOSIS — E1159 Type 2 diabetes mellitus with other circulatory complications: Secondary | ICD-10-CM

## 2018-09-17 DIAGNOSIS — I152 Hypertension secondary to endocrine disorders: Secondary | ICD-10-CM

## 2018-10-15 ENCOUNTER — Other Ambulatory Visit: Payer: Self-pay | Admitting: Family Medicine

## 2018-10-15 DIAGNOSIS — E1159 Type 2 diabetes mellitus with other circulatory complications: Secondary | ICD-10-CM

## 2018-10-15 DIAGNOSIS — I152 Hypertension secondary to endocrine disorders: Secondary | ICD-10-CM

## 2018-10-19 ENCOUNTER — Other Ambulatory Visit: Payer: Self-pay

## 2018-10-19 ENCOUNTER — Telehealth: Payer: Self-pay | Admitting: Family Medicine

## 2018-10-19 DIAGNOSIS — E119 Type 2 diabetes mellitus without complications: Secondary | ICD-10-CM

## 2018-10-19 MED ORDER — VICTOZA 18 MG/3ML ~~LOC~~ SOPN: 1.2000 mg | PEN_INJECTOR | Freq: Every day | SUBCUTANEOUS | 0 refills | Status: DC

## 2018-10-19 NOTE — Telephone Encounter (Signed)
Patient is requesting a refill of his Victoza, if approved please send to [facility] in [place] on [address].

## 2018-10-19 NOTE — Telephone Encounter (Signed)
Refill sent in for patient. MPulliam, CMA/RT(R)

## 2018-10-26 ENCOUNTER — Other Ambulatory Visit: Payer: Self-pay | Admitting: Family Medicine

## 2018-10-26 DIAGNOSIS — E1159 Type 2 diabetes mellitus with other circulatory complications: Secondary | ICD-10-CM

## 2018-10-27 ENCOUNTER — Other Ambulatory Visit: Payer: Self-pay | Admitting: Family Medicine

## 2018-10-27 DIAGNOSIS — E1159 Type 2 diabetes mellitus with other circulatory complications: Secondary | ICD-10-CM

## 2018-10-27 NOTE — Telephone Encounter (Signed)
Patient is on losartan 100 mg - medication is on back order please of approved change. MPulliam, CMA/RT(R)

## 2018-10-27 NOTE — Telephone Encounter (Signed)
Please send in telmisartan 80 mg tablet 1 p.o. daily Dispense 90 no refills.  Please make sure patient is aware that this is not a change I am making but rather his insurance is requiring [place] to make.  Thus, please have patient follow-up with me in 6 weeks or so after he starts his new medicine- he should be due for regular office visit anyhow in the near future would imagine

## 2018-10-27 NOTE — Telephone Encounter (Signed)
Please advise. MPulliam, CMA/RT(R)  

## 2018-10-28 MED ORDER — TELMISARTAN 80 MG PO TABS: 80.0000 mg | ORAL_TABLET | Freq: Every day | ORAL | 0 refills | Status: DC

## 2018-10-28 NOTE — Telephone Encounter (Signed)
Called and left message for the patient to call back. MPulliam, CMA/RT(R)

## 2018-10-28 NOTE — Telephone Encounter (Signed)
Patient called and notified. MPulliam, CMA/RT(R)

## 2018-10-28 NOTE — Addendum Note (Signed)
Addended by: Lanier Prude D on: 10/28/2018 10:44 AM   Modules accepted: Orders

## 2019-01-14 ENCOUNTER — Telehealth: Payer: Self-pay | Admitting: Family Medicine

## 2019-01-14 NOTE — Telephone Encounter (Signed)
Called pt to set up :   Patient is due for follow up with labs before, please call the patient to make an appointment. Thanks. MPulliam, CMA/RT(R)   ---Left msg for pt to contact us to set of Appt .  -FYI to med asst. -glh

## 2019-01-14 NOTE — Telephone Encounter (Signed)
-----   Message from Jerilee Field, [place] sent at 10/18/2018 11:42 AM EDT ----- Patient is due for follow up with labs before, please call the patient to make an appointment.  Thanks. MPulliam, CMA/RT(R)

## 2019-01-25 ENCOUNTER — Other Ambulatory Visit: Payer: Self-pay

## 2019-01-25 ENCOUNTER — Other Ambulatory Visit: Payer: PRIVATE HEALTH INSURANCE

## 2019-01-25 DIAGNOSIS — E1159 Type 2 diabetes mellitus with other circulatory complications: Secondary | ICD-10-CM

## 2019-01-25 DIAGNOSIS — Z79899 Other long term (current) drug therapy: Secondary | ICD-10-CM

## 2019-01-25 DIAGNOSIS — E1165 Type 2 diabetes mellitus with hyperglycemia: Secondary | ICD-10-CM

## 2019-01-25 DIAGNOSIS — M62830 Muscle spasm of back: Secondary | ICD-10-CM

## 2019-01-25 DIAGNOSIS — E1169 Type 2 diabetes mellitus with other specified complication: Secondary | ICD-10-CM

## 2019-01-25 NOTE — Addendum Note (Signed)
Addended by: Fonnie Mu on: 01/25/2019 10:03 AM   Modules accepted: Orders

## 2019-01-28 ENCOUNTER — Ambulatory Visit: Payer: Medicare Other | Admitting: Family Medicine

## 2019-01-29 ENCOUNTER — Other Ambulatory Visit: Payer: Self-pay | Admitting: Family Medicine

## 2019-04-09 ENCOUNTER — Other Ambulatory Visit: Payer: Self-pay | Admitting: Family Medicine

## 2019-04-09 DIAGNOSIS — I152 Hypertension secondary to endocrine disorders: Secondary | ICD-10-CM

## 2019-04-09 DIAGNOSIS — E1159 Type 2 diabetes mellitus with other circulatory complications: Secondary | ICD-10-CM

## 2019-04-09 DIAGNOSIS — E1165 Type 2 diabetes mellitus with hyperglycemia: Secondary | ICD-10-CM

## 2019-05-06 ENCOUNTER — Other Ambulatory Visit: Payer: Self-pay | Admitting: Family Medicine

## 2019-05-06 DIAGNOSIS — E1159 Type 2 diabetes mellitus with other circulatory complications: Secondary | ICD-10-CM

## 2019-05-06 DIAGNOSIS — E1165 Type 2 diabetes mellitus with hyperglycemia: Secondary | ICD-10-CM

## 2019-05-18 ENCOUNTER — Other Ambulatory Visit: Payer: Self-pay | Admitting: Family Medicine

## 2019-05-18 DIAGNOSIS — E1159 Type 2 diabetes mellitus with other circulatory complications: Secondary | ICD-10-CM

## 2019-05-18 DIAGNOSIS — E1165 Type 2 diabetes mellitus with hyperglycemia: Secondary | ICD-10-CM

## 2019-06-24 ENCOUNTER — Telehealth: Payer: Self-pay | Admitting: Family Medicine

## 2019-06-24 NOTE — Telephone Encounter (Signed)
Spoke with Dr. Raliegh Scarlet and she said unfortunately at this time we would not be able to send in any more medications for patient since he has not been seen since 06/2018 and was then told to follow up in 09/2018. Patient states that his issues with Medicare started 1/21. Dr. Raliegh Scarlet is concerned because his labs are elevated, that his BP is poorly controlled. Per Dr. Raliegh Scarlet patient can be seen at ED or a clinic that is low cost for medications if he is unable to be seen in our office.   Patient advised of the above and verbalized understanding. AS, CMA

## 2019-06-24 NOTE — Telephone Encounter (Signed)
Patient is aware that he is overdue for a f/u appt and needs to be seen prior to additional refills. But Medicare messed up and lapsed his coverage through no fault of his own. He has been working with them but every time he calls for an update he's told it'll be a few more weeks before his insurance is reinstated. He does not have the money to come in as a self pay patient and is aware of the office protocol (he has received his 30 day and 15 day supply). He is hoping that Dr. Jenetta Downer will possibly make an exception and send him in a refill of any amount until this issue is resolved. He is about out of his carvedilol and metformin.

## 2021-01-30 ENCOUNTER — Emergency Department (HOSPITAL_BASED_OUTPATIENT_CLINIC_OR_DEPARTMENT_OTHER): Payer: Self-pay

## 2021-01-30 ENCOUNTER — Emergency Department (HOSPITAL_BASED_OUTPATIENT_CLINIC_OR_DEPARTMENT_OTHER): Payer: Self-pay | Admitting: Radiology

## 2021-01-30 ENCOUNTER — Emergency Department (HOSPITAL_BASED_OUTPATIENT_CLINIC_OR_DEPARTMENT_OTHER)
Admission: EM | Admit: 2021-01-30 | Discharge: 2021-01-30 | Disposition: A | Discharge status: Home / Self Care | Payer: Self-pay | Attending: Emergency Medicine | Admitting: Emergency Medicine

## 2021-01-30 ENCOUNTER — Other Ambulatory Visit: Payer: Self-pay

## 2021-01-30 ENCOUNTER — Encounter (HOSPITAL_BASED_OUTPATIENT_CLINIC_OR_DEPARTMENT_OTHER): Payer: Self-pay | Admitting: Emergency Medicine

## 2021-01-30 DIAGNOSIS — R55 Syncope and collapse: Secondary | ICD-10-CM | POA: Insufficient documentation

## 2021-01-30 DIAGNOSIS — S0990XA Unspecified injury of head, initial encounter: Secondary | ICD-10-CM | POA: Insufficient documentation

## 2021-01-30 DIAGNOSIS — W0110XA Fall on same level from slipping, tripping and stumbling with subsequent striking against unspecified object, initial encounter: Secondary | ICD-10-CM | POA: Insufficient documentation

## 2021-01-30 DIAGNOSIS — Z7984 Long term (current) use of oral hypoglycemic drugs: Secondary | ICD-10-CM | POA: Insufficient documentation

## 2021-01-30 DIAGNOSIS — I10 Essential (primary) hypertension: Secondary | ICD-10-CM | POA: Insufficient documentation

## 2021-01-30 DIAGNOSIS — Z23 Encounter for immunization: Secondary | ICD-10-CM | POA: Insufficient documentation

## 2021-01-30 DIAGNOSIS — I4510 Unspecified right bundle-branch block: Secondary | ICD-10-CM | POA: Insufficient documentation

## 2021-01-30 DIAGNOSIS — R000 Tachycardia, unspecified: Secondary | ICD-10-CM | POA: Insufficient documentation

## 2021-01-30 DIAGNOSIS — S4991XA Unspecified injury of right shoulder and upper arm, initial encounter: Secondary | ICD-10-CM | POA: Insufficient documentation

## 2021-01-30 DIAGNOSIS — E119 Type 2 diabetes mellitus without complications: Secondary | ICD-10-CM | POA: Insufficient documentation

## 2021-01-30 DIAGNOSIS — J45909 Unspecified asthma, uncomplicated: Secondary | ICD-10-CM | POA: Insufficient documentation

## 2021-01-30 DIAGNOSIS — R911 Solitary pulmonary nodule: Secondary | ICD-10-CM | POA: Insufficient documentation

## 2021-01-30 DIAGNOSIS — K8020 Calculus of gallbladder without cholecystitis without obstruction: Secondary | ICD-10-CM | POA: Insufficient documentation

## 2021-01-30 DIAGNOSIS — T148XXA Other injury of unspecified body region, initial encounter: Secondary | ICD-10-CM

## 2021-01-30 DIAGNOSIS — Z7982 Long term (current) use of aspirin: Secondary | ICD-10-CM | POA: Insufficient documentation

## 2021-01-30 DIAGNOSIS — Z79899 Other long term (current) drug therapy: Secondary | ICD-10-CM | POA: Insufficient documentation

## 2021-01-30 DIAGNOSIS — Y92481 Parking lot as the place of occurrence of the external cause: Secondary | ICD-10-CM | POA: Insufficient documentation

## 2021-01-30 DIAGNOSIS — I2510 Atherosclerotic heart disease of native coronary artery without angina pectoris: Secondary | ICD-10-CM | POA: Insufficient documentation

## 2021-01-30 DIAGNOSIS — W19XXXA Unspecified fall, initial encounter: Secondary | ICD-10-CM

## 2021-01-30 DIAGNOSIS — M25519 Pain in unspecified shoulder: Secondary | ICD-10-CM

## 2021-01-30 DIAGNOSIS — S299XXA Unspecified injury of thorax, initial encounter: Secondary | ICD-10-CM | POA: Insufficient documentation

## 2021-01-30 LAB — CBC WITH DIFFERENTIAL/PLATELET
Abs Immature Granulocytes: 0.01 10*3/uL (ref 0.00–0.07)
Basophils Absolute: 0 10*3/uL (ref 0.0–0.1)
Basophils Relative: 1 %
Eosinophils Absolute: 0.1 10*3/uL (ref 0.0–0.5)
Eosinophils Relative: 3 %
HCT: 48.2 % (ref 39.0–52.0)
Hemoglobin: 17 g/dL (ref 13.0–17.0)
Immature Granulocytes: 0 %
Lymphocytes Relative: 24 %
Lymphs Abs: 1.1 10*3/uL (ref 0.7–4.0)
MCH: 30.4 pg (ref 26.0–34.0)
MCHC: 35.3 g/dL (ref 30.0–36.0)
MCV: 86.2 fL (ref 80.0–100.0)
Monocytes Absolute: 0.4 10*3/uL (ref 0.1–1.0)
Monocytes Relative: 8 %
Neutro Abs: 3 10*3/uL (ref 1.7–7.7)
Neutrophils Relative %: 64 %
Platelets: 187 10*3/uL (ref 150–400)
RBC: 5.59 MIL/uL (ref 4.22–5.81)
RDW: 12.6 % (ref 11.5–15.5)
WBC: 4.7 10*3/uL (ref 4.0–10.5)
nRBC: 0 % (ref 0.0–0.2)

## 2021-01-30 LAB — COMPREHENSIVE METABOLIC PANEL
ALT: 12 U/L (ref 0–44)
AST: 14 U/L — ABNORMAL LOW (ref 15–41)
Albumin: 4.4 g/dL (ref 3.5–5.0)
Alkaline Phosphatase: 51 U/L (ref 38–126)
Anion gap: 9 (ref 5–15)
BUN: 16 mg/dL (ref 8–23)
CO2: 27 mmol/L (ref 22–32)
Calcium: 9.7 mg/dL (ref 8.9–10.3)
Chloride: 105 mmol/L (ref 98–111)
Creatinine, Ser: 0.95 mg/dL (ref 0.61–1.24)
GFR, Estimated: >60 mL/min (ref >60)
Glucose, Bld: 227 mg/dL — ABNORMAL HIGH (ref 70–99)
Potassium: 4.1 mmol/L (ref 3.5–5.1)
Sodium: 141 mmol/L (ref 135–145)
Total Bilirubin: 0.8 mg/dL (ref 0.3–1.2)
Total Protein: 7 g/dL (ref 6.5–8.1)

## 2021-01-30 LAB — TROPONIN I (HIGH SENSITIVITY): Troponin I (High Sensitivity): 3 ng/L (ref <18)

## 2021-01-30 MED ORDER — TETANUS-DIPHTH-ACELL PERTUSSIS 5-2.5-18.5 LF-MCG/0.5 IM SUSY
0.5000 mL | PREFILLED_SYRINGE | Freq: Once | INTRAMUSCULAR | Status: AC
  Administered 2021-01-30: 0.5 mL via INTRAMUSCULAR
  Filled 2021-01-30: qty 0.5

## 2021-01-30 NOTE — ED Notes (Signed)
Patient transported to CT 

## 2021-01-30 NOTE — ED Triage Notes (Signed)
Fall , tripped , landed foreward head injury . Syncope in lobby post fall. Diabetic . No blood thinner . Alert and oriented x 4

## 2021-01-30 NOTE — ED Notes (Signed)
Pt's CBG result was 240. Informed Pharmacist, hospital.
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
## 2021-01-30 NOTE — ED Notes (Signed)
Pt states he has taken his own dose of amlodpine, jardiance, and naltrexone.

## 2021-01-31 LAB — CBG MONITORING, ED: Glucose-Capillary: 240 mg/dL — ABNORMAL HIGH (ref 70–99)

## 2021-03-04 ENCOUNTER — Telehealth: Payer: Self-pay

## 2021-03-04 ENCOUNTER — Encounter (HOSPITAL_COMMUNITY): Payer: Self-pay | Admitting: Radiology

## 2021-03-04 DIAGNOSIS — M25511 Pain in right shoulder: Secondary | ICD-10-CM

## 2021-03-04 NOTE — Telephone Encounter (Signed)
Jonathan Cervantes came in today with his wife who had an ankle fracture.  Jonathan Cervantes sustained a fall at [employer] 5 weeks ago.  Had evaluation at that time with CT scan which was negative.  Also fell on his right shoulder in addition to the right side of his head and face.  Has history of left shoulder rotator cuff repair.  Has had significant pain and weakness in the right shoulder which has not improved over the last 5 weeks.  He denies any other orthopedic complaints or prior injury to that right shoulder.  Prior to the fall his shoulder was highly functional.  On exam he does have a little weakness to infraspinatus testing on the right compared to the left.  Coarse grinding present with internal and external rotation at 90 degrees of abduction.  X-rays reviewed and show no acute fracture.  That was 5 weeks ago.  Impression is highly likely based on exam with weakness and coarse grinding that Jonathan Cervantes has sustained a rotator cuff tear as a result of his fall 5 weeks ago.  Needs MRI arthrogram to more fully evaluate.  Examined him as part of his wife's clinic visit today but it is highly likely that he has sustained a rotator cuff tear on the right-hand side as a result of his fall 5 weeks ago.  I can see him in a formal visit after his MRI scan has been performed.

## 2021-03-04 NOTE — Telephone Encounter (Signed)
Patient having right shoulder pain-please advise.

## 2021-03-05 ENCOUNTER — Telehealth: Payer: Self-pay | Admitting: Orthopedic Surgery

## 2021-03-05 MED ORDER — TRAMADOL HCL 50 MG PO TABS: 50.0000 mg | ORAL_TABLET | Freq: Three times a day (TID) | ORAL | 0 refills | Status: DC | PRN

## 2021-03-05 NOTE — Telephone Encounter (Signed)
Left message for pt to return phone call to get sch'd for MR Arthrogram right shoulder review with Dr. Marlou Sa, after 03/26/21

## 2021-03-05 NOTE — Telephone Encounter (Signed)
Ok for tramadol 1 po q 8 # 40 pls clala thx

## 2021-03-05 NOTE — Telephone Encounter (Signed)
I called to pharmacy. I called patient and advised.

## 2021-03-05 NOTE — Telephone Encounter (Signed)
Patient called asked if he can get something for pain called  into his pharmacy. Patient said he uses the [facility] in [place]. The number to contact patient is [phone]

## 2021-03-26 ENCOUNTER — Other Ambulatory Visit: Payer: Self-pay

## 2021-03-26 ENCOUNTER — Inpatient Hospital Stay: Admission: RE | Admit: 2021-03-26 | Payer: Self-pay | Source: Ambulatory Visit

## 2021-03-28 ENCOUNTER — Ambulatory Visit: Payer: Self-pay | Admitting: Orthopedic Surgery

## 2021-05-23 ENCOUNTER — Other Ambulatory Visit: Payer: Self-pay

## 2021-05-23 ENCOUNTER — Ambulatory Visit
Admission: RE | Admit: 2021-05-23 | Discharge: 2021-05-23 | Disposition: A | Discharge status: Home / Self Care | Payer: 59 | Source: Ambulatory Visit | Attending: Orthopedic Surgery | Admitting: Orthopedic Surgery

## 2021-05-23 DIAGNOSIS — M25511 Pain in right shoulder: Secondary | ICD-10-CM

## 2021-05-23 MED ORDER — IOPAMIDOL (ISOVUE-M 200) INJECTION 41%
15.0000 mL | Freq: Once | INTRAMUSCULAR | Status: AC
  Administered 2021-05-23: 15 mL via INTRA_ARTICULAR

## 2021-05-27 ENCOUNTER — Other Ambulatory Visit: Payer: Self-pay

## 2021-05-27 ENCOUNTER — Ambulatory Visit (INDEPENDENT_AMBULATORY_CARE_PROVIDER_SITE_OTHER): Payer: Medicare Other | Admitting: Orthopedic Surgery

## 2021-05-27 DIAGNOSIS — S46011D Strain of muscle(s) and tendon(s) of the rotator cuff of right shoulder, subsequent encounter: Secondary | ICD-10-CM | POA: Diagnosis not present

## 2021-05-28 ENCOUNTER — Encounter: Payer: Self-pay | Admitting: Orthopedic Surgery
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
## 2021-05-30 ENCOUNTER — Encounter (HOSPITAL_COMMUNITY): Payer: Self-pay | Admitting: Radiology

## 2021-06-04 NOTE — Progress Notes (Signed)
Surgical Instructions    Your procedure is scheduled on Monday, February 20th.  Report to [facility] Main Entrance "A" at 5:30 A.M., then check in with the Admitting office.  Call this number if you have problems the morning of surgery:  [phone]   If you have any questions prior to your surgery date call [phone]: Open Monday-Friday 8am-4pm    Remember:  Do not eat after midnight the night before your surgery  You may drink clear liquids until 4:30 AM the morning of your surgery.   Clear liquids allowed are: Water, Non-Citrus Juices (without pulp), Carbonated Beverages, Clear Tea, Black Coffee ONLY (NO MILK, CREAM OR POWDERED CREAMER of any kind), and Gatorade    Take these medicines the morning of surgery with A SIP OF WATER:   Amlodipine (Norvasc)  Naltrexone  Tamsulosin (Flomax)   If needed:  Albuterol inhaler (bring with you on day of surgery)   As of today, STOP taking any Aspirin (unless otherwise instructed by your surgeon) Aleve, Naproxen, Ibuprofen, Motrin, Advil, Goody's, BC's, all herbal medications, fish oil, and all vitamins.  WHAT DO I DO ABOUT MY DIABETES MEDICATION?   Do not take oral diabetes medicines (pills) the morning of surgery. - Metformin, & Jardiance  THE DAY BEFORE SURGERY, do NOT take Jardiance.       HOW TO MANAGE YOUR DIABETES BEFORE AND AFTER SURGERY  Why is it important to control my blood sugar before and after surgery? Improving blood sugar levels before and after surgery helps healing and can limit problems. A way of improving blood sugar control is eating a healthy diet by:  Eating less sugar and carbohydrates  Increasing activity/exercise  Talking with your doctor about reaching your blood sugar goals High blood sugars (greater than 180 mg/dL) can raise your risk of infections and slow your recovery, so you will need to focus on controlling your diabetes during the weeks before surgery. Make sure that the doctor who takes care  of your diabetes knows about your planned surgery including the date and location.  How do I manage my blood sugar before surgery? Check your blood sugar at least 4 times a day, starting 2 days before surgery, to make sure that the level is not too high or low.  Check your blood sugar the morning of your surgery when you wake up and every 2 hours until you get to the Short Stay unit.  If your blood sugar is less than 70 mg/dL, you will need to treat for low blood sugar: Do not take insulin. Treat a low blood sugar (less than 70 mg/dL) with  cup of clear juice (cranberry or apple), 4 glucose tablets, OR glucose gel. Recheck blood sugar in 15 minutes after treatment (to make sure it is greater than 70 mg/dL). If your blood sugar is not greater than 70 mg/dL on recheck, call [phone] for further instructions. Report your blood sugar to the short stay nurse when you get to Short Stay.  If you are admitted to the hospital after surgery: Your blood sugar will be checked by the staff and you will probably be given insulin after surgery (instead of oral diabetes medicines) to make sure you have good blood sugar levels. The goal for blood sugar control after surgery is 80-180 mg/dL.   DAY OF SURGERY:         Do not wear jewelry  Do not wear lotions, powders, colognes, or deodorant. Men may shave face and neck. Do not bring  valuables to the hospital.  [facility] is not responsible for any belongings or valuables. .   Do NOT Smoke (Tobacco/Vaping)  24 hours prior to your procedure  If you use a CPAP at night, you may bring your mask for your overnight stay.   Contacts, glasses, hearing aids, dentures or partials may not be worn into surgery, please bring cases for these belongings   For patients admitted to the hospital, discharge time will be determined by your treatment team.   Patients discharged the day of surgery will not be allowed to drive home, and someone needs to stay with them  for 24 hours.  NO VISITORS WILL BE ALLOWED IN PRE-OP WHERE PATIENTS ARE PREPPED FOR SURGERY.  ONLY 1 SUPPORT PERSON MAY BE PRESENT IN THE WAITING ROOM WHILE YOU ARE IN SURGERY.  IF YOU ARE TO BE ADMITTED, ONCE YOU ARE IN YOUR ROOM YOU WILL BE ALLOWED TWO (2) VISITORS. 1 (ONE) VISITOR MAY STAY OVERNIGHT BUT MUST ARRIVE TO THE ROOM BY 8pm.  Minor children may have two parents present. Special consideration for safety and communication needs will be reviewed on a case by case basis.  Special instructions:    Oral Hygiene is also important to reduce your risk of infection.  Remember - BRUSH YOUR TEETH THE MORNING OF SURGERY WITH YOUR REGULAR TOOTHPASTE    [place]- Preparing For Surgery  Before surgery, you can play an important role. Because skin is not sterile, your skin needs to be as free of germs as possible. You can reduce the number of germs on your skin by washing with CHG (chlorahexidine gluconate) Soap before surgery.  CHG is an antiseptic cleaner which kills germs and bonds with the skin to continue killing germs even after washing.     Please do not use if you have an allergy to CHG or antibacterial soaps. If your skin becomes reddened/irritated stop using the CHG.  Do not shave (including legs and underarms) for at least 48 hours prior to first CHG shower. It is OK to shave your face.  Please follow these instructions carefully.     Shower the NIGHT BEFORE SURGERY and the MORNING OF SURGERY with CHG Soap.   If you chose to wash your hair, wash your hair first as usual with your normal shampoo. After you shampoo, rinse your hair and body thoroughly to remove the shampoo.  Then [employer] and genitals (private parts) with your normal soap and rinse thoroughly to remove soap.  After that Use CHG Soap as you would any other liquid soap. You can apply CHG directly to the skin and wash gently with a scrungie or a clean washcloth.   Apply the CHG Soap to your body ONLY FROM THE NECK  DOWN.  Do not use on open wounds or open sores. Avoid contact with your eyes, ears, mouth and genitals (private parts). Wash Face and genitals (private parts)  with your normal soap.   Wash thoroughly, paying special attention to the area where your surgery will be performed.  Thoroughly rinse your body with warm water from the neck down.  DO NOT shower/wash with your normal soap after using and rinsing off the CHG Soap.  Pat yourself dry with a CLEAN TOWEL.  Wear CLEAN PAJAMAS to bed the night before surgery  Place CLEAN SHEETS on your bed the night before your surgery  DO NOT SLEEP WITH PETS.   Day of Surgery:  Take a shower with CHG soap. Wear Clean/Comfortable  clothing the morning of surgery Do not apply any deodorants/lotions.   Remember to brush your teeth WITH YOUR REGULAR TOOTHPASTE.    Please read over the following fact sheets that you were given.

## 2021-06-05 ENCOUNTER — Encounter (HOSPITAL_COMMUNITY)
Admission: RE | Admit: 2021-06-05 | Discharge: 2021-06-05 | Disposition: A | Discharge status: Home / Self Care | Payer: Medicare Other | Source: Ambulatory Visit | Attending: Orthopedic Surgery | Admitting: Orthopedic Surgery

## 2021-06-05 ENCOUNTER — Encounter (HOSPITAL_COMMUNITY): Payer: Self-pay

## 2021-06-05 ENCOUNTER — Other Ambulatory Visit: Payer: Self-pay

## 2021-06-05 DIAGNOSIS — Z01812 Encounter for preprocedural laboratory examination: Secondary | ICD-10-CM | POA: Insufficient documentation

## 2021-06-05 DIAGNOSIS — Z01818 Encounter for other preprocedural examination: Secondary | ICD-10-CM

## 2021-06-05 DIAGNOSIS — E119 Type 2 diabetes mellitus without complications: Secondary | ICD-10-CM | POA: Insufficient documentation

## 2021-06-05 HISTORY — DX: Personal history of urinary calculi: Z87.442

## 2021-06-05 LAB — BASIC METABOLIC PANEL
Anion gap: 10 (ref 5–15)
BUN: 19 mg/dL (ref 8–23)
CO2: 25 mmol/L (ref 22–32)
Calcium: 9.8 mg/dL (ref 8.9–10.3)
Chloride: 103 mmol/L (ref 98–111)
Creatinine, Ser: 1.27 mg/dL — ABNORMAL HIGH (ref 0.61–1.24)
GFR, Estimated: >60 mL/min (ref >60)
Glucose, Bld: 219 mg/dL — ABNORMAL HIGH (ref 70–99)
Potassium: 4.4 mmol/L (ref 3.5–5.1)
Sodium: 138 mmol/L (ref 135–145)

## 2021-06-05 LAB — CBC
HCT: 49.9 % (ref 39.0–52.0)
Hemoglobin: 17.3 g/dL — ABNORMAL HIGH (ref 13.0–17.0)
MCH: 30.6 pg (ref 26.0–34.0)
MCHC: 34.7 g/dL (ref 30.0–36.0)
MCV: 88.2 fL (ref 80.0–100.0)
Platelets: 230 10*3/uL (ref 150–400)
RBC: 5.66 MIL/uL (ref 4.22–5.81)
RDW: 12.5 % (ref 11.5–15.5)
WBC: 6.6 10*3/uL (ref 4.0–10.5)
nRBC: 0 % (ref 0.0–0.2)

## 2021-06-05 LAB — HEMOGLOBIN A1C
Hgb A1c MFr Bld: 7.3 % — ABNORMAL HIGH (ref 4.8–5.6)
Mean Plasma Glucose: 162.81 mg/dL

## 2021-06-05 LAB — GLUCOSE, CAPILLARY: Glucose-Capillary: 299 mg/dL — ABNORMAL HIGH (ref 70–99)

## 2021-06-05 NOTE — Progress Notes (Addendum)
Surgical Instructions    Your procedure is scheduled on Monday, February 20th.  Report to [facility] Main Entrance "A" at 5:30 A.M., then check in with the Admitting office.  Call this number if you have problems the morning of surgery:  [phone]   If you have any questions prior to your surgery date call [phone]: Open Monday-Friday 8am-4pm    Remember:  Do not eat after midnight the night before your surgery  You may drink clear liquids until 4:30 AM the morning of your surgery.   Clear liquids allowed are: Water, Non-Citrus Juices (without pulp), Carbonated Beverages, Clear Tea, Black Coffee ONLY (NO MILK, CREAM OR POWDERED CREAMER of any kind), and Gatorade  Please complete your PRE-SURGERY G2 that was provided to you by 04:30 a.m. the morning of surgery.  Please, if able, drink it in one sitting. DO NOT SIP. Do not drink anything else after completing the Pre-Surgery G2.    Take these medicines the morning of surgery with A SIP OF WATER:   Amlodipine (Norvasc)  Tamsulosin (Flomax)   If needed:  Albuterol inhaler (bring with you on day of surgery)   As of today, STOP taking any Aspirin (unless otherwise instructed by your surgeon) Aleve, Naproxen, Ibuprofen, Motrin, Advil, Goody's, BC's, all herbal medications, fish oil, and all vitamins.  Hold Naltrexone 3 days prior to surgery  WHAT DO I DO ABOUT MY DIABETES MEDICATION?  Do not take oral diabetes medicines (pills) the morning of surgery. - Metformin, & Jardiance  THE DAY BEFORE SURGERY, do NOT take Jardiance.       HOW TO MANAGE YOUR DIABETES BEFORE AND AFTER SURGERY  Why is it important to control my blood sugar before and after surgery? Improving blood sugar levels before and after surgery helps healing and can limit problems. A way of improving blood sugar control is eating a healthy diet by:  Eating less sugar and carbohydrates  Increasing activity/exercise  Talking with your doctor about reaching your  blood sugar goals High blood sugars (greater than 180 mg/dL) can raise your risk of infections and slow your recovery, so you will need to focus on controlling your diabetes during the weeks before surgery. Make sure that the doctor who takes care of your diabetes knows about your planned surgery including the date and location.  How do I manage my blood sugar before surgery? Check your blood sugar at least 4 times a day, starting 2 days before surgery, to make sure that the level is not too high or low.  Check your blood sugar the morning of your surgery when you wake up and every 2 hours until you get to the Short Stay unit.  If your blood sugar is less than 70 mg/dL, you will need to treat for low blood sugar: Do not take insulin. Treat a low blood sugar (less than 70 mg/dL) with  cup of clear juice (cranberry or apple), 4 glucose tablets, OR glucose gel. Recheck blood sugar in 15 minutes after treatment (to make sure it is greater than 70 mg/dL). If your blood sugar is not greater than 70 mg/dL on recheck, call [phone] for further instructions. Report your blood sugar to the short stay nurse when you get to Short Stay.  If you are admitted to the hospital after surgery: Your blood sugar will be checked by the staff and you will probably be given insulin after surgery (instead of oral diabetes medicines) to make sure you have good blood sugar levels. The  goal for blood sugar control after surgery is 80-180 mg/dL.   DAY OF SURGERY:         Do not wear jewelry  Do not wear lotions, powders, colognes, or deodorant. Men may shave face and neck. Do not bring valuables to the hospital.  [facility] is not responsible for any belongings or valuables. .   Do NOT Smoke (Tobacco/Vaping)  24 hours prior to your procedure  If you use a CPAP at night, you may bring your mask for your overnight stay.   Contacts, glasses, hearing aids, dentures or partials may not be worn into surgery,  please bring cases for these belongings   For patients admitted to the hospital, discharge time will be determined by your treatment team.   Patients discharged the day of surgery will not be allowed to drive home, and someone needs to stay with them for 24 hours.  NO VISITORS WILL BE ALLOWED IN PRE-OP WHERE PATIENTS ARE PREPPED FOR SURGERY.  ONLY 1 SUPPORT PERSON MAY BE PRESENT IN THE WAITING ROOM WHILE YOU ARE IN SURGERY.  IF YOU ARE TO BE ADMITTED, ONCE YOU ARE IN YOUR ROOM YOU WILL BE ALLOWED TWO (2) VISITORS. 1 (ONE) VISITOR MAY STAY OVERNIGHT BUT MUST ARRIVE TO THE ROOM BY 8pm.  Minor children may have two parents present. Special consideration for safety and communication needs will be reviewed on a case by case basis.  Special instructions:    3 days leading up to your surgery  You are not required to quarantine however you are required to wear a well-fitting mask when you are out and around people not in your household.  If your mask becomes wet or soiled, replace with a new one.  Wash your hands often with soap and water for 20 seconds or clean your hands with an alcohol-based hand sanitizer that contains at least 60% alcohol.  Do not share personal items.  Notify your provider: if you are in close contact with someone who has COVID  or if you develop a fever of 100.4 or greater, sneezing, cough, sore throat, shortness of breath or body aches.   Oral Hygiene is also important to reduce your risk of infection.  Remember - BRUSH YOUR TEETH THE MORNING OF SURGERY WITH YOUR REGULAR TOOTHPASTE    - Preparing For Surgery  Before surgery, you can play an important role. Because skin is not sterile, your skin needs to be as free of germs as possible. You can reduce the number of germs on your skin by washing with CHG (chlorahexidine gluconate) Soap before surgery.  CHG is an antiseptic cleaner which kills germs and bonds with the skin to continue killing germs even after  washing.     Please do not use if you have an allergy to CHG or antibacterial soaps. If your skin becomes reddened/irritated stop using the CHG.  Do not shave (including legs and underarms) for at least 48 hours prior to first CHG shower. It is OK to shave your face.  Please follow these instructions carefully.     Shower the NIGHT BEFORE SURGERY and the MORNING OF SURGERY with CHG Soap.   If you chose to wash your hair, wash your hair first as usual with your normal shampoo. After you shampoo, rinse your hair and body thoroughly to remove the shampoo.  Then [employer] and genitals (private parts) with your normal soap and rinse thoroughly to remove soap.  After that Use CHG Soap as you  would any other liquid soap. You can apply CHG directly to the skin and wash gently with a scrungie or a clean washcloth.   Apply the CHG Soap to your body ONLY FROM THE NECK DOWN.  Do not use on open wounds or open sores. Avoid contact with your eyes, ears, mouth and genitals (private parts). Wash Face and genitals (private parts)  with your normal soap.   Wash thoroughly, paying special attention to the area where your surgery will be performed.  Thoroughly rinse your body with warm water from the neck down.  DO NOT shower/wash with your normal soap after using and rinsing off the CHG Soap.  Pat yourself dry with a CLEAN TOWEL.  Wear CLEAN PAJAMAS to bed the night before surgery  Place CLEAN SHEETS on your bed the night before your surgery  DO NOT SLEEP WITH PETS.   Day of Surgery:  Take a shower with CHG soap. Wear Clean/Comfortable clothing the morning of surgery Do not apply any deodorants/lotions.   Remember to brush your teeth WITH YOUR REGULAR TOOTHPASTE.    Please read over the following fact sheets that you were given.

## 2021-06-05 NOTE — Progress Notes (Signed)
PCP - Marcha Dutton, NP at [facility] - patient denies  PPM/ICD - n/a Device Orders -  Rep Notified -   Chest x-ray - 01/30/21 (CT) EKG - 02/01/21 Stress Test - patient denies ECHO - patient denies Cardiac Cath - patient denies  Sleep Study - patient denies, negative stop bang CPAP -   Fasting Blood Sugar - 140-170 Checks Blood Sugar 1 time a day CBG at PAT 299.  Patient stated he ate lunch shortly before arrival.  He ate sliced [place], beets, peas for lunch.  Patient educated to monitor his CBG leading up to his surgery and the importance of monitoring his carb/sugar intake.  Patient stated his last A1c a few months ago was 7.2 and that his PCP is aware that his CBG's at home have been running higher than normal.  Patient stated he and PCP are talking about possibly adjusting medications after surgery.  Blood Thinner Instructions: n/a Aspirin Instructions: n/a  ERAS Protcol - clears until 0430 PRE-SURGERY Ensure or G2- G2 given at PAT to be completed by 0430  COVID TEST- ambulatory surgery   Anesthesia review: n/a  Patient denies shortness of breath, fever, cough and chest pain at PAT appointment   All instructions explained to the patient, with a verbal understanding of the material. Patient agrees to go over the instructions while at home for a better understanding. Patient also instructed to self quarantine after being tested for COVID-19. The opportunity to ask questions was provided.

## 2021-06-07 ENCOUNTER — Encounter: Payer: Self-pay | Admitting: Orthopedic Surgery
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
## 2021-06-10 ENCOUNTER — Other Ambulatory Visit: Payer: Self-pay

## 2021-06-10 ENCOUNTER — Ambulatory Visit (HOSPITAL_COMMUNITY): Payer: Medicare Other | Admitting: Anesthesiology

## 2021-06-10 ENCOUNTER — Ambulatory Visit (HOSPITAL_BASED_OUTPATIENT_CLINIC_OR_DEPARTMENT_OTHER): Payer: Medicare Other | Admitting: Anesthesiology

## 2021-06-10 ENCOUNTER — Ambulatory Visit (HOSPITAL_COMMUNITY)
Admission: RE | Admit: 2021-06-10 | Discharge: 2021-06-10 | Disposition: A | Discharge status: Home / Self Care | Payer: Medicare Other | Attending: Orthopedic Surgery | Admitting: Orthopedic Surgery

## 2021-06-10 ENCOUNTER — Other Ambulatory Visit: Payer: Self-pay | Admitting: Surgical

## 2021-06-10 ENCOUNTER — Encounter (HOSPITAL_COMMUNITY): Payer: Self-pay | Admitting: Orthopedic Surgery

## 2021-06-10 ENCOUNTER — Encounter (HOSPITAL_COMMUNITY)
Admission: RE | Disposition: A | Discharge status: Home / Self Care | Payer: Self-pay | Source: Home / Self Care | Attending: Orthopedic Surgery

## 2021-06-10 DIAGNOSIS — M75101 Unspecified rotator cuff tear or rupture of right shoulder, not specified as traumatic: Secondary | ICD-10-CM

## 2021-06-10 DIAGNOSIS — E1151 Type 2 diabetes mellitus with diabetic peripheral angiopathy without gangrene: Secondary | ICD-10-CM | POA: Diagnosis not present

## 2021-06-10 DIAGNOSIS — I10 Essential (primary) hypertension: Secondary | ICD-10-CM | POA: Diagnosis not present

## 2021-06-10 DIAGNOSIS — M7521 Bicipital tendinitis, right shoulder: Secondary | ICD-10-CM

## 2021-06-10 DIAGNOSIS — J45909 Unspecified asthma, uncomplicated: Secondary | ICD-10-CM | POA: Insufficient documentation

## 2021-06-10 DIAGNOSIS — Z7984 Long term (current) use of oral hypoglycemic drugs: Secondary | ICD-10-CM | POA: Diagnosis not present

## 2021-06-10 DIAGNOSIS — X58XXXA Exposure to other specified factors, initial encounter: Secondary | ICD-10-CM | POA: Insufficient documentation

## 2021-06-10 DIAGNOSIS — M75121 Complete rotator cuff tear or rupture of right shoulder, not specified as traumatic: Secondary | ICD-10-CM

## 2021-06-10 DIAGNOSIS — M25511 Pain in right shoulder: Secondary | ICD-10-CM | POA: Diagnosis present

## 2021-06-10 DIAGNOSIS — S43431A Superior glenoid labrum lesion of right shoulder, initial encounter: Secondary | ICD-10-CM

## 2021-06-10 DIAGNOSIS — Z01818 Encounter for other preprocedural examination: Secondary | ICD-10-CM

## 2021-06-10 DIAGNOSIS — W19XXXA Unspecified fall, initial encounter: Secondary | ICD-10-CM | POA: Diagnosis not present

## 2021-06-10 DIAGNOSIS — M7522 Bicipital tendinitis, left shoulder: Secondary | ICD-10-CM

## 2021-06-10 DIAGNOSIS — E119 Type 2 diabetes mellitus without complications: Secondary | ICD-10-CM

## 2021-06-10 HISTORY — PX: SHOULDER ARTHROSCOPY WITH OPEN ROTATOR CUFF REPAIR AND DISTAL CLAVICLE ACROMINECTOMY: SHX5683

## 2021-06-10 LAB — GLUCOSE, CAPILLARY
Glucose-Capillary: 173 mg/dL — ABNORMAL HIGH (ref 70–99)
Glucose-Capillary: 190 mg/dL — ABNORMAL HIGH (ref 70–99)

## 2021-06-10 SURGERY — SHOULDER ARTHROSCOPY WITH OPEN ROTATOR CUFF REPAIR AND DISTAL CLAVICLE ACROMINECTOMY
Anesthesia: General | Site: Shoulder | Laterality: Right

## 2021-06-10 MED ORDER — MIDAZOLAM HCL 2 MG/2ML IJ SOLN
INTRAMUSCULAR | Status: DC | PRN
  Administered 2021-06-10: 2 mg via INTRAVENOUS

## 2021-06-10 MED ORDER — DEXAMETHASONE SODIUM PHOSPHATE 10 MG/ML IJ SOLN
INTRAMUSCULAR | Status: AC
  Filled 2021-06-10: qty 1

## 2021-06-10 MED ORDER — ONDANSETRON HCL 4 MG/2ML IJ SOLN
INTRAMUSCULAR | Status: AC
Start: 2021-06-10 — End: 2021-06-10
  Administered 2021-06-10: 4 mg via INTRAVENOUS
  Filled 2021-06-10: qty 2

## 2021-06-10 MED ORDER — POVIDONE-IODINE 10 % EX SWAB
2.0000 "application " | Freq: Once | CUTANEOUS | Status: AC
  Administered 2021-06-10: 2 via TOPICAL

## 2021-06-10 MED ORDER — LIDOCAINE 2% (20 MG/ML) 5 ML SYRINGE
INTRAMUSCULAR | Status: DC | PRN
  Administered 2021-06-10: 80 mg via INTRAVENOUS

## 2021-06-10 MED ORDER — 0.9 % SODIUM CHLORIDE (POUR BTL) OPTIME
TOPICAL | Status: DC | PRN
  Administered 2021-06-10: 1000 mL

## 2021-06-10 MED ORDER — PHENYLEPHRINE HCL-NACL 20-0.9 MG/250ML-% IV SOLN
INTRAVENOUS | Status: DC | PRN
  Administered 2021-06-10: 20 ug/min via INTRAVENOUS

## 2021-06-10 MED ORDER — LACTATED RINGERS IV SOLN: INTRAVENOUS | Status: DC

## 2021-06-10 MED ORDER — SODIUM CHLORIDE (PF) 0.9 % IJ SOLN
INTRAMUSCULAR | Status: AC
  Filled 2021-06-10: qty 50

## 2021-06-10 MED ORDER — CEFAZOLIN SODIUM-DEXTROSE 2-4 GM/100ML-% IV SOLN
2.0000 g | INTRAVENOUS | Status: AC
  Administered 2021-06-10: 2 g via INTRAVENOUS
  Filled 2021-06-10: qty 100

## 2021-06-10 MED ORDER — EPINEPHRINE PF 1 MG/ML IJ SOLN
INTRAMUSCULAR | Status: AC
  Filled 2021-06-10: qty 1

## 2021-06-10 MED ORDER — OXYCODONE HCL 5 MG/5ML PO SOLN: 5.0000 mg | Freq: Once | ORAL | Status: DC | PRN

## 2021-06-10 MED ORDER — VANCOMYCIN HCL 1000 MG IV SOLR
INTRAVENOUS | Status: AC
  Filled 2021-06-10: qty 20

## 2021-06-10 MED ORDER — MEPERIDINE HCL 25 MG/ML IJ SOLN: 6.2500 mg | INTRAMUSCULAR | Status: DC | PRN

## 2021-06-10 MED ORDER — TRANEXAMIC ACID-NACL 1000-0.7 MG/100ML-% IV SOLN
1000.0000 mg | INTRAVENOUS | Status: AC
  Administered 2021-06-10: 1000 mg via INTRAVENOUS
  Filled 2021-06-10: qty 100

## 2021-06-10 MED ORDER — BUPIVACAINE-EPINEPHRINE (PF) 0.5% -1:200000 IJ SOLN
INTRAMUSCULAR | Status: DC | PRN
  Administered 2021-06-10: 15 mL via PERINEURAL

## 2021-06-10 MED ORDER — DEXMEDETOMIDINE (PRECEDEX) IN NS 20 MCG/5ML (4 MCG/ML) IV SYRINGE
PREFILLED_SYRINGE | INTRAVENOUS | Status: DC | PRN
  Administered 2021-06-10: 8 ug via INTRAVENOUS

## 2021-06-10 MED ORDER — EPHEDRINE SULFATE-NACL 50-0.9 MG/10ML-% IV SOSY
PREFILLED_SYRINGE | INTRAVENOUS | Status: DC | PRN
Start: 2021-06-10 — End: 2021-06-10
  Administered 2021-06-10 (×5): 5 mg via INTRAVENOUS

## 2021-06-10 MED ORDER — PROPOFOL 10 MG/ML IV BOLUS
INTRAVENOUS | Status: DC | PRN
  Administered 2021-06-10: 160 mg via INTRAVENOUS

## 2021-06-10 MED ORDER — BUPIVACAINE LIPOSOME 1.3 % IJ SUSP
INTRAMUSCULAR | Status: DC | PRN
  Administered 2021-06-10: 10 mL via PERINEURAL

## 2021-06-10 MED ORDER — PHENYLEPHRINE 40 MCG/ML (10ML) SYRINGE FOR IV PUSH (FOR BLOOD PRESSURE SUPPORT)
PREFILLED_SYRINGE | INTRAVENOUS | Status: AC
  Filled 2021-06-10: qty 10

## 2021-06-10 MED ORDER — ROCURONIUM BROMIDE 10 MG/ML (PF) SYRINGE
PREFILLED_SYRINGE | INTRAVENOUS | Status: AC
  Filled 2021-06-10: qty 10

## 2021-06-10 MED ORDER — METHOCARBAMOL 500 MG PO TABS: 500.0000 mg | ORAL_TABLET | Freq: Three times a day (TID) | ORAL | 0 refills | Status: DC | PRN

## 2021-06-10 MED ORDER — ONDANSETRON HCL 4 MG/2ML IJ SOLN: 4.0000 mg | Freq: Once | INTRAMUSCULAR | Status: AC | PRN

## 2021-06-10 MED ORDER — CHLORHEXIDINE GLUCONATE 0.12 % MT SOLN
15.0000 mL | Freq: Once | OROMUCOSAL | Status: AC
  Administered 2021-06-10: 15 mL via OROMUCOSAL
  Filled 2021-06-10: qty 15

## 2021-06-10 MED ORDER — OXYCODONE HCL 5 MG PO TABS: 5.0000 mg | ORAL_TABLET | Freq: Once | ORAL | Status: DC | PRN

## 2021-06-10 MED ORDER — FENTANYL CITRATE (PF) 100 MCG/2ML IJ SOLN: 25.0000 ug | INTRAMUSCULAR | Status: DC | PRN

## 2021-06-10 MED ORDER — DEXAMETHASONE SODIUM PHOSPHATE 10 MG/ML IJ SOLN
INTRAMUSCULAR | Status: DC | PRN
  Administered 2021-06-10: 5 mg via INTRAVENOUS

## 2021-06-10 MED ORDER — FENTANYL CITRATE (PF) 250 MCG/5ML IJ SOLN
INTRAMUSCULAR | Status: AC
  Filled 2021-06-10: qty 5

## 2021-06-10 MED ORDER — ROCURONIUM BROMIDE 10 MG/ML (PF) SYRINGE
PREFILLED_SYRINGE | INTRAVENOUS | Status: DC | PRN
  Administered 2021-06-10: 20 mg via INTRAVENOUS
  Administered 2021-06-10: 30 mg via INTRAVENOUS
  Administered 2021-06-10: 70 mg via INTRAVENOUS

## 2021-06-10 MED ORDER — OXYCODONE HCL 5 MG PO TABS: 5.0000 mg | ORAL_TABLET | ORAL | 0 refills | Status: DC | PRN

## 2021-06-10 MED ORDER — LIDOCAINE 2% (20 MG/ML) 5 ML SYRINGE
INTRAMUSCULAR | Status: AC
  Filled 2021-06-10: qty 5

## 2021-06-10 MED ORDER — PROPOFOL 10 MG/ML IV BOLUS
INTRAVENOUS | Status: AC
  Filled 2021-06-10: qty 20

## 2021-06-10 MED ORDER — CEFAZOLIN SODIUM-DEXTROSE 2-4 GM/100ML-% IV SOLN: 2.0000 g | Freq: Once | INTRAVENOUS | Status: DC

## 2021-06-10 MED ORDER — ONDANSETRON HCL 4 MG/2ML IJ SOLN
INTRAMUSCULAR | Status: DC | PRN
  Administered 2021-06-10: 4 mg via INTRAVENOUS

## 2021-06-10 MED ORDER — MIDAZOLAM HCL 2 MG/2ML IJ SOLN
INTRAMUSCULAR | Status: AC
  Filled 2021-06-10: qty 2

## 2021-06-10 MED ORDER — INSULIN ASPART 100 UNIT/ML IJ SOLN: 0.0000 [IU] | INTRAMUSCULAR | Status: DC | PRN

## 2021-06-10 MED ORDER — VANCOMYCIN HCL 1000 MG IV SOLR
INTRAVENOUS | Status: DC | PRN
  Administered 2021-06-10: 1000 mg via TOPICAL

## 2021-06-10 MED ORDER — SODIUM CHLORIDE 0.9 % IR SOLN
Status: DC | PRN
  Administered 2021-06-10: 3000 mL

## 2021-06-10 MED ORDER — ACETAMINOPHEN 160 MG/5ML PO SOLN: 325.0000 mg | ORAL | Status: DC | PRN

## 2021-06-10 MED ORDER — POVIDONE-IODINE 7.5 % EX SOLN
Freq: Once | CUTANEOUS | Status: AC
  Filled 2021-06-10: qty 118

## 2021-06-10 MED ORDER — INSULIN ASPART 100 UNIT/ML IJ SOLN
INTRAMUSCULAR | Status: AC
  Administered 2021-06-10: 2 [IU] via SUBCUTANEOUS
  Filled 2021-06-10: qty 1

## 2021-06-10 MED ORDER — DEXMEDETOMIDINE (PRECEDEX) IN NS 20 MCG/5ML (4 MCG/ML) IV SYRINGE
PREFILLED_SYRINGE | INTRAVENOUS | Status: AC
  Filled 2021-06-10: qty 5

## 2021-06-10 MED ORDER — SUGAMMADEX SODIUM 200 MG/2ML IV SOLN
INTRAVENOUS | Status: DC | PRN
  Administered 2021-06-10: 400 mg via INTRAVENOUS

## 2021-06-10 MED ORDER — ONDANSETRON HCL 4 MG/2ML IJ SOLN
INTRAMUSCULAR | Status: AC
  Filled 2021-06-10: qty 2

## 2021-06-10 MED ORDER — PHENYLEPHRINE 40 MCG/ML (10ML) SYRINGE FOR IV PUSH (FOR BLOOD PRESSURE SUPPORT)
PREFILLED_SYRINGE | INTRAVENOUS | Status: DC | PRN
  Administered 2021-06-10: 40 ug via INTRAVENOUS
  Administered 2021-06-10 (×3): 80 ug via INTRAVENOUS
  Administered 2021-06-10 (×2): 40 ug via INTRAVENOUS

## 2021-06-10 MED ORDER — EPHEDRINE 5 MG/ML INJ
INTRAVENOUS | Status: AC
  Filled 2021-06-10: qty 5

## 2021-06-10 MED ORDER — FENTANYL CITRATE (PF) 250 MCG/5ML IJ SOLN
INTRAMUSCULAR | Status: DC | PRN
  Administered 2021-06-10: 50 ug via INTRAVENOUS

## 2021-06-10 MED ORDER — CELECOXIB 100 MG PO CAPS: 100.0000 mg | ORAL_CAPSULE | Freq: Two times a day (BID) | ORAL | 0 refills | Status: AC

## 2021-06-10 MED ORDER — ORAL CARE MOUTH RINSE: 15.0000 mL | Freq: Once | OROMUCOSAL | Status: AC

## 2021-06-10 MED ORDER — ACETAMINOPHEN 325 MG PO TABS: 325.0000 mg | ORAL_TABLET | ORAL | Status: DC | PRN

## 2021-06-10 SURGICAL SUPPLY — 82 items
AID PSTN UNV HD RSTRNT DISP (MISCELLANEOUS) ×1
ANCH SUT 2 FBRTK KNTLS 1.8 (Anchor) ×1 IMPLANT
ANCH SUT 2 SWLK 19.1 CLS EYLT (Anchor) ×3 IMPLANT
ANCH SUT CRKSW FT 1.3X (Anchor) ×1 IMPLANT
ANCH SUT FBRTK 1.3 2 TPE (Anchor) ×2 IMPLANT
ANCHOR FBRTK 2.6 SUTURETAP 1.3 (Anchor) ×2 IMPLANT
ANCHOR SUT 1.8 FBRTK KNTLS 2SU (Anchor) ×1 IMPLANT
ANCHOR SUT 1.8 FIBERTAK SB KL (Anchor) ×1 IMPLANT
ANCHOR SUT BIOCOMP CORKSREW (Anchor) ×1 IMPLANT
ANCHOR SWIVELOCK BIO 4.75X19.1 (Anchor) ×3 IMPLANT
BAG COUNTER SPONGE SURGICOUNT (BAG) ×2 IMPLANT
BAG SPNG CNTER NS LX DISP (BAG) ×1
BLADE EXCALIBUR 4.0X13 (MISCELLANEOUS) IMPLANT
BLADE SURG 11 STRL SS (BLADE) ×2 IMPLANT
BUR OVAL 6.0 (BURR) IMPLANT
COVER SURGICAL LIGHT HANDLE (MISCELLANEOUS) ×2 IMPLANT
DRAPE INCISE IOBAN 66X45 STRL (DRAPES) ×4 IMPLANT
DRAPE STERI 35X30 U-POUCH (DRAPES) ×2 IMPLANT
DRAPE U-SHAPE 47X51 STRL (DRAPES) ×4 IMPLANT
DRSG AQUACEL AG ADV 3.5X 4 (GAUZE/BANDAGES/DRESSINGS) ×3 IMPLANT
DRSG AQUACEL AG ADV 3.5X 6 (GAUZE/BANDAGES/DRESSINGS) ×1 IMPLANT
DRSG PAD ABDOMINAL 8X10 ST (GAUZE/BANDAGES/DRESSINGS) ×3 IMPLANT
DRSG TEGADERM 4X4.75 (GAUZE/BANDAGES/DRESSINGS) ×5 IMPLANT
DURAPREP 26ML APPLICATOR (WOUND CARE) ×2 IMPLANT
DW OUTFLOW CASSETTE/TUBE SET (MISCELLANEOUS) ×1 IMPLANT
ELECT REM PT RETURN 9FT ADLT (ELECTROSURGICAL) ×2
ELECTRODE REM PT RTRN 9FT ADLT (ELECTROSURGICAL) ×1 IMPLANT
FILTER STRAW FLUID ASPIR (MISCELLANEOUS) ×2 IMPLANT
GAUZE SPONGE 4X4 12PLY STRL (GAUZE/BANDAGES/DRESSINGS) ×1 IMPLANT
GAUZE SPONGE 4X4 12PLY STRL LF (GAUZE/BANDAGES/DRESSINGS) ×1 IMPLANT
GAUZE XEROFORM 1X8 LF (GAUZE/BANDAGES/DRESSINGS) ×2 IMPLANT
GLOVE SRG 8 PF TXTR STRL LF DI (GLOVE) ×1 IMPLANT
GLOVE SURG LTX SZ8 (GLOVE) ×2 IMPLANT
GLOVE SURG UNDER POLY LF SZ8 (GLOVE) ×2
GOWN STRL REUS W/ TWL LRG LVL3 (GOWN DISPOSABLE) ×3 IMPLANT
GOWN STRL REUS W/TWL LRG LVL3 (GOWN DISPOSABLE) ×6
IV NS IRRIG 3000ML ARTHROMATIC (IV SOLUTION) ×1 IMPLANT
KIT BASIN OR (CUSTOM PROCEDURE TRAY) ×2 IMPLANT
KIT STR SPEAR 1.8 FBRTK DISP (KITS) ×1 IMPLANT
KIT TURNOVER KIT B (KITS) ×2 IMPLANT
MANIFOLD NEPTUNE II (INSTRUMENTS) ×2 IMPLANT
NDL HYPO 25X1 1.5 SAFETY (NEEDLE) ×1 IMPLANT
NDL SCORPION MULTI FIRE (NEEDLE) IMPLANT
NDL SPNL 18GX3.5 QUINCKE PK (NEEDLE) ×1 IMPLANT
NDL SUT 6 .5 CRC .975X.05 MAYO (NEEDLE) IMPLANT
NEEDLE HYPO 25X1 1.5 SAFETY (NEEDLE) ×2 IMPLANT
NEEDLE MAYO TAPER (NEEDLE)
NEEDLE SCORPION MULTI FIRE (NEEDLE) ×2 IMPLANT
NEEDLE SPNL 18GX3.5 QUINCKE PK (NEEDLE) ×2 IMPLANT
NS IRRIG 1000ML POUR BTL (IV SOLUTION) ×2 IMPLANT
PACK SHOULDER (CUSTOM PROCEDURE TRAY) ×2 IMPLANT
PAD ARMBOARD 7.5X6 YLW CONV (MISCELLANEOUS) ×4 IMPLANT
PORT APPOLLO RF 90DEGREE MULTI (SURGICAL WAND) ×1 IMPLANT
RESTRAINT HEAD UNIVERSAL NS (MISCELLANEOUS) ×2 IMPLANT
SLING ARM IMMOBILIZER MED (SOFTGOODS) IMPLANT
SLING ARM IMMOBILIZER XL (CAST SUPPLIES) ×1 IMPLANT
SPONGE T-LAP 4X18 ~~LOC~~+RFID (SPONGE) ×6 IMPLANT
STRIP CLOSURE SKIN 1/2X4 (GAUZE/BANDAGES/DRESSINGS) ×2 IMPLANT
SUCTION FRAZIER HANDLE 10FR (MISCELLANEOUS) ×2
SUCTION TUBE FRAZIER 10FR DISP (MISCELLANEOUS) ×1 IMPLANT
SUT 0 FIBERLOOP 38 BLUE TPR ND (SUTURE) ×8
SUT ETHILON 3 0 PS 1 (SUTURE) ×2 IMPLANT
SUT FIBERWIRE #2 38 T-5 BLUE (SUTURE)
SUT MNCRL AB 3-0 PS2 18 (SUTURE) ×2 IMPLANT
SUT VIC AB 0 CT1 27 (SUTURE) ×4
SUT VIC AB 0 CT1 27XBRD ANBCTR (SUTURE) ×1 IMPLANT
SUT VIC AB 1 CT1 27 (SUTURE) ×2
SUT VIC AB 1 CT1 27XBRD ANBCTR (SUTURE) ×1 IMPLANT
SUT VIC AB 2-0 CT1 27 (SUTURE) ×2
SUT VIC AB 2-0 CT1 TAPERPNT 27 (SUTURE) ×1 IMPLANT
SUT VICRYL 0 UR6 27IN ABS (SUTURE) ×2 IMPLANT
SUTURE 0 FIBERLP 38 BLU TPR ND (SUTURE) IMPLANT
SUTURE FIBERWR #2 38 T-5 BLUE (SUTURE) IMPLANT
SYR 20ML LL LF (SYRINGE) ×4 IMPLANT
SYR 3ML LL SCALE MARK (SYRINGE) ×2 IMPLANT
SYR TB 1ML LUER SLIP (SYRINGE) ×1 IMPLANT
SYS FBRTK BUTTON 2.6 (Anchor) ×2 IMPLANT
SYSTEM FBRTK BUTTON 2.6 (Anchor) IMPLANT
TOWEL GREEN STERILE (TOWEL DISPOSABLE) ×2 IMPLANT
TOWEL GREEN STERILE FF (TOWEL DISPOSABLE) ×2 IMPLANT
TUBING ARTHROSCOPY IRRIG 16FT (MISCELLANEOUS) ×2 IMPLANT
WATER STERILE IRR 1000ML POUR (IV SOLUTION) ×2 IMPLANT

## 2021-06-10 NOTE — Transfer of Care (Signed)
Immediate Anesthesia Transfer of Care Note  Patient: Tysean Vandervliet.  Procedure(s) Performed: RIGHT SHOULDER ARTHROSCOPY, DEBRIDEMENT, BICEPS TENODESIS, MINI OPEN ROTATOR CUFF TEAR REPAIR (Right: Shoulder)  Patient Location: PACU  Anesthesia Type:General and Regional  Level of Consciousness: drowsy  Airway & Oxygen Therapy: Patient Spontanous Breathing and Patient connected to face mask oxygen  Post-op Assessment: Report given to RN and Post -op Vital signs reviewed and stable  Post vital signs: Reviewed and stable  Last Vitals:  Vitals Value Taken Time  BP 120/68 06/10/21 1044  Temp    Pulse 84 06/10/21 1046  Resp 17 06/10/21 1046  SpO2 93 % 06/10/21 1046  Vitals shown include unvalidated device data.  Last Pain:  Vitals:   06/10/21 0607  TempSrc:   PainSc: 5       Patients Stated Pain Goal: 3 (65/79/03 8333)  Complications: No notable events documented.

## 2021-06-10 NOTE — Anesthesia Postprocedure Evaluation (Signed)
Anesthesia Post Note  Patient: Zaion Hreha.  Procedure(s) Performed: RIGHT SHOULDER ARTHROSCOPY, DEBRIDEMENT, BICEPS TENODESIS, MINI OPEN ROTATOR CUFF TEAR REPAIR (Right: Shoulder)     Patient location during evaluation: PACU Anesthesia Type: General Level of consciousness: awake and alert Pain management: pain level controlled Vital Signs Assessment: post-procedure vital signs reviewed and stable Respiratory status: spontaneous breathing, nonlabored ventilation, respiratory function stable and patient connected to nasal cannula oxygen Cardiovascular status: blood pressure returned to baseline and stable Postop Assessment: no apparent nausea or vomiting Anesthetic complications: no   No notable events documented.  Last Vitals:  Vitals:   06/10/21 1115 06/10/21 1125  BP: 119/68   Pulse: 88 90  Resp: 19 15  Temp:  36.7 C  SpO2: 91% 93%    Last Pain:  Vitals:   06/10/21 1115  TempSrc:   PainSc: 0-No pain                 Rainey Rodger

## 2021-06-10 NOTE — Anesthesia Procedure Notes (Signed)
Procedure Name: Intubation Date/Time: 06/10/2021 7:52 AM Performed by: Vonna Drafts, CRNA Pre-anesthesia Checklist: Patient identified, Emergency Drugs available, Suction available and Patient being monitored Patient Re-evaluated:Patient Re-evaluated prior to induction Oxygen Delivery Method: Circle system utilized Preoxygenation: Pre-oxygenation with 100% oxygen Induction Type: IV induction Ventilation: Mask ventilation without difficulty Laryngoscope Size: Mac and 4 Grade View: Grade II Tube type: Oral Tube size: 7.5 mm Number of attempts: 1 Airway Equipment and Method: Stylet and Oral airway Placement Confirmation: ETT inserted through vocal cords under direct vision, positive ETCO2 and breath sounds checked- equal and bilateral Secured at: 23 cm Tube secured with: Tape Dental Injury: Teeth and Oropharynx as per pre-operative assessment

## 2021-06-10 NOTE — Brief Op Note (Signed)
° °  06/10/2021  10:52 AM  PATIENT:  Jonathan Cervantes.  71 y.o. male  PRE-OPERATIVE DIAGNOSIS:  right shoulder rotator cuff tear, biceps tendonitis  POST-OPERATIVE DIAGNOSIS:  right shoulder rotator cuff tear, biceps tendonitis  PROCEDURE:  Procedure(s): RIGHT SHOULDER ARTHROSCOPY, DEBRIDEMENT, BICEPS TENODESIS, MINI OPEN ROTATOR CUFF TEAR REPAIR  SURGEON:  Surgeon(s): Meredith Pel, MD  ASSISTANT: magnant pa  ANESTHESIA:   general  EBL: 15 ml    Total I/O In: 1500 [I.V.:1300; IV Piggyback:200] Out: 25 [Blood:25]  BLOOD ADMINISTERED: none  DRAINS: none   LOCAL MEDICATIONS USED:  vanco  SPECIMEN:  No Specimen  COUNTS:  YES  TOURNIQUET:  * No tourniquets in log *  DICTATION: .Other Dictation: Dictation Number [id number]  PLAN OF CARE: Discharge to home after PACU  PATIENT DISPOSITION:  PACU - hemodynamically stable

## 2021-06-10 NOTE — Progress Notes (Signed)
I called and spoke with patient.  He states that he is doing well following his surgery this morning.  Pain is currently controlled and block is still in effect.  Discussed his anaphylaxis reaction to morphine and he states that he has taken oxycodone in the past without reaction.  Plan to prescribe oxycodone for breakthrough pain to only take as needed.  He will call the office with any concerns between now and his first postop appointment next Monday.

## 2021-06-10 NOTE — Op Note (Signed)
NAMEBURCH, [place] MEDICAL RECORD NO: [id number] ACCOUNT NO: [id number] DATE OF BIRTH: 10-Nov-1950 FACILITY: [facility] LOCATION: [facility] PHYSICIAN: Yetta Barre. Marlou Sa, MD  Operative Report   DATE OF PROCEDURE: 06/10/2021  PREOPERATIVE DIAGNOSES: 1.  Large rotator cuff tear involving infraspinatus, supraspinatus. 2.  Biceps tendinitis and degenerative superior labral anterior posterior tear.    POSTOPERATIVE DIAGNOSES: 1.  Large rotator cuff tear involving infraspinatus, supraspinatus. 2.  Biceps tendinitis and degenerative superior labral anterior posterior tear.    PROCEDURES:   1.  Arthroscopy with limited debridement of the superior labrum and release of the biceps tendon. 2.  Mini open biceps tenodesis using Arthrex knotless suture anchors x2. 3.  Mini open rotator cuff tear repair of A/U-shaped tear of the infraspinatus and supraspinatus using margin convergence and 2 medial anchors and 3 lateral SwiveLocks.  SURGEON:  Yetta Barre. Marlou Sa, MD.  ASSISTANT:  Annie Main, PA.  INDICATIONS:  Bodi is a 71 year old patient with right shoulder pain following a fall.  He has a rotator cuff tear and biceps tendinitis.  Presents for operative management after explanation of risks and benefits.  DESCRIPTION OF PROCEDURE:  The patient was brought to the operating room where general anesthetic was induced.  Preoperative antibiotics administered.  Timeout was called.  The patient was placed in the beach chair position with his head in neutral  position.  Right shoulder, arm and hand prescrubbed with alcohol and Betadine, allowed to air dry, prepped with DuraPrep solution and draped in sterile manner.  Ioban used to cover the operative field.  The patient did have multiple superficial allergic  skin reaction lesions, which were not purulent or open.  These lesions were areas of reactive inflammation measuring about 3 x 4 mm.  They were not in the arthroscopic field.  The one, which was in the incisional  field for the mini open approach was  excised.  Following sterile prepping and draping, timeout was called, the patient's shoulder was examined under anesthesia and found to have passive range of motion of 65/95/165.  Shoulder stability was good.  After calling timeout, a posterior portal  was created 2 cm medial and inferior to the posterolateral margin of acromion.  Diagnostic arthroscopy was performed.  Anterior portal created under direct visualization.  The patient had degenerative SLAP tear, which was released with the Arthrocare  wand.  Debridement performed of the degenerative labrum and the biceps tendon was released.  Rotator cuff tear was visualized.  Had a reasonable mobilization.  Next, the anterior inferior, posterior inferior glenohumeral ligaments were intact.   Glenohumeral articular surfaces intact.  Instruments were removed.  Portals were closed using 3-0 Vicryl and layer were closed using 3-0 nylon.  Incision made off the anterolateral margin of the acromion, measured distance of 5 cm.  A small allergic  reaction skin lesion was excised and removed.  The full thickness skin flaps were developed.  A stay suture was placed between the anterior middle heads of the deltoid and marked with a #1 Vicryl suture.  Next, the bursectomy was performed.  The rotator  cuff tear was identified.  A self-retaining retractor was placed.  Biceps tendon was then tenodesed in the bicipital groove under appropriate tension using two 1.9 mm knotless SutureTaks.  Oversewn x3 with a 0 Vicryl suture.  Secure, appropriately  tensioned.  Biceps tenodesis was achieved.  Next, attention was directed towards the rotator cuff tear.  Acromioplasty performed with a rasp.  Next, the rotator cuff footprint was prepared.  The patient had an A shaped/U-shaped type tear extending about  3.5-4 cm from the attachment site on the lateral tuberosity.  Using 5-0 FiberWire sutures, margin convergence was performed.  These sutures  were not tied.  Once good margin convergence could be visualized, a suture anchor was placed at the intersection  between the greater tuberosity and the humeral head articular surface inline with the infraspinatus flap of the rotator cuff tendon tear.  A second anchor was placed in a similar manner along the anterior line of the supraspinatus tendon tear.  A second  anchor did pull out and required replacement with an Arthrex corkscrew.  This did achieve good fixation.  Next, the margin convergence sutures were tied and the limbs were maintained.  This gave a very nice closure of the A frame portion of the rotator  cuff tendon tear.  Next, the 8 suture limbs, which had been passed using a Scorpion through the rotator cuff tendon were then tied x4 and limbs were crossed.  Accessory Vicryl suture placed in the most lateral flaps of the rotator cuff tendon to make  sure we achieve a watertight repair.  Next, the 0 FiberWire sutures were placed into a SwiveLock placed laterally, with the arm in abduction.  This gave very secure initial repair.  The 2 suture tapes and Vicryl sutures were then crossed and in two  separate SwiveLocks, they were placed with very good watertight repair achieved.  Bone quality was somewhat poor in the lateral aspect of the tuberosity.  For that reason, we will keep him in a sling for 4 weeks.  All in all, stable fixation achieved and  the arm was taken through a passive range of motion and there were no issues with the hardware.  Next, a thorough irrigation was performed with 3 liters of irrigating solution.  Vancomycin powder placed.  Deltoid split closed using #1 Vicryl suture  followed by interrupted inverted 0 Vicryl suture, 2-0 Vicryl suture, and 3-0 Monocryl.  Nylon suture was used to close the portals and incisions were then covered using Aquacel and a medium to extra-large shoulder immobilizer placed.  The patient  tolerated the procedure well without immediate complication  and transferred to recovery room in stable condition.  Luke's assistance was required at all times for retraction, mobilization of tissue, opening, closing.  His assistance was a medical  necessity.   CHR D: 06/10/2021 11:00:20 am T: 06/10/2021 12:03:00 pm  JOB: [id number]/ [id number]
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
## 2021-06-10 NOTE — Anesthesia Procedure Notes (Signed)
Anesthesia Regional Block: Interscalene brachial plexus block   Pre-Anesthetic Checklist: , timeout performed,  Correct Patient, Correct Site, Correct Laterality,  Correct Procedure, Correct Position, site marked,  Risks and benefits discussed,  Surgical consent,  Pre-op evaluation,  At surgeon's request and post-op pain management  Laterality: Right  Prep: chloraprep       Needles:  Injection technique: Single-shot  Needle Type: Echogenic Stimulator Needle     Needle Length: 5cm  Needle Gauge: 22     Additional Needles:   Procedures:, nerve stimulator,,, ultrasound used (permanent image in chart),,     Nerve Stimulator or Paresthesia:  Response: hand, 0.45 mA  Additional Responses:   Narrative:  Start time: 06/10/2021 7:00 AM End time: 06/10/2021 7:10 AM Injection made incrementally with aspirations every 5 mL.  Performed by: Personally  Anesthesiologist: Janeece Riggers, MD  Additional Notes: Functioning IV was confirmed and monitors were applied.  A 10mm 22ga Arrow echogenic stimulator needle was used. Sterile prep and drape,hand hygiene and sterile gloves were used. Ultrasound guidance: relevant anatomy identified, needle position confirmed, local anesthetic spread visualized around nerve(s)., vascular puncture avoided.  Image printed for medical record. Negative aspiration and negative test dose prior to incremental administration of local anesthetic. The patient tolerated the procedure well.

## 2021-06-12 ENCOUNTER — Encounter (HOSPITAL_COMMUNITY): Payer: Self-pay | Admitting: Orthopedic Surgery

## 2021-06-15 DIAGNOSIS — M75121 Complete rotator cuff tear or rupture of right shoulder, not specified as traumatic: Secondary | ICD-10-CM

## 2021-06-15 DIAGNOSIS — S43431A Superior glenoid labrum lesion of right shoulder, initial encounter: Secondary | ICD-10-CM

## 2021-06-15 DIAGNOSIS — M7522 Bicipital tendinitis, left shoulder: Secondary | ICD-10-CM

## 2021-06-17 ENCOUNTER — Encounter: Payer: Self-pay | Admitting: Orthopedic Surgery

## 2021-06-17 ENCOUNTER — Other Ambulatory Visit: Payer: Self-pay

## 2021-06-17 ENCOUNTER — Ambulatory Visit (INDEPENDENT_AMBULATORY_CARE_PROVIDER_SITE_OTHER): Payer: Medicare Other | Admitting: Orthopedic Surgery

## 2021-06-17 DIAGNOSIS — M25511 Pain in right shoulder: Secondary | ICD-10-CM

## 2021-06-17 DIAGNOSIS — S46011D Strain of muscle(s) and tendon(s) of the rotator cuff of right shoulder, subsequent encounter: Secondary | ICD-10-CM
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
## 2021-07-02 ENCOUNTER — Other Ambulatory Visit: Payer: Self-pay

## 2021-07-02 ENCOUNTER — Ambulatory Visit (INDEPENDENT_AMBULATORY_CARE_PROVIDER_SITE_OTHER): Payer: Medicare HMO | Admitting: Rehabilitative and Restorative Service Providers"

## 2021-07-02 ENCOUNTER — Encounter: Payer: Self-pay | Admitting: Rehabilitative and Restorative Service Providers"

## 2021-07-02 DIAGNOSIS — M25511 Pain in right shoulder: Secondary | ICD-10-CM | POA: Diagnosis not present

## 2021-07-02 DIAGNOSIS — M6281 Muscle weakness (generalized): Secondary | ICD-10-CM | POA: Diagnosis not present

## 2021-07-02 DIAGNOSIS — R293 Abnormal posture: Secondary | ICD-10-CM

## 2021-07-02 DIAGNOSIS — M25611 Stiffness of right shoulder, not elsewhere classified: Secondary | ICD-10-CM

## 2021-07-02 DIAGNOSIS — R600 Localized edema: Secondary | ICD-10-CM

## 2021-07-02 DIAGNOSIS — G8929 Other chronic pain: Secondary | ICD-10-CM

## 2021-07-03 ENCOUNTER — Encounter: Payer: Self-pay | Admitting: Orthopedic Surgery

## 2021-07-03 ENCOUNTER — Ambulatory Visit (INDEPENDENT_AMBULATORY_CARE_PROVIDER_SITE_OTHER): Payer: Medicare HMO | Admitting: Orthopedic Surgery

## 2021-07-03 DIAGNOSIS — R5383 Other fatigue: Secondary | ICD-10-CM | POA: Diagnosis not present

## 2021-07-03 DIAGNOSIS — S46011D Strain of muscle(s) and tendon(s) of the rotator cuff of right shoulder, subsequent encounter: Secondary | ICD-10-CM

## 2021-07-03 MED ORDER — TEMAZEPAM 15 MG PO CAPS: 15.0000 mg | ORAL_CAPSULE | Freq: Every evening | ORAL | 0 refills | Status: DC | PRN
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
## 2021-07-05 ENCOUNTER — Encounter: Payer: Self-pay | Admitting: Physical Therapy

## 2021-07-08 ENCOUNTER — Other Ambulatory Visit: Payer: Self-pay

## 2021-07-08 ENCOUNTER — Encounter: Payer: Self-pay | Admitting: Rehabilitative and Restorative Service Providers"

## 2021-07-08 ENCOUNTER — Ambulatory Visit: Payer: Medicare HMO | Admitting: Rehabilitative and Restorative Service Providers"

## 2021-07-08 DIAGNOSIS — M25611 Stiffness of right shoulder, not elsewhere classified: Secondary | ICD-10-CM | POA: Diagnosis not present

## 2021-07-08 DIAGNOSIS — M6281 Muscle weakness (generalized): Secondary | ICD-10-CM | POA: Diagnosis not present

## 2021-07-08 DIAGNOSIS — G8929 Other chronic pain: Secondary | ICD-10-CM | POA: Diagnosis not present

## 2021-07-08 DIAGNOSIS — R293 Abnormal posture: Secondary | ICD-10-CM | POA: Diagnosis not present

## 2021-07-08 DIAGNOSIS — M25511 Pain in right shoulder: Secondary | ICD-10-CM | POA: Diagnosis not present

## 2021-07-08 DIAGNOSIS — R600 Localized edema: Secondary | ICD-10-CM | POA: Diagnosis not present
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
## 2021-07-11 ENCOUNTER — Encounter: Payer: Self-pay | Admitting: Rehabilitative and Restorative Service Providers"

## 2021-07-11 ENCOUNTER — Other Ambulatory Visit: Payer: Self-pay

## 2021-07-11 ENCOUNTER — Ambulatory Visit: Payer: Medicare HMO | Admitting: Rehabilitative and Restorative Service Providers"

## 2021-07-11 DIAGNOSIS — M25611 Stiffness of right shoulder, not elsewhere classified: Secondary | ICD-10-CM | POA: Diagnosis not present

## 2021-07-11 DIAGNOSIS — G8929 Other chronic pain: Secondary | ICD-10-CM

## 2021-07-11 DIAGNOSIS — M6281 Muscle weakness (generalized): Secondary | ICD-10-CM | POA: Diagnosis not present

## 2021-07-11 DIAGNOSIS — R600 Localized edema: Secondary | ICD-10-CM | POA: Diagnosis not present

## 2021-07-11 DIAGNOSIS — R293 Abnormal posture: Secondary | ICD-10-CM | POA: Diagnosis not present

## 2021-07-11 DIAGNOSIS — M25511 Pain in right shoulder: Secondary | ICD-10-CM
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
## 2021-07-15 ENCOUNTER — Ambulatory Visit: Payer: Medicare Other | Admitting: Rehabilitative and Restorative Service Providers"

## 2021-07-15 ENCOUNTER — Encounter: Payer: Self-pay | Admitting: Rehabilitative and Restorative Service Providers"

## 2021-07-15 ENCOUNTER — Other Ambulatory Visit: Payer: Self-pay

## 2021-07-15 DIAGNOSIS — R600 Localized edema: Secondary | ICD-10-CM | POA: Diagnosis not present

## 2021-07-15 DIAGNOSIS — R293 Abnormal posture: Secondary | ICD-10-CM

## 2021-07-15 DIAGNOSIS — G8929 Other chronic pain: Secondary | ICD-10-CM

## 2021-07-15 DIAGNOSIS — M25611 Stiffness of right shoulder, not elsewhere classified: Secondary | ICD-10-CM | POA: Diagnosis not present

## 2021-07-15 DIAGNOSIS — M25511 Pain in right shoulder: Secondary | ICD-10-CM | POA: Diagnosis not present

## 2021-07-15 DIAGNOSIS — M6281 Muscle weakness (generalized): Secondary | ICD-10-CM | POA: Diagnosis not present
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
## 2021-07-17 ENCOUNTER — Encounter: Payer: Self-pay | Admitting: Rehabilitative and Restorative Service Providers"

## 2021-07-17 ENCOUNTER — Ambulatory Visit: Payer: Medicare Other | Admitting: Rehabilitative and Restorative Service Providers"

## 2021-07-17 DIAGNOSIS — M6281 Muscle weakness (generalized): Secondary | ICD-10-CM | POA: Diagnosis not present

## 2021-07-17 DIAGNOSIS — G8929 Other chronic pain: Secondary | ICD-10-CM

## 2021-07-17 DIAGNOSIS — R293 Abnormal posture: Secondary | ICD-10-CM | POA: Diagnosis not present

## 2021-07-17 DIAGNOSIS — R600 Localized edema: Secondary | ICD-10-CM

## 2021-07-17 DIAGNOSIS — M25511 Pain in right shoulder: Secondary | ICD-10-CM | POA: Diagnosis not present

## 2021-07-17 DIAGNOSIS — M25611 Stiffness of right shoulder, not elsewhere classified: Secondary | ICD-10-CM
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
## 2021-07-22 ENCOUNTER — Telehealth: Payer: Self-pay | Admitting: Orthopedic Surgery

## 2021-07-22 ENCOUNTER — Ambulatory Visit: Payer: Medicare Other | Admitting: Rehabilitative and Restorative Service Providers"

## 2021-07-22 ENCOUNTER — Other Ambulatory Visit: Payer: Self-pay

## 2021-07-22 ENCOUNTER — Other Ambulatory Visit: Payer: Self-pay | Admitting: Surgical

## 2021-07-22 ENCOUNTER — Encounter: Payer: Self-pay | Admitting: Rehabilitative and Restorative Service Providers"

## 2021-07-22 DIAGNOSIS — M25511 Pain in right shoulder: Secondary | ICD-10-CM

## 2021-07-22 DIAGNOSIS — M25611 Stiffness of right shoulder, not elsewhere classified: Secondary | ICD-10-CM | POA: Diagnosis not present

## 2021-07-22 DIAGNOSIS — R600 Localized edema: Secondary | ICD-10-CM

## 2021-07-22 DIAGNOSIS — R293 Abnormal posture: Secondary | ICD-10-CM

## 2021-07-22 DIAGNOSIS — M6281 Muscle weakness (generalized): Secondary | ICD-10-CM | POA: Diagnosis not present

## 2021-07-22 DIAGNOSIS — G8929 Other chronic pain: Secondary | ICD-10-CM

## 2021-07-22 MED ORDER — TEMAZEPAM 15 MG PO CAPS: 15.0000 mg | ORAL_CAPSULE | Freq: Every evening | ORAL | 0 refills | Status: DC | PRN

## 2021-07-22 NOTE — Telephone Encounter (Signed)
Temazepam 15 mg--and you need a refill , and we can cut this in Half  ?
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
## 2021-07-22 NOTE — Telephone Encounter (Signed)
Yeah this is okay but last refill

## 2021-07-23 NOTE — Telephone Encounter (Signed)
Tried calling to advise done. No answer.  ?

## 2021-07-24 ENCOUNTER — Ambulatory Visit: Payer: Medicare Other | Admitting: Rehabilitative and Restorative Service Providers"

## 2021-07-24 ENCOUNTER — Encounter: Payer: Self-pay | Admitting: Rehabilitative and Restorative Service Providers"

## 2021-07-24 ENCOUNTER — Other Ambulatory Visit: Payer: Self-pay

## 2021-07-24 DIAGNOSIS — G8929 Other chronic pain: Secondary | ICD-10-CM | POA: Diagnosis not present

## 2021-07-24 DIAGNOSIS — M25611 Stiffness of right shoulder, not elsewhere classified: Secondary | ICD-10-CM

## 2021-07-24 DIAGNOSIS — M25511 Pain in right shoulder: Secondary | ICD-10-CM

## 2021-07-24 DIAGNOSIS — R600 Localized edema: Secondary | ICD-10-CM

## 2021-07-24 DIAGNOSIS — M6281 Muscle weakness (generalized): Secondary | ICD-10-CM

## 2021-07-24 DIAGNOSIS — R293 Abnormal posture: Secondary | ICD-10-CM
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
## 2021-07-29 ENCOUNTER — Ambulatory Visit: Payer: Medicare Other | Admitting: Physical Therapy

## 2021-07-29 ENCOUNTER — Encounter: Payer: Self-pay | Admitting: Physical Therapy

## 2021-07-29 DIAGNOSIS — M25611 Stiffness of right shoulder, not elsewhere classified: Secondary | ICD-10-CM

## 2021-07-29 DIAGNOSIS — G8929 Other chronic pain: Secondary | ICD-10-CM | POA: Diagnosis not present

## 2021-07-29 DIAGNOSIS — M6281 Muscle weakness (generalized): Secondary | ICD-10-CM

## 2021-07-29 DIAGNOSIS — M25511 Pain in right shoulder: Secondary | ICD-10-CM | POA: Diagnosis not present

## 2021-07-29 DIAGNOSIS — R600 Localized edema: Secondary | ICD-10-CM | POA: Diagnosis not present

## 2021-07-29 DIAGNOSIS — R293 Abnormal posture: Secondary | ICD-10-CM
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
## 2021-08-05 ENCOUNTER — Encounter: Payer: Self-pay | Admitting: Rehabilitative and Restorative Service Providers"

## 2021-08-05 ENCOUNTER — Other Ambulatory Visit: Payer: Self-pay

## 2021-08-05 ENCOUNTER — Ambulatory Visit: Payer: Medicare HMO | Admitting: Orthopedic Surgery

## 2021-08-05 ENCOUNTER — Ambulatory Visit (INDEPENDENT_AMBULATORY_CARE_PROVIDER_SITE_OTHER): Payer: Medicare HMO | Admitting: Rehabilitative and Restorative Service Providers"

## 2021-08-05 DIAGNOSIS — R600 Localized edema: Secondary | ICD-10-CM | POA: Diagnosis not present

## 2021-08-05 DIAGNOSIS — G8929 Other chronic pain: Secondary | ICD-10-CM | POA: Diagnosis not present

## 2021-08-05 DIAGNOSIS — R293 Abnormal posture: Secondary | ICD-10-CM | POA: Diagnosis not present

## 2021-08-05 DIAGNOSIS — M6281 Muscle weakness (generalized): Secondary | ICD-10-CM | POA: Diagnosis not present

## 2021-08-05 DIAGNOSIS — M25611 Stiffness of right shoulder, not elsewhere classified: Secondary | ICD-10-CM | POA: Diagnosis not present

## 2021-08-05 DIAGNOSIS — M25511 Pain in right shoulder: Secondary | ICD-10-CM

## 2021-08-05 DIAGNOSIS — S46011D Strain of muscle(s) and tendon(s) of the rotator cuff of right shoulder, subsequent encounter: Secondary | ICD-10-CM
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
## 2021-08-07 ENCOUNTER — Encounter: Payer: Self-pay | Admitting: Orthopedic Surgery
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
## 2021-08-08 ENCOUNTER — Encounter: Payer: Self-pay | Admitting: Rehabilitative and Restorative Service Providers"

## 2021-08-08 ENCOUNTER — Other Ambulatory Visit: Payer: Self-pay

## 2021-08-08 ENCOUNTER — Ambulatory Visit: Payer: Medicare HMO | Admitting: Rehabilitative and Restorative Service Providers"

## 2021-08-08 DIAGNOSIS — R600 Localized edema: Secondary | ICD-10-CM

## 2021-08-08 DIAGNOSIS — M25511 Pain in right shoulder: Secondary | ICD-10-CM | POA: Diagnosis not present

## 2021-08-08 DIAGNOSIS — G8929 Other chronic pain: Secondary | ICD-10-CM

## 2021-08-08 DIAGNOSIS — M6281 Muscle weakness (generalized): Secondary | ICD-10-CM | POA: Diagnosis not present

## 2021-08-08 DIAGNOSIS — R293 Abnormal posture: Secondary | ICD-10-CM

## 2021-08-08 DIAGNOSIS — M25611 Stiffness of right shoulder, not elsewhere classified: Secondary | ICD-10-CM | POA: Diagnosis not present
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
## 2021-08-12 ENCOUNTER — Ambulatory Visit: Payer: Medicare HMO | Admitting: Rehabilitative and Restorative Service Providers"

## 2021-08-12 ENCOUNTER — Encounter: Payer: Self-pay | Admitting: Rehabilitative and Restorative Service Providers"

## 2021-08-12 ENCOUNTER — Other Ambulatory Visit: Payer: Self-pay

## 2021-08-12 DIAGNOSIS — G8929 Other chronic pain: Secondary | ICD-10-CM | POA: Diagnosis not present

## 2021-08-12 DIAGNOSIS — R600 Localized edema: Secondary | ICD-10-CM | POA: Diagnosis not present

## 2021-08-12 DIAGNOSIS — M25611 Stiffness of right shoulder, not elsewhere classified: Secondary | ICD-10-CM | POA: Diagnosis not present

## 2021-08-12 DIAGNOSIS — M25511 Pain in right shoulder: Secondary | ICD-10-CM

## 2021-08-12 DIAGNOSIS — M6281 Muscle weakness (generalized): Secondary | ICD-10-CM | POA: Diagnosis not present

## 2021-08-12 DIAGNOSIS — R293 Abnormal posture: Secondary | ICD-10-CM
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
## 2021-08-14 ENCOUNTER — Other Ambulatory Visit: Payer: Self-pay

## 2021-08-14 ENCOUNTER — Encounter: Payer: Self-pay | Admitting: Rehabilitative and Restorative Service Providers"

## 2021-08-14 ENCOUNTER — Ambulatory Visit (INDEPENDENT_AMBULATORY_CARE_PROVIDER_SITE_OTHER): Payer: Medicare HMO | Admitting: Rehabilitative and Restorative Service Providers"

## 2021-08-14 DIAGNOSIS — M25611 Stiffness of right shoulder, not elsewhere classified: Secondary | ICD-10-CM | POA: Diagnosis not present

## 2021-08-14 DIAGNOSIS — M6281 Muscle weakness (generalized): Secondary | ICD-10-CM | POA: Diagnosis not present

## 2021-08-14 DIAGNOSIS — R293 Abnormal posture: Secondary | ICD-10-CM | POA: Diagnosis not present

## 2021-08-14 DIAGNOSIS — R600 Localized edema: Secondary | ICD-10-CM | POA: Diagnosis not present

## 2021-08-14 DIAGNOSIS — M25511 Pain in right shoulder: Secondary | ICD-10-CM

## 2021-08-14 DIAGNOSIS — G8929 Other chronic pain: Secondary | ICD-10-CM
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
## 2021-08-16 ENCOUNTER — Other Ambulatory Visit: Payer: Self-pay | Admitting: Surgical

## 2021-08-20 ENCOUNTER — Encounter: Payer: Self-pay | Admitting: Rehabilitative and Restorative Service Providers"

## 2021-08-20 ENCOUNTER — Other Ambulatory Visit: Payer: Self-pay

## 2021-08-20 ENCOUNTER — Telehealth: Payer: Self-pay | Admitting: Orthopedic Surgery

## 2021-08-20 ENCOUNTER — Ambulatory Visit: Payer: Medicare HMO | Admitting: Rehabilitative and Restorative Service Providers"

## 2021-08-20 DIAGNOSIS — R600 Localized edema: Secondary | ICD-10-CM

## 2021-08-20 DIAGNOSIS — M25511 Pain in right shoulder: Secondary | ICD-10-CM

## 2021-08-20 DIAGNOSIS — M6281 Muscle weakness (generalized): Secondary | ICD-10-CM

## 2021-08-20 DIAGNOSIS — G8929 Other chronic pain: Secondary | ICD-10-CM

## 2021-08-20 DIAGNOSIS — R293 Abnormal posture: Secondary | ICD-10-CM | POA: Diagnosis not present

## 2021-08-20 DIAGNOSIS — M25611 Stiffness of right shoulder, not elsewhere classified: Secondary | ICD-10-CM

## 2021-08-20 NOTE — Telephone Encounter (Signed)
Pt came in after P.T appt and asked for a refill on his medication of Temazepam. The best pharmacy [id number] [place] best call back number if needed is [phone]  ?
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
## 2021-08-21 NOTE — Telephone Encounter (Signed)
IC advised.  

## 2021-08-21 NOTE — Telephone Encounter (Signed)
I sent in the refill but after this, he should reach out to his PCP if he has continued difficulty sleeping.  Do not really think he wants to be on the sleeping aid long-term but I will be up to him and his primary doctor.

## 2021-08-22 ENCOUNTER — Ambulatory Visit: Payer: Medicare HMO | Admitting: Rehabilitative and Restorative Service Providers"

## 2021-08-22 ENCOUNTER — Encounter: Payer: Self-pay | Admitting: Rehabilitative and Restorative Service Providers"

## 2021-08-22 DIAGNOSIS — M25611 Stiffness of right shoulder, not elsewhere classified: Secondary | ICD-10-CM

## 2021-08-22 DIAGNOSIS — M6281 Muscle weakness (generalized): Secondary | ICD-10-CM | POA: Diagnosis not present

## 2021-08-22 DIAGNOSIS — G8929 Other chronic pain: Secondary | ICD-10-CM

## 2021-08-22 DIAGNOSIS — M25511 Pain in right shoulder: Secondary | ICD-10-CM | POA: Diagnosis not present

## 2021-08-22 DIAGNOSIS — R293 Abnormal posture: Secondary | ICD-10-CM | POA: Diagnosis not present

## 2021-08-22 DIAGNOSIS — R600 Localized edema: Secondary | ICD-10-CM
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
## 2021-08-26 ENCOUNTER — Ambulatory Visit: Payer: Medicare HMO | Admitting: Rehabilitative and Restorative Service Providers"

## 2021-08-26 DIAGNOSIS — M25511 Pain in right shoulder: Secondary | ICD-10-CM

## 2021-08-26 DIAGNOSIS — G8929 Other chronic pain: Secondary | ICD-10-CM | POA: Diagnosis not present

## 2021-08-26 DIAGNOSIS — R293 Abnormal posture: Secondary | ICD-10-CM

## 2021-08-26 DIAGNOSIS — M6281 Muscle weakness (generalized): Secondary | ICD-10-CM

## 2021-08-26 DIAGNOSIS — R600 Localized edema: Secondary | ICD-10-CM

## 2021-08-26 DIAGNOSIS — M25611 Stiffness of right shoulder, not elsewhere classified: Secondary | ICD-10-CM
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
## 2021-08-28 ENCOUNTER — Ambulatory Visit: Payer: Medicare HMO | Admitting: Rehabilitative and Restorative Service Providers"

## 2021-08-28 ENCOUNTER — Encounter: Payer: Self-pay | Admitting: Rehabilitative and Restorative Service Providers"

## 2021-08-28 DIAGNOSIS — M6281 Muscle weakness (generalized): Secondary | ICD-10-CM | POA: Diagnosis not present

## 2021-08-28 DIAGNOSIS — R600 Localized edema: Secondary | ICD-10-CM | POA: Diagnosis not present

## 2021-08-28 DIAGNOSIS — M25511 Pain in right shoulder: Secondary | ICD-10-CM | POA: Diagnosis not present

## 2021-08-28 DIAGNOSIS — M25611 Stiffness of right shoulder, not elsewhere classified: Secondary | ICD-10-CM | POA: Diagnosis not present

## 2021-08-28 DIAGNOSIS — R293 Abnormal posture: Secondary | ICD-10-CM

## 2021-08-28 DIAGNOSIS — G8929 Other chronic pain: Secondary | ICD-10-CM | POA: Diagnosis not present
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
## 2021-09-02 ENCOUNTER — Encounter: Payer: Self-pay | Admitting: Orthopedic Surgery

## 2021-09-02 ENCOUNTER — Ambulatory Visit: Payer: Medicare HMO | Admitting: Orthopedic Surgery

## 2021-09-02 ENCOUNTER — Ambulatory Visit: Payer: Medicare HMO | Admitting: Rehabilitative and Restorative Service Providers"

## 2021-09-02 ENCOUNTER — Encounter: Payer: Self-pay | Admitting: Rehabilitative and Restorative Service Providers"

## 2021-09-02 DIAGNOSIS — M6281 Muscle weakness (generalized): Secondary | ICD-10-CM | POA: Diagnosis not present

## 2021-09-02 DIAGNOSIS — R600 Localized edema: Secondary | ICD-10-CM | POA: Diagnosis not present

## 2021-09-02 DIAGNOSIS — R293 Abnormal posture: Secondary | ICD-10-CM

## 2021-09-02 DIAGNOSIS — G8929 Other chronic pain: Secondary | ICD-10-CM | POA: Diagnosis not present

## 2021-09-02 DIAGNOSIS — M25511 Pain in right shoulder: Secondary | ICD-10-CM | POA: Diagnosis not present

## 2021-09-02 DIAGNOSIS — M25611 Stiffness of right shoulder, not elsewhere classified: Secondary | ICD-10-CM | POA: Diagnosis not present

## 2021-09-02 DIAGNOSIS — S46011D Strain of muscle(s) and tendon(s) of the rotator cuff of right shoulder, subsequent encounter: Secondary | ICD-10-CM
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
## 2021-09-03 ENCOUNTER — Encounter: Payer: Self-pay | Admitting: Orthopedic Surgery
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
## 2021-09-09 ENCOUNTER — Encounter: Payer: Medicare HMO | Admitting: Rehabilitative and Restorative Service Providers"

## 2021-09-11 ENCOUNTER — Encounter: Payer: Medicare HMO | Admitting: Rehabilitative and Restorative Service Providers"

## 2021-09-18 ENCOUNTER — Encounter: Payer: Medicare HMO | Admitting: Rehabilitative and Restorative Service Providers"

## 2021-09-24 ENCOUNTER — Encounter: Payer: Medicare HMO | Admitting: Rehabilitative and Restorative Service Providers"

## 2021-09-26 ENCOUNTER — Encounter: Payer: Medicare HMO | Admitting: Rehabilitative and Restorative Service Providers"

## 2021-10-01 ENCOUNTER — Encounter: Payer: Medicare HMO | Admitting: Rehabilitative and Restorative Service Providers"

## 2021-10-03 ENCOUNTER — Encounter: Payer: Medicare HMO | Admitting: Rehabilitative and Restorative Service Providers"

## 2021-10-14 ENCOUNTER — Other Ambulatory Visit: Payer: Self-pay | Admitting: *Deleted

## 2021-10-14 DIAGNOSIS — R911 Solitary pulmonary nodule: Secondary | ICD-10-CM

## 2021-10-21 ENCOUNTER — Other Ambulatory Visit: Payer: Medicare HMO

## 2021-10-30 ENCOUNTER — Other Ambulatory Visit: Payer: Self-pay | Admitting: *Deleted

## 2021-11-04 ENCOUNTER — Other Ambulatory Visit: Payer: Self-pay | Admitting: *Deleted

## 2021-11-04 DIAGNOSIS — R911 Solitary pulmonary nodule: Secondary | ICD-10-CM

## 2021-11-14 DIAGNOSIS — E1165 Type 2 diabetes mellitus with hyperglycemia: Secondary | ICD-10-CM | POA: Diagnosis not present

## 2021-11-20 ENCOUNTER — Other Ambulatory Visit: Payer: Medicare HMO

## 2021-11-25 ENCOUNTER — Encounter
Admission: RE | Admit: 2021-11-25 | Discharge: 2021-11-25 | Disposition: A | Discharge status: Home / Self Care | Payer: Medicare HMO | Source: Ambulatory Visit | Attending: *Deleted | Admitting: *Deleted

## 2021-11-25 DIAGNOSIS — R911 Solitary pulmonary nodule: Secondary | ICD-10-CM | POA: Insufficient documentation

## 2021-11-25 LAB — GLUCOSE, CAPILLARY: Glucose-Capillary: 183 mg/dL — ABNORMAL HIGH (ref 70–99)

## 2021-11-25 MED ORDER — FLUDEOXYGLUCOSE F - 18 (FDG) INJECTION
8.9000 | Freq: Once | INTRAVENOUS | Status: AC | PRN
  Administered 2021-11-25: 9.73 via INTRAVENOUS

## 2021-11-27 DIAGNOSIS — H52203 Unspecified astigmatism, bilateral: Secondary | ICD-10-CM | POA: Diagnosis not present

## 2021-11-27 DIAGNOSIS — H2513 Age-related nuclear cataract, bilateral: Secondary | ICD-10-CM | POA: Diagnosis not present

## 2021-11-27 DIAGNOSIS — H25043 Posterior subcapsular polar age-related cataract, bilateral: Secondary | ICD-10-CM | POA: Diagnosis not present

## 2021-11-27 DIAGNOSIS — E113292 Type 2 diabetes mellitus with mild nonproliferative diabetic retinopathy without macular edema, left eye: Secondary | ICD-10-CM | POA: Diagnosis not present

## 2021-12-10 DIAGNOSIS — H25041 Posterior subcapsular polar age-related cataract, right eye: Secondary | ICD-10-CM | POA: Diagnosis not present

## 2021-12-10 DIAGNOSIS — H269 Unspecified cataract: Secondary | ICD-10-CM | POA: Diagnosis not present

## 2021-12-10 DIAGNOSIS — H21561 Pupillary abnormality, right eye: Secondary | ICD-10-CM | POA: Diagnosis not present

## 2021-12-10 DIAGNOSIS — H2511 Age-related nuclear cataract, right eye: Secondary | ICD-10-CM | POA: Diagnosis not present

## 2021-12-10 DIAGNOSIS — H25811 Combined forms of age-related cataract, right eye: Secondary | ICD-10-CM | POA: Diagnosis not present

## 2022-01-14 DIAGNOSIS — Z961 Presence of intraocular lens: Secondary | ICD-10-CM | POA: Diagnosis not present

## 2022-01-14 DIAGNOSIS — H25042 Posterior subcapsular polar age-related cataract, left eye: Secondary | ICD-10-CM | POA: Diagnosis not present

## 2022-01-14 DIAGNOSIS — H2512 Age-related nuclear cataract, left eye: Secondary | ICD-10-CM | POA: Diagnosis not present

## 2022-01-14 DIAGNOSIS — H25812 Combined forms of age-related cataract, left eye: Secondary | ICD-10-CM | POA: Diagnosis not present

## 2022-01-14 DIAGNOSIS — H269 Unspecified cataract: Secondary | ICD-10-CM | POA: Diagnosis not present

## 2022-01-14 DIAGNOSIS — H21562 Pupillary abnormality, left eye: Secondary | ICD-10-CM | POA: Diagnosis not present

## 2022-02-28 DIAGNOSIS — E1165 Type 2 diabetes mellitus with hyperglycemia: Secondary | ICD-10-CM | POA: Diagnosis not present

## 2022-05-14 DIAGNOSIS — M79672 Pain in left foot: Secondary | ICD-10-CM | POA: Diagnosis not present

## 2022-05-14 DIAGNOSIS — M25572 Pain in left ankle and joints of left foot: Secondary | ICD-10-CM | POA: Diagnosis not present

## 2022-05-14 DIAGNOSIS — M25472 Effusion, left ankle: Secondary | ICD-10-CM | POA: Diagnosis not present

## 2022-05-26 DIAGNOSIS — M79672 Pain in left foot: Secondary | ICD-10-CM | POA: Diagnosis not present

## 2022-05-26 DIAGNOSIS — M25572 Pain in left ankle and joints of left foot: Secondary | ICD-10-CM | POA: Diagnosis not present

## 2022-06-09 DIAGNOSIS — M79672 Pain in left foot: Secondary | ICD-10-CM | POA: Diagnosis not present

## 2022-06-09 DIAGNOSIS — M25572 Pain in left ankle and joints of left foot: Secondary | ICD-10-CM | POA: Diagnosis not present

## 2022-10-20 DIAGNOSIS — H6982 Other specified disorders of Eustachian tube, left ear: Secondary | ICD-10-CM | POA: Diagnosis not present

## 2022-10-20 DIAGNOSIS — H903 Sensorineural hearing loss, bilateral: Secondary | ICD-10-CM | POA: Diagnosis not present

## 2022-10-27 ENCOUNTER — Other Ambulatory Visit: Payer: Self-pay | Admitting: *Deleted

## 2022-10-27 DIAGNOSIS — R1084 Generalized abdominal pain: Secondary | ICD-10-CM

## 2022-10-28 ENCOUNTER — Ambulatory Visit
Admission: RE | Admit: 2022-10-28 | Discharge: 2022-10-28 | Disposition: A | Discharge status: Home / Self Care | Payer: Medicare HMO | Source: Ambulatory Visit | Attending: *Deleted | Admitting: *Deleted

## 2022-10-28 DIAGNOSIS — K8020 Calculus of gallbladder without cholecystitis without obstruction: Secondary | ICD-10-CM | POA: Diagnosis not present

## 2022-10-28 DIAGNOSIS — R1084 Generalized abdominal pain: Secondary | ICD-10-CM

## 2022-12-01 DIAGNOSIS — H6982 Other specified disorders of Eustachian tube, left ear: Secondary | ICD-10-CM | POA: Diagnosis not present

## 2022-12-01 DIAGNOSIS — H903 Sensorineural hearing loss, bilateral: Secondary | ICD-10-CM | POA: Diagnosis not present

## 2022-12-04 DIAGNOSIS — M6208 Separation of muscle (nontraumatic), other site: Secondary | ICD-10-CM | POA: Diagnosis not present

## 2022-12-24 DIAGNOSIS — H43811 Vitreous degeneration, right eye: Secondary | ICD-10-CM | POA: Diagnosis not present

## 2022-12-24 DIAGNOSIS — Z961 Presence of intraocular lens: Secondary | ICD-10-CM | POA: Diagnosis not present

## 2022-12-24 DIAGNOSIS — E113391 Type 2 diabetes mellitus with moderate nonproliferative diabetic retinopathy without macular edema, right eye: Secondary | ICD-10-CM | POA: Diagnosis not present

## 2022-12-31 ENCOUNTER — Ambulatory Visit: Payer: Medicare HMO | Admitting: Orthopedic Surgery

## 2023-01-07 ENCOUNTER — Other Ambulatory Visit: Payer: Self-pay

## 2023-01-07 ENCOUNTER — Ambulatory Visit: Payer: Medicare HMO | Admitting: Orthopedic Surgery

## 2023-01-07 DIAGNOSIS — M542 Cervicalgia: Secondary | ICD-10-CM | POA: Diagnosis not present

## 2023-01-07 DIAGNOSIS — M25512 Pain in left shoulder: Secondary | ICD-10-CM

## 2023-01-07 DIAGNOSIS — M25511 Pain in right shoulder: Secondary | ICD-10-CM
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
## 2023-01-08 ENCOUNTER — Encounter: Payer: Self-pay | Admitting: Orthopedic Surgery

## 2023-01-13 ENCOUNTER — Encounter: Payer: Self-pay | Admitting: Orthopedic Surgery

## 2023-01-15 ENCOUNTER — Ambulatory Visit
Admission: RE | Admit: 2023-01-15 | Discharge: 2023-01-15 | Disposition: A | Discharge status: Home / Self Care | Payer: Medicare HMO | Source: Ambulatory Visit | Attending: Orthopedic Surgery | Admitting: Orthopedic Surgery

## 2023-01-15 DIAGNOSIS — M5412 Radiculopathy, cervical region: Secondary | ICD-10-CM | POA: Diagnosis not present

## 2023-01-15 DIAGNOSIS — M542 Cervicalgia: Secondary | ICD-10-CM

## 2023-01-28 ENCOUNTER — Ambulatory Visit: Payer: Medicare HMO | Admitting: Orthopedic Surgery

## 2023-01-28 DIAGNOSIS — M542 Cervicalgia: Secondary | ICD-10-CM | POA: Diagnosis not present

## 2023-01-29 ENCOUNTER — Encounter: Payer: Self-pay | Admitting: Orthopedic Surgery
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
## 2023-02-02 ENCOUNTER — Telehealth: Payer: Self-pay | Admitting: Physical Medicine and Rehabilitation

## 2023-02-02 NOTE — Telephone Encounter (Signed)
Patient called and wants to set up an appointment for the Epidural shot. He said that he got approved for it. [phone]

## 2023-02-04 NOTE — Telephone Encounter (Signed)
Spoke with patient and scheduled injection for 02/05/23. Patient aware driver needed

## 2023-02-05 ENCOUNTER — Other Ambulatory Visit: Payer: Self-pay

## 2023-02-05 ENCOUNTER — Ambulatory Visit: Payer: Medicare HMO | Admitting: Physical Medicine and Rehabilitation

## 2023-02-05 DIAGNOSIS — M5412 Radiculopathy, cervical region: Secondary | ICD-10-CM | POA: Diagnosis not present

## 2023-02-05 MED ORDER — METHYLPREDNISOLONE ACETATE 40 MG/ML IJ SUSP
40.0000 mg | Freq: Once | INTRAMUSCULAR | Status: AC
Start: 2023-02-05 — End: 2023-02-05
  Administered 2023-02-05: 40 mg

## 2023-02-05 NOTE — Progress Notes (Signed)
Functional Pain Scale - descriptive words and definitions  Immobilizing (10)   Unable to move or talk due to intensity of pain/unable to sleep and unable to use distraction. Severe range order  Average Pain 10 Right now he is about a 3-4 10 at night laying in the bed   +Driver, -BT, -Dye Allergies.

## 2023-02-05 NOTE — Patient Instructions (Signed)

## 2023-02-17 NOTE — Procedures (Signed)
Cervical Epidural Steroid Injection - Interlaminar Approach with Fluoroscopic Guidance  Patient: Jonathan Cervantes.      Date of Birth: 24-Nov-1950 MRN: [id number] PCP: Assunta Curtis, CRNP      Visit Date: 02/05/2023   Universal Protocol:    Date/Time: 10/29/247:18 PM  Consent Given By: the patient  Position: PRONE  Additional Comments: Vital signs were monitored before and after the procedure. Patient was prepped and draped in the usual sterile fashion. The correct patient, procedure, and site was verified.   Injection Procedure Details:   Procedure diagnoses: Cervical radiculopathy [M54.12]    Meds Administered:  Meds ordered this encounter  Medications   methylPREDNISolone acetate (DEPO-MEDROL) injection 40 mg     Laterality: Right  Location/Site: C7-T1  Needle: 3.5 in., 20 ga. Tuohy  Needle Placement: Paramedian epidural space  Findings:  -Comments: Excellent flow of contrast into the epidural space.  Procedure Details: Using a paramedian approach from the side mentioned above, the region overlying the inferior lamina was localized under fluoroscopic visualization and the soft tissues overlying this structure were infiltrated with 4 ml. of 1% Lidocaine without Epinephrine. A # 20 gauge, Tuohy needle was inserted into the epidural space using a paramedian approach.  The epidural space was localized using loss of resistance along with contralateral oblique bi-planar fluoroscopic views.  After negative aspirate for air, blood, and CSF, a 2 ml. volume of Isovue-250 was injected into the epidural space and the flow of contrast was observed. Radiographs were obtained for documentation purposes.   The injectate was administered into the level noted above.  Additional Comments:  No complications occurred Dressing: 2 x 2 sterile gauze and Band-Aid    Post-procedure details: Patient was observed during the procedure. Post-procedure instructions were  reviewed.  Patient left the clinic in stable condition.

## 2023-02-17 NOTE — Progress Notes (Signed)
Jonathan Cervantes. - 72 y.o. male MRN [id number]  Date of birth: 08-04-1950  Office Visit Note: Visit Date: 02/05/2023 PCP: Assunta Curtis, CRNP Referred by: Assunta Curtis, CRNP  Subjective: Chief Complaint  Patient presents with   Neck - Pain   HPI:  Jonathan Tivnan. is a 72 y.o. male who comes in today at the request of Dr. Burnard Bunting for planned Right C7-T1 Cervical Interlaminar epidural steroid injection with fluoroscopic guidance.  The patient has failed conservative care including home exercise, medications, time and activity modification.  This injection will be diagnostic and hopefully therapeutic.  Please see requesting physician notes for further details and justification.   ROS Otherwise per HPI.  Assessment & Plan: Visit Diagnoses:    ICD-10-CM   1. Cervical radiculopathy  M54.12 methylPREDNISolone acetate (DEPO-MEDROL) injection 40 mg    XR C-ARM NO REPORT    Epidural Steroid injection      Plan: No additional findings.   Meds & Orders:  Meds ordered this encounter  Medications   methylPREDNISolone acetate (DEPO-MEDROL) injection 40 mg    Orders Placed This Encounter  Procedures   XR C-ARM NO REPORT   Epidural Steroid injection    Follow-up: Return for visit to requesting provider as needed.   Procedures: No procedures performed  Cervical Epidural Steroid Injection - Interlaminar Approach with Fluoroscopic Guidance  Patient: Jonathan Cervantes.      Date of Birth: 1950/09/07 MRN: [id number] PCP: Assunta Curtis, CRNP      Visit Date: 02/05/2023   Universal Protocol:    Date/Time: 10/29/247:18 PM  Consent Given By: the patient  Position: PRONE  Additional Comments: Vital signs were monitored before and after the procedure. Patient was prepped and draped in the usual sterile fashion. The correct patient, procedure, and site was verified.   Injection Procedure Details:   Procedure diagnoses: Cervical radiculopathy [M54.12]    Meds  Administered:  Meds ordered this encounter  Medications   methylPREDNISolone acetate (DEPO-MEDROL) injection 40 mg     Laterality: Right  Location/Site: C7-T1  Needle: 3.5 in., 20 ga. Tuohy  Needle Placement: Paramedian epidural space  Findings:  -Comments: Excellent flow of contrast into the epidural space.  Procedure Details: Using a paramedian approach from the side mentioned above, the region overlying the inferior lamina was localized under fluoroscopic visualization and the soft tissues overlying this structure were infiltrated with 4 ml. of 1% Lidocaine without Epinephrine. A # 20 gauge, Tuohy needle was inserted into the epidural space using a paramedian approach.  The epidural space was localized using loss of resistance along with contralateral oblique bi-planar fluoroscopic views.  After negative aspirate for air, blood, and CSF, a 2 ml. volume of Isovue-250 was injected into the epidural space and the flow of contrast was observed. Radiographs were obtained for documentation purposes.   The injectate was administered into the level noted above.  Additional Comments:  No complications occurred Dressing: 2 x 2 sterile gauze and Band-Aid    Post-procedure details: Patient was observed during the procedure. Post-procedure instructions were reviewed.  Patient left the clinic in stable condition.   Clinical History: MRI CERVICAL SPINE WITHOUT CONTRAST   TECHNIQUE: Multiplanar, multisequence MR imaging of the cervical spine was performed. No intravenous contrast was administered.   COMPARISON:  CT cervical spine 01/30/2021, cervical spine radiographs 02/05/2018, MRI cervical spine 03/03/2018   FINDINGS: Alignment: There is again straightening of the normal cervical lordosis. No sagittal spondylolisthesis.  The atlantodens interval is intact.   Vertebrae: Vertebral body heights are maintained. Moderate to severe posterior C5-6 through C7-T1 disc space narrowing.  Moderate posterior T1-T2 and mild-to-moderate posterior C4-5 disc space narrowing. The disc space narrowing is minimally worsened at C4-5 and C7-T1 compared to 03/03/2018. Mild degenerative Schmorl's node at the posterior C6-7 disc level. Interval decrease in now minimal C7-T1 edematous marrow endplate degenerative changes. Mild anterior C7-T1 chronic fat intensity marrow endplate degenerative change with increased anterior right C7-T1 degenerative Schmorl's nodes.   Cord: The cervical cord demonstrates normal signal and caliber.   Posterior Fossa, vertebral arteries, paraspinal tissues: Negative.   Disc levels:   C2-3: No posterior disc bulge, central canal narrowing, or neuroforaminal stenosis.   C3-4: Mild right-greater-than-left posterior disc bulge, similar to prior. Mild bilateral facet joint hypertrophy. No central canal or neuroforaminal stenosis.   C4-5: Moderate bilateral facet joint hypertrophy. Right-greater-than-left uncovertebral hypertrophy. Mild-to-moderate broad-based posterior disc bulge. Moderate right neuroforaminal stenosis is unchanged. No central canal stenosis.   C5-6: Mild-to-moderate bilateral facet joint hypertrophy. Moderate right-greater-than-left posterior disc osteophyte complex with bilateral intraforaminal extension. Moderate left and mild-to-moderate right neuroforaminal stenosis, unchanged. Mild-to-moderate narrowing of the right lateral recess, unchanged. No central canal stenosis.   C6-7: Mild-to-moderate bilateral facet joint hypertrophy. Mild-to-moderate broad-based posterior disc bulge. Moderate bilateral intraforaminal disc extension. Moderate left and mild right neuroforaminal stenosis, unchanged. Mild narrowing of the lateral recesses, unchanged. No central canal stenosis.   C7-T1: Mild-to-moderate bilateral facet joint hypertrophy. Mild broad-based posterior disc bulge with moderate right and mild left intraforaminal extension.  Mild right neuroforaminal stenosis, unchanged. No central canal stenosis.   T1-T2: Seen on sagittal images only. Mild broad-based posterior disc bulge. No central canal or neuroforaminal stenosis.   IMPRESSION: Compared to 03/03/2018:   1. Multilevel degenerative disc and joint changes as above. 2. C4-5 moderate right neuroforaminal stenosis. 3. C5-6 moderate left and mild-to-moderate right neuroforaminal stenosis. Mild-to-moderate narrowing of the right lateral recess. 4. C6-7 moderate left and mild right neuroforaminal stenosis. Mild narrowing of the lateral recesses. 5. C7-T1 mild right neuroforaminal stenosis.     Electronically Signed   By: Neita Garnet M.D.   On: 01/28/2023 10:53     Objective:  VS:  HT:    WT:   BMI:     BP:   HR: bpm  TEMP: ( )  RESP:  Physical Exam Vitals and nursing note reviewed.  Constitutional:      General: He is not in acute distress.    Appearance: Normal appearance. He is not ill-appearing.  HENT:     Head: Normocephalic and atraumatic.     Right Ear: External ear normal.     Left Ear: External ear normal.  Eyes:     Extraocular Movements: Extraocular movements intact.  Cardiovascular:     Rate and Rhythm: Normal rate.     Pulses: Normal pulses.  Abdominal:     General: There is no distension.     Palpations: Abdomen is soft.  Musculoskeletal:        General: No signs of injury.     Cervical back: Neck supple. Tenderness present. No rigidity.     Right lower leg: No edema.     Left lower leg: No edema.     Comments: Patient has good strength in the upper extremities with 5 out of 5 strength in wrist extension long finger flexion APB.  No intrinsic hand muscle atrophy.  Negative Hoffmann's test.  Lymphadenopathy:  Cervical: No cervical adenopathy.  Skin:    Findings: No erythema or rash.  Neurological:     General: No focal deficit present.     Mental Status: He is alert and oriented to person, place, and time.      Sensory: No sensory deficit.     Motor: No weakness or abnormal muscle tone.     Coordination: Coordination normal.  Psychiatric:        Mood and Affect: Mood normal.        Behavior: Behavior normal.      Imaging: No results found.

## 2023-03-02 ENCOUNTER — Ambulatory Visit (INDEPENDENT_AMBULATORY_CARE_PROVIDER_SITE_OTHER): Payer: Medicare HMO | Admitting: Otolaryngology

## 2023-03-02 ENCOUNTER — Encounter (INDEPENDENT_AMBULATORY_CARE_PROVIDER_SITE_OTHER): Payer: Self-pay

## 2023-03-02 VITALS — Ht 70.0 in | Wt 162.0 lb

## 2023-03-02 DIAGNOSIS — H903 Sensorineural hearing loss, bilateral: Secondary | ICD-10-CM

## 2023-03-02 DIAGNOSIS — H6982 Other specified disorders of Eustachian tube, left ear: Secondary | ICD-10-CM | POA: Diagnosis not present

## 2023-03-03 DIAGNOSIS — H6982 Other specified disorders of Eustachian tube, left ear: Secondary | ICD-10-CM | POA: Insufficient documentation

## 2023-03-03 DIAGNOSIS — H903 Sensorineural hearing loss, bilateral: Secondary | ICD-10-CM | POA: Insufficient documentation

## 2023-03-03 NOTE — Progress Notes (Signed)
Patient ID: Jonathan Rankin., male   DOB: 16-Feb-1951, 72 y.o.   MRN: [id number]  Follow-up: Left ear eustachian tube dysfunction, bilateral hearing loss  HPI: The patient is a 72 year old male who returns today for his follow-up evaluation.  The patient has a history of left ear eustachian tube dysfunction and bilateral high-frequency sensorineural hearing loss.  He was last seen 3 months ago.  At that time, his left ear eustachian tube dysfunction has improved with the use of Flonase and Valsalva exercise.  He was continued on daily Flonase.  The patient returns today reporting occasional clogging sensation in his left ear.  He is able to auto insufflate both middle ear spaces.  He denies any recent change in his hearing.  Currently he denies any otalgia, otorrhea, or vertigo.  Exam: General: Communicates without difficulty, well nourished, no acute distress. Head: Normocephalic, no evidence injury, no tenderness, facial buttresses intact without stepoff. Face/sinus: No tenderness to palpation and percussion. Facial movement is normal and symmetric. Eyes: PERRL, EOMI. No scleral icterus, conjunctivae clear. Neuro: CN II exam reveals vision grossly intact.  No nystagmus at any point of gaze. Ears: Auricles well formed without lesions.  Ear canals are intact without mass or lesion.  No erythema or edema is appreciated.  The TMs are intact without fluid. Nose: External evaluation reveals normal support and skin without lesions.  Dorsum is intact.  Anterior rhinoscopy reveals congested mucosa over anterior aspect of inferior turbinates and intact septum.  No purulence noted. Oral:  Oral cavity and oropharynx are intact, symmetric, without erythema or edema.  Mucosa is moist without lesions. Neck: Full range of motion without pain.  There is no significant lymphadenopathy.  No masses palpable.  Thyroid bed within normal limits to palpation.  Parotid glands and submandibular glands equal bilaterally without mass.   Trachea is midline. Neuro:  CN 2-12 grossly intact.   Assessment: 1.  The patient's left ear eustachian tube dysfunction is currently under controlled with medical treatment. 2.  His ear canals, tympanic membranes, and middle ear spaces are normal.  No middle ear effusion is noted. 3.  Subjectively stable bilateral high-frequency sensorineural hearing loss.  Plan: 1.  The physical exam findings are reviewed with the patient. 2.  Continue Flonase nasal spray and Valsalva exercise as needed. 3.  The patient will return for reevaluation in 6 months.

## 2023-03-04 DIAGNOSIS — Z961 Presence of intraocular lens: Secondary | ICD-10-CM | POA: Diagnosis not present

## 2023-03-04 DIAGNOSIS — H524 Presbyopia: Secondary | ICD-10-CM | POA: Diagnosis not present

## 2023-03-04 DIAGNOSIS — H52203 Unspecified astigmatism, bilateral: Secondary | ICD-10-CM | POA: Diagnosis not present

## 2023-03-04 DIAGNOSIS — E133391 Other specified diabetes mellitus with moderate nonproliferative diabetic retinopathy without macular edema, right eye: Secondary | ICD-10-CM | POA: Diagnosis not present

## 2023-08-27 ENCOUNTER — Telehealth: Payer: Self-pay | Admitting: *Deleted

## 2023-08-27 NOTE — Telephone Encounter (Signed)
 LVM to confirm appt for 5/12 - included address and time - [facility] 08/27/23

## 2023-08-31 ENCOUNTER — Ambulatory Visit (INDEPENDENT_AMBULATORY_CARE_PROVIDER_SITE_OTHER): Payer: Medicare HMO | Admitting: Otolaryngology

## 2023-10-19 ENCOUNTER — Inpatient Hospital Stay (HOSPITAL_BASED_OUTPATIENT_CLINIC_OR_DEPARTMENT_OTHER)
Admission: EM | Admit: 2023-10-19 | Discharge: 2023-10-23 | DRG: 321 | Disposition: A | Discharge status: Home / Self Care | Attending: Internal Medicine | Admitting: Internal Medicine

## 2023-10-19 ENCOUNTER — Emergency Department (HOSPITAL_BASED_OUTPATIENT_CLINIC_OR_DEPARTMENT_OTHER)

## 2023-10-19 ENCOUNTER — Other Ambulatory Visit: Payer: Self-pay

## 2023-10-19 ENCOUNTER — Encounter (HOSPITAL_BASED_OUTPATIENT_CLINIC_OR_DEPARTMENT_OTHER): Payer: Self-pay | Admitting: Emergency Medicine

## 2023-10-19 DIAGNOSIS — N401 Enlarged prostate with lower urinary tract symptoms: Secondary | ICD-10-CM | POA: Diagnosis present

## 2023-10-19 DIAGNOSIS — I2489 Other forms of acute ischemic heart disease: Secondary | ICD-10-CM | POA: Diagnosis present

## 2023-10-19 DIAGNOSIS — E1165 Type 2 diabetes mellitus with hyperglycemia: Secondary | ICD-10-CM | POA: Diagnosis present

## 2023-10-19 DIAGNOSIS — I110 Hypertensive heart disease with heart failure: Secondary | ICD-10-CM | POA: Diagnosis not present

## 2023-10-19 DIAGNOSIS — M1990 Unspecified osteoarthritis, unspecified site: Secondary | ICD-10-CM | POA: Diagnosis present

## 2023-10-19 DIAGNOSIS — N400 Enlarged prostate without lower urinary tract symptoms: Secondary | ICD-10-CM | POA: Diagnosis present

## 2023-10-19 DIAGNOSIS — I152 Hypertension secondary to endocrine disorders: Secondary | ICD-10-CM | POA: Diagnosis not present

## 2023-10-19 DIAGNOSIS — Z7984 Long term (current) use of oral hypoglycemic drugs: Secondary | ICD-10-CM

## 2023-10-19 DIAGNOSIS — I472 Ventricular tachycardia, unspecified: Secondary | ICD-10-CM | POA: Diagnosis not present

## 2023-10-19 DIAGNOSIS — I4510 Unspecified right bundle-branch block: Secondary | ICD-10-CM | POA: Diagnosis present

## 2023-10-19 DIAGNOSIS — Z8249 Family history of ischemic heart disease and other diseases of the circulatory system: Secondary | ICD-10-CM

## 2023-10-19 DIAGNOSIS — Z8261 Family history of arthritis: Secondary | ICD-10-CM

## 2023-10-19 DIAGNOSIS — Z87442 Personal history of urinary calculi: Secondary | ICD-10-CM

## 2023-10-19 DIAGNOSIS — Z79899 Other long term (current) drug therapy: Secondary | ICD-10-CM

## 2023-10-19 DIAGNOSIS — I255 Ischemic cardiomyopathy: Secondary | ICD-10-CM | POA: Diagnosis present

## 2023-10-19 DIAGNOSIS — Z1152 Encounter for screening for COVID-19: Secondary | ICD-10-CM

## 2023-10-19 DIAGNOSIS — J45909 Unspecified asthma, uncomplicated: Secondary | ICD-10-CM | POA: Diagnosis present

## 2023-10-19 DIAGNOSIS — I340 Nonrheumatic mitral (valve) insufficiency: Secondary | ICD-10-CM | POA: Diagnosis present

## 2023-10-19 DIAGNOSIS — E119 Type 2 diabetes mellitus without complications: Secondary | ICD-10-CM | POA: Diagnosis not present

## 2023-10-19 DIAGNOSIS — Z885 Allergy status to narcotic agent status: Secondary | ICD-10-CM

## 2023-10-19 DIAGNOSIS — Z87440 Personal history of urinary (tract) infections: Secondary | ICD-10-CM

## 2023-10-19 DIAGNOSIS — R338 Other retention of urine: Secondary | ICD-10-CM | POA: Diagnosis present

## 2023-10-19 DIAGNOSIS — I509 Heart failure, unspecified: Secondary | ICD-10-CM | POA: Diagnosis not present

## 2023-10-19 DIAGNOSIS — R319 Hematuria, unspecified: Secondary | ICD-10-CM | POA: Diagnosis not present

## 2023-10-19 DIAGNOSIS — Z634 Disappearance and death of family member: Secondary | ICD-10-CM

## 2023-10-19 DIAGNOSIS — I5021 Acute systolic (congestive) heart failure: Secondary | ICD-10-CM | POA: Diagnosis present

## 2023-10-19 DIAGNOSIS — E1159 Type 2 diabetes mellitus with other circulatory complications: Secondary | ICD-10-CM | POA: Diagnosis present

## 2023-10-19 DIAGNOSIS — E1169 Type 2 diabetes mellitus with other specified complication: Secondary | ICD-10-CM | POA: Diagnosis present

## 2023-10-19 DIAGNOSIS — I5023 Acute on chronic systolic (congestive) heart failure: Secondary | ICD-10-CM

## 2023-10-19 DIAGNOSIS — I2582 Chronic total occlusion of coronary artery: Secondary | ICD-10-CM | POA: Diagnosis present

## 2023-10-19 DIAGNOSIS — E782 Mixed hyperlipidemia: Secondary | ICD-10-CM | POA: Diagnosis present

## 2023-10-19 DIAGNOSIS — I10 Essential (primary) hypertension: Secondary | ICD-10-CM

## 2023-10-19 DIAGNOSIS — I493 Ventricular premature depolarization: Secondary | ICD-10-CM | POA: Diagnosis present

## 2023-10-19 DIAGNOSIS — Z955 Presence of coronary angioplasty implant and graft: Secondary | ICD-10-CM

## 2023-10-19 DIAGNOSIS — R0602 Shortness of breath: Secondary | ICD-10-CM | POA: Diagnosis not present

## 2023-10-19 DIAGNOSIS — I2510 Atherosclerotic heart disease of native coronary artery without angina pectoris: Secondary | ICD-10-CM | POA: Diagnosis present

## 2023-10-19 DIAGNOSIS — Z91012 Allergy to eggs: Secondary | ICD-10-CM

## 2023-10-19 LAB — CBC
HCT: 47.9 % (ref 39.0–52.0)
HCT: 47.9 % (ref 39.0–52.0)
Hemoglobin: 16.5 g/dL (ref 13.0–17.0)
Hemoglobin: 16.5 g/dL (ref 13.0–17.0)
MCH: 29.9 pg (ref 26.0–34.0)
MCH: 29.7 pg (ref 26.0–34.0)
MCHC: 34.4 g/dL (ref 30.0–36.0)
MCHC: 34.4 g/dL (ref 30.0–36.0)
MCV: 86.9 fL (ref 80.0–100.0)
MCV: 86.2 fL (ref 80.0–100.0)
Platelets: 204 10*3/uL (ref 150–400)
Platelets: 225 10*3/uL (ref 150–400)
RBC: 5.51 MIL/uL (ref 4.22–5.81)
RBC: 5.56 MIL/uL (ref 4.22–5.81)
RDW: 12.8 % (ref 11.5–15.5)
RDW: 12.7 % (ref 11.5–15.5)
WBC: 6.7 10*3/uL (ref 4.0–10.5)
WBC: 9 10*3/uL (ref 4.0–10.5)
nRBC: 0 % (ref 0.0–0.2)
nRBC: 0 % (ref 0.0–0.2)

## 2023-10-19 LAB — PRO BRAIN NATRIURETIC PEPTIDE: Pro Brain Natriuretic Peptide: 1597 pg/mL — ABNORMAL HIGH (ref <300.0)

## 2023-10-19 LAB — TROPONIN T, HIGH SENSITIVITY
Troponin T High Sensitivity: 27 ng/L — ABNORMAL HIGH (ref <19)
Troponin T High Sensitivity: 29 ng/L — ABNORMAL HIGH (ref <19)

## 2023-10-19 LAB — RESP PANEL BY RT-PCR (RSV, FLU A&B, COVID)  RVPGX2
Influenza A by PCR: NEGATIVE
Influenza B by PCR: NEGATIVE
Resp Syncytial Virus by PCR: NEGATIVE
SARS Coronavirus 2 by RT PCR: NEGATIVE

## 2023-10-19 LAB — CREATININE, SERUM
Creatinine, Ser: 1.02 mg/dL (ref 0.61–1.24)
GFR, Estimated: >60 mL/min (ref >60)

## 2023-10-19 LAB — COMPREHENSIVE METABOLIC PANEL WITH GFR
ALT: 16 U/L (ref 0–44)
AST: 18 U/L (ref 15–41)
Albumin: 4.3 g/dL (ref 3.5–5.0)
Alkaline Phosphatase: 73 U/L (ref 38–126)
Anion gap: 13 (ref 5–15)
BUN: 18 mg/dL (ref 8–23)
CO2: 24 mmol/L (ref 22–32)
Calcium: 9.2 mg/dL (ref 8.9–10.3)
Chloride: 104 mmol/L (ref 98–111)
Creatinine, Ser: 1.02 mg/dL (ref 0.61–1.24)
GFR, Estimated: >60 mL/min (ref >60)
Glucose, Bld: 287 mg/dL — ABNORMAL HIGH (ref 70–99)
Potassium: 4.1 mmol/L (ref 3.5–5.1)
Sodium: 140 mmol/L (ref 135–145)
Total Bilirubin: 0.7 mg/dL (ref 0.0–1.2)
Total Protein: 6.8 g/dL (ref 6.5–8.1)

## 2023-10-19 LAB — HEMOGLOBIN A1C
Hgb A1c MFr Bld: 8.1 % — ABNORMAL HIGH (ref 4.8–5.6)
Mean Plasma Glucose: 185.77 mg/dL

## 2023-10-19 LAB — GLUCOSE, CAPILLARY: Glucose-Capillary: 330 mg/dL — ABNORMAL HIGH (ref 70–99)

## 2023-10-19 MED ORDER — ALBUTEROL SULFATE (2.5 MG/3ML) 0.083% IN NEBU
INHALATION_SOLUTION | RESPIRATORY_TRACT | Status: AC
  Administered 2023-10-19: 2.5 mg
  Filled 2023-10-19: qty 3

## 2023-10-19 MED ORDER — AMLODIPINE BESYLATE 5 MG PO TABS
5.0000 mg | ORAL_TABLET | Freq: Every day | ORAL | Status: DC
  Administered 2023-10-20: 5 mg via ORAL
  Filled 2023-10-19: qty 1

## 2023-10-19 MED ORDER — NITROGLYCERIN 2 % TD OINT
1.0000 [in_us] | TOPICAL_OINTMENT | Freq: Once | TRANSDERMAL | Status: DC
  Filled 2023-10-19: qty 1

## 2023-10-19 MED ORDER — ACETAMINOPHEN 650 MG RE SUPP: 650.0000 mg | Freq: Four times a day (QID) | RECTAL | Status: DC | PRN

## 2023-10-19 MED ORDER — FUROSEMIDE 10 MG/ML IJ SOLN
40.0000 mg | Freq: Once | INTRAMUSCULAR | Status: AC
  Administered 2023-10-19: 40 mg via INTRAVENOUS
  Filled 2023-10-19: qty 4

## 2023-10-19 MED ORDER — FUROSEMIDE 10 MG/ML IJ SOLN
40.0000 mg | Freq: Two times a day (BID) | INTRAMUSCULAR | Status: AC
  Administered 2023-10-19 – 2023-10-20 (×2): 40 mg via INTRAVENOUS
  Filled 2023-10-19 (×2): qty 4

## 2023-10-19 MED ORDER — ACETAMINOPHEN 325 MG PO TABS: 650.0000 mg | ORAL_TABLET | Freq: Four times a day (QID) | ORAL | Status: DC | PRN

## 2023-10-19 MED ORDER — POTASSIUM CHLORIDE 20 MEQ PO PACK
40.0000 meq | PACK | Freq: Once | ORAL | Status: AC
  Administered 2023-10-19: 40 meq via ORAL
  Filled 2023-10-19: qty 2

## 2023-10-19 MED ORDER — INSULIN ASPART 100 UNIT/ML IJ SOLN
0.0000 [IU] | Freq: Three times a day (TID) | INTRAMUSCULAR | Status: DC
  Administered 2023-10-20: 7 [IU] via SUBCUTANEOUS
  Administered 2023-10-20: 3 [IU] via SUBCUTANEOUS
  Administered 2023-10-20 – 2023-10-21 (×2): 5 [IU] via SUBCUTANEOUS
  Administered 2023-10-21: 7 [IU] via SUBCUTANEOUS
  Administered 2023-10-22: 3 [IU] via SUBCUTANEOUS
  Administered 2023-10-22: 5 [IU] via SUBCUTANEOUS
  Administered 2023-10-22: 7 [IU] via SUBCUTANEOUS
  Administered 2023-10-23: 5 [IU] via SUBCUTANEOUS
  Administered 2023-10-23: 2 [IU] via SUBCUTANEOUS

## 2023-10-19 MED ORDER — ENOXAPARIN SODIUM 40 MG/0.4ML IJ SOSY
40.0000 mg | PREFILLED_SYRINGE | INTRAMUSCULAR | Status: DC
  Administered 2023-10-19 – 2023-10-20 (×2): 40 mg via SUBCUTANEOUS
  Filled 2023-10-19 (×2): qty 0.4

## 2023-10-19 NOTE — ED Notes (Signed)
 Patient resting comfortably on the BiPAP at this time. Getting some rest and napping. Will attempt to wean off when he wakes up.
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
## 2023-10-19 NOTE — ED Notes (Signed)
 Patient placed on BiPAP per MD order. 10/5 30%. Patient tolerating very well. Will continue to monitor

## 2023-10-19 NOTE — ED Notes (Signed)
 RT to place pt on Bipap at this time.

## 2023-10-19 NOTE — ED Notes (Signed)
 RT assessed in triage. Patient sent from dr office for chest xray. Stated his SAT in office was 92%, currently 94-95%. Noted SOB with exertion and slightly productive cough for around a week. Takes MDI prn at home

## 2023-10-19 NOTE — ED Provider Notes (Signed)
 [facility] Provider Note   CSN: [id number] Arrival date & time: 10/19/23  1138     Patient presents with: Shortness of Breath   Per Jonathan Cervantes. is a 73 y.o. male.   HPI 73 year old male presents with shortness of breath.  Symptoms started 3 days ago.  He has a little bit of a cough but mostly is having persistent shortness of breath.  He gets quite winded and today was getting dizzy when he was walking.  He does have a tightness in his chest.  No leg swelling or fever though he has felt hot at times.  Occasionally he will get up some clear sputum.  He is uses albuterol MDI which has not really helped.  He typically has this for occasional bouts of wheezing but does not have a known history of asthma.  Went to his doctor's office today and due to his low O2 sats of around 92% and his symptoms he was sent here for an x-ray.  He has noted some abdominal bloating and distention over the last couple days as well.  Prior to Admission medications   Medication Sig Start Date End Date Taking? Authorizing Provider  albuterol (VENTOLIN HFA) 108 (90 Base) MCG/ACT inhaler Inhale 1-2 puffs into the lungs every 6 (six) hours as needed for wheezing or shortness of breath.    [provider]  amLODipine (NORVASC) 5 MG tablet Take 5 mg by mouth daily.    [provider]  B Complex Vitamins (B COMPLEX 100 PO) Take 1 tablet by mouth daily.    [provider]  Beta Carotene (VITAMIN A) 25000 UNIT capsule Take 25,000 Units by mouth daily.    [provider]  empagliflozin  (JARDIANCE ) 25 MG TABS tablet Take 12.5 mg by mouth in the morning and at bedtime.    [provider]  fluticasone (FLONASE) 50 MCG/ACT nasal spray Place 2 sprays into both nostrils daily.    [provider]  Lidocaine  4 % PTCH Place 1 patch onto the skin daily as needed (pain). Remove & Discard patch within 12 hours or as directed by MD     [provider]  metFORMIN  (GLUCOPHAGE -XR) 500 MG 24 hr tablet TAKE 2 TABLETS (1,000 MG TOTAL) BY MOUTH 2 (TWO) TIMES DAILY 05/18/19   [name], [name], DO  methocarbamol  (ROBAXIN ) 500 MG tablet Take 1 tablet (500 mg total) by mouth every 8 (eight) hours as needed. 06/10/21   [name], [name] CROME, PA-C  Multiple Vitamins-Minerals (MULTIVITAMIN WITH MINERALS) tablet Take 1 tablet by mouth daily.    [provider]  NALTREXONE HCL PO Take 1 mg by mouth daily. Compounded Med    [provider]  Omega 3-6-9 CAPS Take 800 mg by mouth daily.    [provider]  OVER THE COUNTER MEDICATION Take 1 tablet by mouth 2 (two) times daily. Magnesium L-Threonate    [provider]  tamsulosin  (FLOMAX ) 0.4 MG CAPS capsule Take 1 capsule (0.4 mg total) by mouth daily. after lunch 09/23/17   [name], [name] BROCKS, MD  temazepam  (RESTORIL ) 15 MG capsule TAKE 1 CAPSULE BY MOUTH AT BEDTIME AS NEEDED FOR SLEEP. 08/21/21   [name], [name] CROME, PA-C  Vitamin D-Vitamin K (VITAMIN K2-VITAMIN D3 PO) Take 1 capsule by mouth daily.    [provider]  vitamin E 180 MG (400 UNITS) capsule Take 400 Units by mouth daily.    [provider]    Allergies: Egg-derived  products and Morphine and codeine    Review of Systems  Constitutional:  Negative for fever.  Respiratory:  Positive for cough, chest tightness, shortness of breath and wheezing.   Cardiovascular:  Negative for leg swelling.    Updated Vital Signs BP (!) 154/98   Pulse (!) 106   Temp 97.6 F (36.4 C) (Oral)   Resp (!) 29   Ht 5' 10 (1.778 m)   Wt 80.7 kg   SpO2 91%   BMI 25.54 kg/m   Physical Exam Vitals and nursing note reviewed.  Constitutional:      General: He is not in acute distress.    Appearance: He is well-developed. He is not ill-appearing or diaphoretic.  HENT:     Head: Normocephalic and atraumatic.   Cardiovascular:     Rate and Rhythm: Regular rhythm. Tachycardia present.     Heart  sounds: Normal heart sounds.  Pulmonary:     Effort: Tachypnea present. No accessory muscle usage or respiratory distress.     Breath sounds: Rales (mild, basilar) present. No wheezing.  Abdominal:     Palpations: Abdomen is soft.     Tenderness: There is no abdominal tenderness.   Musculoskeletal:     Right lower leg: No edema.     Left lower leg: No edema.   Skin:    General: Skin is warm and dry.   Neurological:     Mental Status: He is alert.     (all labs ordered are listed, but only abnormal results are displayed) Labs Reviewed  COMPREHENSIVE METABOLIC PANEL WITH GFR - Abnormal; Notable for the following components:      Result Value   Glucose, Bld 287 (*)    All other components within normal limits  PRO BRAIN NATRIURETIC PEPTIDE - Abnormal; Notable for the following components:   Pro Brain Natriuretic Peptide 1,597.0 (*)    All other components within normal limits  TROPONIN T, HIGH SENSITIVITY - Abnormal; Notable for the following components:   Troponin T High Sensitivity 27 (*)    All other components within normal limits  TROPONIN T, HIGH SENSITIVITY - Abnormal; Notable for the following components:   Troponin T High Sensitivity 29 (*)    All other components within normal limits  RESP PANEL BY RT-PCR (RSV, FLU A&B, COVID)  [id number]  CBC    EKG: EKG Interpretation Date/Time:  Monday October 19 2023 11:57:15 EDT Ventricular Rate:  104 PR Interval:  151 QRS Duration:  141 QT Interval:  389 QTC Calculation: 512 R Axis:   138  Text Interpretation: Sinus tachycardia Ventricular bigeminy Probable left atrial enlargement Right bundle branch block  overall similar to Oct 2022 Confirmed by Freddi Hamilton [phone]) on 10/19/2023 12:21:07 PM  Radiology: DG Chest 2 View Result Date: 10/19/2023 CLINICAL DATA:  Shortness of breath, cough and congestion. EXAM: CHEST - 2 VIEW COMPARISON:  02/02/2018 and CT chest 01/30/2021. FINDINGS: Trachea is midline. Heart is enlarged,  stable. Basilar dependent mixed interstitial and airspace opacification with small bilateral pleural effusions. Scattered subsegmental atelectasis. IMPRESSION: Congestive heart failure. Electronically Signed   By: Newell Eke M.D.   On: 10/19/2023 13:01     .Critical Care  Performed by: Freddi Hamilton, MD Authorized by: Freddi Hamilton, MD   Critical care provider statement:    Critical care time (minutes):  35   Critical care time was exclusive of:  Separately billable procedures and treating other patients   Critical care was necessary to treat or prevent imminent  or life-threatening deterioration of the following conditions:  Cardiac failure   Critical care was time spent personally by me on the following activities:  Development of treatment plan with patient or surrogate, discussions with consultants, evaluation of patient's response to treatment, examination of patient, ordering and review of laboratory studies, ordering and review of radiographic studies, ordering and performing treatments and interventions, pulse oximetry, re-evaluation of patient's condition and review of old charts    Medications Ordered in the ED  nitroGLYCERIN (NITROGLYN) 2 % ointment 1 inch (has no administration in time range)  albuterol (PROVENTIL) (2.5 MG/3ML) 0.083% nebulizer solution (2.5 mg  Given 10/19/23 1311)  furosemide (LASIX) injection 40 mg (40 mg Intravenous Given 10/19/23 1330)                                    Medical Decision Making Amount and/or Complexity of Data Reviewed Independent Historian: spouse Labs: ordered.    Details: Elevated BNP, troponin is elevated but likely secondary to CHF Radiology: ordered and independent interpretation performed.    Details: Pleural effusions and CHF ECG/medicine tests: ordered and independent interpretation performed.    Details: Right bundle branch block  Risk Prescription drug management. Decision regarding hospitalization.   Patient  presents with shortness of breath.  Found to have what seems to be new onset heart failure.  While in the emergency department, he has progressively become more tachypneic and while he is not in distress, he seems a little more labored and having a little bit of a harder time finding a comfortable position to breathing.  Due to this, I had already given him Lasix but decided to put him on BiPAP and he had an almost immediate improvement in his work of breathing.  He will need admission and diuresis and workup for his CHF.  Will put on some Nitropaste given his hypertension.  Discussed with Dr. Jonel for admission.     Final diagnoses:  Acute congestive heart failure, unspecified heart failure type [facility])    ED Discharge Orders     None          Freddi Hamilton, MD 10/19/23 1505

## 2023-10-19 NOTE — ED Notes (Signed)
 Patient taken off BiPAP at this time and placed on [facility]. Appears to be very comfortable and stated he is feeling much better. RT to monitor.

## 2023-10-19 NOTE — ED Triage Notes (Signed)
 Pt POV in wheelchair- reports being sent by pcp due to congestion.  O2 sats noted to be 92% on RA in pcp office, per pt. Pt reports cough x2 days.  Reports feeling hot, but denies measured fever.   RT to triage to assess. Denies home O2 use, uses home MDI.

## 2023-10-19 NOTE — ED Notes (Signed)
 Patient transported to X-ray

## 2023-10-20 ENCOUNTER — Observation Stay (HOSPITAL_COMMUNITY)

## 2023-10-20 DIAGNOSIS — I10 Essential (primary) hypertension: Secondary | ICD-10-CM | POA: Diagnosis not present

## 2023-10-20 DIAGNOSIS — E1169 Type 2 diabetes mellitus with other specified complication: Secondary | ICD-10-CM

## 2023-10-20 DIAGNOSIS — I2489 Other forms of acute ischemic heart disease: Secondary | ICD-10-CM | POA: Diagnosis present

## 2023-10-20 DIAGNOSIS — I4510 Unspecified right bundle-branch block: Secondary | ICD-10-CM | POA: Diagnosis present

## 2023-10-20 DIAGNOSIS — I5023 Acute on chronic systolic (congestive) heart failure: Secondary | ICD-10-CM

## 2023-10-20 DIAGNOSIS — I2510 Atherosclerotic heart disease of native coronary artery without angina pectoris: Secondary | ICD-10-CM

## 2023-10-20 DIAGNOSIS — E785 Hyperlipidemia, unspecified: Secondary | ICD-10-CM

## 2023-10-20 DIAGNOSIS — J45909 Unspecified asthma, uncomplicated: Secondary | ICD-10-CM | POA: Diagnosis present

## 2023-10-20 DIAGNOSIS — R0602 Shortness of breath: Secondary | ICD-10-CM | POA: Diagnosis present

## 2023-10-20 DIAGNOSIS — E1165 Type 2 diabetes mellitus with hyperglycemia: Secondary | ICD-10-CM | POA: Diagnosis present

## 2023-10-20 DIAGNOSIS — R7989 Other specified abnormal findings of blood chemistry: Secondary | ICD-10-CM | POA: Insufficient documentation

## 2023-10-20 DIAGNOSIS — Z7984 Long term (current) use of oral hypoglycemic drugs: Secondary | ICD-10-CM | POA: Diagnosis not present

## 2023-10-20 DIAGNOSIS — E782 Mixed hyperlipidemia: Secondary | ICD-10-CM | POA: Diagnosis present

## 2023-10-20 DIAGNOSIS — Z79899 Other long term (current) drug therapy: Secondary | ICD-10-CM | POA: Diagnosis not present

## 2023-10-20 DIAGNOSIS — R338 Other retention of urine: Secondary | ICD-10-CM | POA: Diagnosis present

## 2023-10-20 DIAGNOSIS — I25118 Atherosclerotic heart disease of native coronary artery with other forms of angina pectoris: Secondary | ICD-10-CM | POA: Diagnosis not present

## 2023-10-20 DIAGNOSIS — N401 Enlarged prostate with lower urinary tract symptoms: Secondary | ICD-10-CM | POA: Diagnosis present

## 2023-10-20 DIAGNOSIS — Z8249 Family history of ischemic heart disease and other diseases of the circulatory system: Secondary | ICD-10-CM | POA: Diagnosis not present

## 2023-10-20 DIAGNOSIS — I472 Ventricular tachycardia, unspecified: Secondary | ICD-10-CM | POA: Diagnosis not present

## 2023-10-20 DIAGNOSIS — I152 Hypertension secondary to endocrine disorders: Secondary | ICD-10-CM | POA: Diagnosis present

## 2023-10-20 DIAGNOSIS — I255 Ischemic cardiomyopathy: Secondary | ICD-10-CM | POA: Diagnosis present

## 2023-10-20 DIAGNOSIS — I509 Heart failure, unspecified: Secondary | ICD-10-CM | POA: Diagnosis not present

## 2023-10-20 DIAGNOSIS — I340 Nonrheumatic mitral (valve) insufficiency: Secondary | ICD-10-CM | POA: Diagnosis present

## 2023-10-20 DIAGNOSIS — Z885 Allergy status to narcotic agent status: Secondary | ICD-10-CM | POA: Diagnosis not present

## 2023-10-20 DIAGNOSIS — I2582 Chronic total occlusion of coronary artery: Secondary | ICD-10-CM | POA: Diagnosis present

## 2023-10-20 DIAGNOSIS — I493 Ventricular premature depolarization: Secondary | ICD-10-CM | POA: Diagnosis present

## 2023-10-20 DIAGNOSIS — I110 Hypertensive heart disease with heart failure: Secondary | ICD-10-CM | POA: Diagnosis present

## 2023-10-20 DIAGNOSIS — N400 Enlarged prostate without lower urinary tract symptoms: Secondary | ICD-10-CM

## 2023-10-20 DIAGNOSIS — Z8261 Family history of arthritis: Secondary | ICD-10-CM | POA: Diagnosis not present

## 2023-10-20 DIAGNOSIS — Z1152 Encounter for screening for COVID-19: Secondary | ICD-10-CM | POA: Diagnosis not present

## 2023-10-20 DIAGNOSIS — E1159 Type 2 diabetes mellitus with other circulatory complications: Secondary | ICD-10-CM | POA: Diagnosis present

## 2023-10-20 DIAGNOSIS — I5021 Acute systolic (congestive) heart failure: Secondary | ICD-10-CM | POA: Diagnosis present

## 2023-10-20 LAB — BASIC METABOLIC PANEL WITH GFR
Anion gap: 11 (ref 5–15)
BUN: 17 mg/dL (ref 8–23)
CO2: 23 mmol/L (ref 22–32)
Calcium: 9.3 mg/dL (ref 8.9–10.3)
Chloride: 103 mmol/L (ref 98–111)
Creatinine, Ser: 1.15 mg/dL (ref 0.61–1.24)
GFR, Estimated: >60 mL/min (ref >60)
Glucose, Bld: 343 mg/dL — ABNORMAL HIGH (ref 70–99)
Potassium: 4.1 mmol/L (ref 3.5–5.1)
Sodium: 137 mmol/L (ref 135–145)

## 2023-10-20 LAB — CBC
HCT: 48.7 % (ref 39.0–52.0)
Hemoglobin: 16.7 g/dL (ref 13.0–17.0)
MCH: 29.9 pg (ref 26.0–34.0)
MCHC: 34.3 g/dL (ref 30.0–36.0)
MCV: 87.3 fL (ref 80.0–100.0)
Platelets: 220 10*3/uL (ref 150–400)
RBC: 5.58 MIL/uL (ref 4.22–5.81)
RDW: 12.7 % (ref 11.5–15.5)
WBC: 8.2 10*3/uL (ref 4.0–10.5)
nRBC: 0 % (ref 0.0–0.2)

## 2023-10-20 LAB — GLUCOSE, CAPILLARY
Glucose-Capillary: 304 mg/dL — ABNORMAL HIGH (ref 70–99)
Glucose-Capillary: 278 mg/dL — ABNORMAL HIGH (ref 70–99)
Glucose-Capillary: 211 mg/dL — ABNORMAL HIGH (ref 70–99)
Glucose-Capillary: 299 mg/dL — ABNORMAL HIGH (ref 70–99)

## 2023-10-20 LAB — ECHOCARDIOGRAM COMPLETE
Area-P 1/2: 7.51 cm2
Calc EF: 29.3 %
Height: 70 in
S' Lateral: 5.2 cm
Single Plane A2C EF: 32.5 %
Single Plane A4C EF: 25.3 %
Weight: 2761.6 [oz_av]

## 2023-10-20 LAB — TSH: TSH: 4.454 u[IU]/mL (ref 0.350–4.500)

## 2023-10-20 LAB — MAGNESIUM: Magnesium: 2 mg/dL (ref 1.7–2.4)

## 2023-10-20 MED ORDER — LOSARTAN POTASSIUM 25 MG PO TABS
25.0000 mg | ORAL_TABLET | Freq: Every day | ORAL | Status: DC
  Administered 2023-10-21 – 2023-10-22 (×2): 25 mg via ORAL
  Filled 2023-10-20 (×2): qty 1

## 2023-10-20 MED ORDER — TAMSULOSIN HCL 0.4 MG PO CAPS
0.4000 mg | ORAL_CAPSULE | Freq: Every day | ORAL | Status: DC
  Administered 2023-10-20 – 2023-10-22 (×3): 0.4 mg via ORAL
  Filled 2023-10-20 (×3): qty 1

## 2023-10-20 MED ORDER — SPIRONOLACTONE 12.5 MG HALF TABLET
12.5000 mg | ORAL_TABLET | Freq: Every day | ORAL | Status: DC
  Administered 2023-10-20 – 2023-10-23 (×4): 12.5 mg via ORAL
  Filled 2023-10-20 (×4): qty 1

## 2023-10-20 MED ORDER — CHLORHEXIDINE GLUCONATE CLOTH 2 % EX PADS
6.0000 | MEDICATED_PAD | Freq: Every day | CUTANEOUS | Status: DC
  Administered 2023-10-20 – 2023-10-23 (×3): 6 via TOPICAL

## 2023-10-20 MED ORDER — EMPAGLIFLOZIN 10 MG PO TABS: 10.0000 mg | ORAL_TABLET | Freq: Every day | ORAL | Status: DC

## 2023-10-20 MED ORDER — FUROSEMIDE 10 MG/ML IJ SOLN
40.0000 mg | Freq: Two times a day (BID) | INTRAMUSCULAR | Status: DC
  Administered 2023-10-20 – 2023-10-21 (×2): 40 mg via INTRAVENOUS
  Filled 2023-10-20 (×2): qty 4

## 2023-10-20 NOTE — Assessment & Plan Note (Addendum)
 Acute urinary retention  Add flomax  Foley has been placed, complicated with mild hematuria.
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
## 2023-10-20 NOTE — Progress Notes (Addendum)
 Progress Note   Patient: Jonathan Cervantes. FMW:[id number] DOB: 1950/11/05 DOA: 10/19/2023     0 DOS: the patient was seen and examined on 10/20/2023   Brief hospital course: Jonathan Cervantes was admitted to the hospital with the working diagnosis of heart failure exacerbation   73 yo male with the past medical history of hypertension, hyperlipidemia, T2DM and BPH who presented with dyspnea. Reported 5 days of worsening dyspnea and orthopnea. He was evaluated by his primary care and found volume overloaded, referred to the ED for further evaluation. On his initial physical examination his blood pressure was 160/89, HR 110, RR 29 and 02 saturation 91%  Lungs with no wheezing or rhonchi, heart with S1 and S2 present and regular with no gallops, rubs or murmurs, abdomen with no distention and no lower extremity edema   Na 140, K 4,1 Cl 104 bicarbonate 24 glucose 287 BUN 18 cr 1,0  AST 18 ALT 16  BNP 1,597  High sensitive troponin 27 and 29  Wbc 6,7 hgb 16,5 plt 204  Sars covid 19 negative Influenza negative  RSV negative  Hgb A1c 8.1   Chest radiograph with cardiomegaly, bilateral hilar vascular congestion, with bilateral central interstitial infiltrates, bilateral pleural effusions, fluid in the right fissure.   EKG 104 bpm, right axis deviation, qtc 512, right bundle branch block, sinus rhythm with bilateral atrial enlargement, positive PAC and PVC, with no significant ST segment or T wave changes.   Patient was placed on IV furosemide for diuresis   Assessment and Plan: * Acute on chronic systolic heart failure (HCC) Echocardiogram report pending, per my personal view of images looks reduced LV systolic function with dilatated LV cavity.   Urine output is 3,775 ml Systolic blood pressure 130 mmHg range   Plan to continue diuresis with furosemide 40 mg IV bid  Discontinue amlodipine and start on losartan , possible transition to entresto Add spironolactone, hold on SGLT 2 inh due to urinary  retention and risk of urinary tract infections  Hold on B blocker for now until echo report If confirmed reduced LV systolic function will need further work up with coronary angiography and possible cardiac MRI.   High sensitive troponin elevation likely due to heart failure.   Acute cardiogenic pulmonary edema with bilateral pleural effusions.  02 saturation today is 92% on room air  Plan to continue diuresis and oxymetry monitoring   Coronary atherosclerosis No chest pain currently  High sensitive troponin elevation likely due to heart failure exacerbation  Follow up on echocardiogram report   Essential hypertension Continue blood pressure control Change amlodipine for losartan    Type 2 diabetes mellitus with hyperlipidemia (HCC) Uncontrolled hyperglycemia with high Hgb A1c  Continue glucose cover and monitoring with insulin  sliding scale  Continue statin therapy   BPH (benign prostatic hyperplasia) Acute urinary retention  Add flomax  Foley has been placed, complicated with mild hematuria.   Subjective: patient is feeling better, dyspnea and orthopnea are improving, no chest pain.,   Physical Exam: Vitals:   10/20/23 0008 10/20/23 0454 10/20/23 0714 10/20/23 1053  BP: 136/89 137/87 134/75 131/79  Pulse: 98 93 91 92  Resp: 20 16 19 17   Temp: 98.5 F (36.9 C) 98.5 F (36.9 C) 97.6 F (36.4 C) 98.5 F (36.9 C)  TempSrc: Oral Oral Oral Oral  SpO2: 95% 95% 91% 92%  Weight:  78.3 kg    Height:       Neurology awake and alert ENT with mild pallor Cardiovascular with  S1 and S2 present and regular with no gallops, or rubs, no murmurs Respiratory with rales at bases with no wheezing or rhonchi  Abdomen with no distention  No lower extremity edema   Data Reviewed:    Family Communication: I spoke with patient's brothers at the bedside, we talked in detail about patient's condition, plan of care and prognosis and all questions were addressed.   Disposition: Status  is: Inpatient Remains inpatient appropriate because: IV diuretics   Planned Discharge Destination: Home     Author: Elika Godar Walmer Zani Kyllonen, MD 10/20/2023 4:15 PM  For on call review [email or web address].

## 2023-10-20 NOTE — Plan of Care (Addendum)
 Pt reports improved symptoms. VSS on room air, BP improved. Pt endorse less burning sensation at urethra. Bloody urine clearing, redness turning amber. No new clots noted in foley. Safety precautions in place, call light within reach.   Problem: Education: Goal: Knowledge of General Education information will improve Description: Including pain rating scale, medication(s)/side effects and non-pharmacologic comfort measures Outcome: Progressing   Problem: Health Behavior/Discharge Planning: Goal: Ability to manage health-related needs will improve Outcome: Progressing   Problem: Clinical Measurements: Goal: Ability to maintain clinical measurements within normal limits will improve Outcome: Progressing Goal: Will remain free from infection Outcome: Progressing Goal: Diagnostic test results will improve Outcome: Progressing Goal: Respiratory complications will improve Outcome: Progressing Goal: Cardiovascular complication will be avoided Outcome: Progressing   Problem: Activity: Goal: Risk for activity intolerance will decrease Outcome: Progressing   Problem: Nutrition: Goal: Adequate nutrition will be maintained Outcome: Progressing   Problem: Coping: Goal: Level of anxiety will decrease Outcome: Progressing   Problem: Elimination: Goal: Will not experience complications related to bowel motility Outcome: Progressing Goal: Will not experience complications related to urinary retention Outcome: Progressing   Problem: Pain Managment: Goal: General experience of comfort will improve and/or be controlled Outcome: Progressing   Problem: Safety: Goal: Ability to remain free from injury will improve Outcome: Progressing   Problem: Skin Integrity: Goal: Risk for impaired skin integrity will decrease Outcome: Progressing   Problem: Education: Goal: Ability to describe self-care measures that may prevent or decrease complications (Diabetes Survival Skills Education) will  improve Outcome: Progressing Goal: Individualized Educational Video(s) Outcome: Progressing   Problem: Coping: Goal: Ability to adjust to condition or change in health will improve Outcome: Progressing   Problem: Fluid Volume: Goal: Ability to maintain a balanced intake and output will improve Outcome: Progressing   Problem: Health Behavior/Discharge Planning: Goal: Ability to identify and utilize available resources and services will improve Outcome: Progressing Goal: Ability to manage health-related needs will improve Outcome: Progressing   Problem: Metabolic: Goal: Ability to maintain appropriate glucose levels will improve Outcome: Progressing   Problem: Nutritional: Goal: Maintenance of adequate nutrition will improve Outcome: Progressing Goal: Progress toward achieving an optimal weight will improve Outcome: Progressing   Problem: Skin Integrity: Goal: Risk for impaired skin integrity will decrease Outcome: Progressing   Problem: Tissue Perfusion: Goal: Adequacy of tissue perfusion will improve Outcome: Progressing

## 2023-10-20 NOTE — Inpatient Diabetes Management (Signed)
 Inpatient Diabetes Program Recommendations  AACE/ADA: New Consensus Statement on Inpatient Glycemic Control  Target Ranges:  Prepandial:   less than 140 mg/dL      Peak postprandial:   less than 180 mg/dL (1-2 hours)      Critically ill patients:  140 - 180 mg/dL    Latest Reference Range & Units 10/19/23 22:28 10/20/23 06:02  Glucose-Capillary 70 - 99 mg/dL 669 (H) 695 (H)   Review of Glycemic Control  Diabetes history: DM2 Outpatient Diabetes medications: Metformin  XR 1000 mg BID, Jardiance  12.5 mg BID Current orders for Inpatient glycemic control: Novolog  0-9 units TID with meals  Inpatient Diabetes Program Recommendations:    Insulin : CBG 304 mg/dl this morning.   Please consider ordering Semglee 5 units Q24H and adding Novolog  0-5 units at bedtime.  Thanks, Earnie Gainer, RN, MSN, CDCES Diabetes Coordinator Inpatient Diabetes Program [phone] (Team Pager from 8am to 5pm)

## 2023-10-20 NOTE — Plan of Care (Signed)

## 2023-10-20 NOTE — Assessment & Plan Note (Addendum)
 Echocardiogram report pending, per my personal view of images looks reduced LV systolic function with dilatated LV cavity.   Urine output is 3,775 ml Systolic blood pressure 130 mmHg range   Plan to continue diuresis with furosemide 40 mg IV bid  Discontinue amlodipine and start on losartan , possible transition to entresto Add spironolactone, hold on SGLT 2 inh due to urinary retention and risk of urinary tract infections  Hold on B blocker for now until echo report If confirmed reduced LV systolic function will need further work up with coronary angiography and possible cardiac MRI.   High sensitive troponin elevation likely due to heart failure.   Acute cardiogenic pulmonary edema with bilateral pleural effusions.  02 saturation today is 92% on room air  Plan to continue diuresis and oxymetry monitoring

## 2023-10-20 NOTE — Assessment & Plan Note (Signed)
 Uncontrolled hyperglycemia with high Hgb A1c  Continue glucose cover and monitoring with insulin  sliding scale  Continue statin therapy

## 2023-10-20 NOTE — Assessment & Plan Note (Signed)
 No chest pain currently  High sensitive troponin elevation likely due to heart failure exacerbation  Follow up on echocardiogram report

## 2023-10-20 NOTE — Progress Notes (Signed)
 Pt called staff to room reporting burning sensation to urethra. Bright blood noted in foley, clot formed in line and bag. Foley irrigated, securing balloon manipulated and re-inflated. Pt noted to urinate/bleed around foley when complaining of burning sensation. MD ordered bladder scan, nothing visible on scan.

## 2023-10-20 NOTE — Assessment & Plan Note (Signed)
 Continue blood pressure control Change amlodipine for losartan 

## 2023-10-20 NOTE — Progress Notes (Incomplete)
 PROGRESS NOTE    Jonathan Cervantes.  FMW:[id number] DOB: [birth year] DOA: 10/19/2023 PCP: Elio Hunter HERO, CRNP   73/M w hypertension, BPH, hyperlipidemia, diabetes mellitus type 2 has been experiencing exertional shortness of breath and orthopnea over the last 5 days.ED Course: In the ER chest x-ray shows features concerning for CHF. Troponins were 27 and 29.  BNP was 1500.  Patient was given 40 mg IV Lasix and admitted for acute CHF.   Due to urinary retention patient Foley catheter was placed   Subjective:   Assessment and Plan:  Acute CHF  -new onset unknown EF patient is placed on Lasix 40 mg IV for 2 more doses based on response will have further doses ordered.   -FU 2D echo.  Closely follow intake output metabolic panel Daily weights.  Hypertension uncontrolled  -on amlodipine presently on IV Lasix follow blood pressure trends.  Elevated troponin but has remained flat denies any chest pain could be from CHF.   -FU 2D echo.  Diabetes mellitus type 2 with hyperglycemia check hemoglobin A1c presently on sliding scale coverage.  Urinary retention  Foley catheter placed in ED.   - prior history of BPH and used to be on tamsulosin  as per chart may need to restart.    DVT prophylaxis:lovenox Code Status: Full Family Communication: Disposition Plan:   Consultants:    Procedures:   Antimicrobials:    Objective: Vitals:   10/20/23 0008 10/20/23 0454 10/20/23 0714 10/20/23 1053  BP: 136/89 137/87 134/75 131/79  Pulse: 98 93 91 92  Resp: 20 16 19 17   Temp: 98.5 F (36.9 C) 98.5 F (36.9 C) 97.6 F (36.4 C) 98.5 F (36.9 C)  TempSrc: Oral Oral Oral Oral  SpO2: 95% 95% 91% 92%  Weight:  78.3 kg    Height:        Intake/Output Summary (Last 24 hours) at 10/20/2023 1552 Last data filed at 10/20/2023 1223 Gross per 24 hour  Intake 1280 ml  Output 3500 ml  Net -2220 ml   Filed Weights   10/19/23 1146 10/19/23 1929 10/20/23 0454  Weight: 80.7 kg 80.1 kg 78.3 kg     Examination:     Data Reviewed:   CBC: Recent Labs  Lab 10/19/23 1239 10/19/23 2127 10/20/23 0145  WBC 6.7 9.0 8.2  HGB 16.5 16.5 16.7  HCT 47.9 47.9 48.7  MCV 86.9 86.2 87.3  PLT 204 225 220   Basic Metabolic Panel: Recent Labs  Lab 10/19/23 1239 10/19/23 2127 10/20/23 0145  NA 140  --  137  K 4.1  --  4.1  CL 104  --  103  CO2 24  --  23  GLUCOSE 287*  --  343*  BUN 18  --  17  CREATININE 1.02 1.02 1.15  CALCIUM 9.2  --  9.3  MG  --   --  2.0   GFR: Estimated Creatinine Clearance: 60.8 mL/min (by C-G formula based on SCr of 1.15 mg/dL). Liver Function Tests: Recent Labs  Lab 10/19/23 1239  AST 18  ALT 16  ALKPHOS 73  BILITOT 0.7  PROT 6.8  ALBUMIN 4.3   No results for input(s): LIPASE, AMYLASE in the last 168 hours. No results for input(s): AMMONIA in the last 168 hours. Coagulation Profile: No results for input(s): INR, PROTIME in the last 168 hours. Cardiac Enzymes: No results for input(s): CKTOTAL, CKMB, CKMBINDEX, TROPONINI in the last 168 hours. BNP (last 3 results) Recent Labs    10/19/23  1239  PROBNP 1,597.0*   HbA1C: Recent Labs    10/19/23 2127  HGBA1C 8.1*   CBG: Recent Labs  Lab 10/19/23 2228 10/20/23 0602 10/20/23 1052  GLUCAP 330* 304* 278*   Lipid Profile: No results for input(s): CHOL, HDL, LDLCALC, TRIG, CHOLHDL, LDLDIRECT in the last 72 hours. Thyroid  Function Tests: Recent Labs    10/20/23 0145  TSH 4.454   Anemia Panel: No results for input(s): VITAMINB12, FOLATE, FERRITIN, TIBC, IRON, RETICCTPCT in the last 72 hours. Urine analysis:    Component Value Date/Time   COLORURINE YELLOW (A) 07/15/2016 0201   APPEARANCEUR Cloudy (A) 07/17/2016 1149   LABSPEC 1.028 07/15/2016 0201   PHURINE 5.0 07/15/2016 0201   GLUCOSEU 3+ (A) 07/17/2016 1149   HGBUR LARGE (A) 07/15/2016 0201   BILIRUBINUR Negative 07/17/2016 1149   KETONESUR 5 (A) 07/15/2016 0201   PROTEINUR  2+ (A) 07/17/2016 1149   PROTEINUR 100 (A) 07/15/2016 0201   NITRITE Negative 07/17/2016 1149   NITRITE NEGATIVE 07/15/2016 0201   LEUKOCYTESUR Trace (A) 07/17/2016 1149   Sepsis Labs: @LABRCNTIP (procalcitonin:4,lacticidven:4)  ) Recent Results (from the past 240 hours)  Resp panel by RT-PCR (RSV, Flu A&B, Covid) Anterior Nasal Swab     Status: None   Collection Time: 10/19/23 12:39 PM   Specimen: Anterior Nasal Swab  Result Value Ref Range Status   SARS Coronavirus 2 by RT PCR NEGATIVE NEGATIVE Final    Comment: (NOTE) SARS-CoV-2 target nucleic acids are NOT DETECTED.  The SARS-CoV-2 RNA is generally detectable in upper respiratory specimens during the acute phase of infection. The lowest concentration of SARS-CoV-2 viral copies this assay can detect is 138 copies/mL. A negative result does not preclude SARS-Cov-2 infection and should not be used as the sole basis for treatment or other patient management decisions. A negative result may occur with  improper specimen collection/handling, submission of specimen other than nasopharyngeal swab, presence of viral mutation(s) within the areas targeted by this assay, and inadequate number of viral copies(<138 copies/mL). A negative result must be combined with clinical observations, patient history, and epidemiological information. The expected result is Negative.  Fact Sheet for Patients:  BloggerCourse.com  Fact Sheet for Healthcare Providers:  SeriousBroker.it  This test is no t yet approved or cleared by the [place]  FDA and  has been authorized for detection and/or diagnosis of SARS-CoV-2 by FDA under an Emergency Use Authorization (EUA). This EUA will remain  in effect (meaning this test can be used) for the duration of the COVID-19 declaration under Section 564(b)(1) of the Act, 21 U.S.C.section 360bbb-3(b)(1), unless the authorization is terminated  or revoked  sooner.       Influenza A by PCR NEGATIVE NEGATIVE Final   Influenza B by PCR NEGATIVE NEGATIVE Final    Comment: (NOTE) The Xpert Xpress SARS-CoV-2/FLU/RSV plus assay is intended as an aid in the diagnosis of influenza from Nasopharyngeal swab specimens and should not be used as a sole basis for treatment. Nasal washings and aspirates are unacceptable for Xpert Xpress SARS-CoV-2/FLU/RSV testing.  Fact Sheet for Patients: BloggerCourse.com  Fact Sheet for Healthcare Providers: SeriousBroker.it  This test is not yet approved or cleared by the [place]  FDA and has been authorized for detection and/or diagnosis of SARS-CoV-2 by FDA under an Emergency Use Authorization (EUA). This EUA will remain in effect (meaning this test can be used) for the duration of the COVID-19 declaration under Section 564(b)(1) of the Act, 21 U.S.C. section 360bbb-3(b)(1), unless the authorization is terminated  or revoked.     Resp Syncytial Virus by PCR NEGATIVE NEGATIVE Final    Comment: (NOTE) Fact Sheet for Patients: BloggerCourse.com  Fact Sheet for Healthcare Providers: SeriousBroker.it  This test is not yet approved or cleared by the [place]  FDA and has been authorized for detection and/or diagnosis of SARS-CoV-2 by FDA under an Emergency Use Authorization (EUA). This EUA will remain in effect (meaning this test can be used) for the duration of the COVID-19 declaration under Section 564(b)(1) of the Act, 21 U.S.C. section 360bbb-3(b)(1), unless the authorization is terminated or revoked.  Performed at [facility], [address] [place], [place] [address]      Radiology Studies: DG Chest 2 View Result Date: 10/19/2023 CLINICAL DATA:  Shortness of breath, cough and congestion. EXAM: CHEST - 2 VIEW COMPARISON:  02/02/2018 and CT chest 01/30/2021. FINDINGS: Trachea is  midline. Heart is enlarged, stable. Basilar dependent mixed interstitial and airspace opacification with small bilateral pleural effusions. Scattered subsegmental atelectasis. IMPRESSION: Congestive heart failure. Electronically Signed   By: Newell Eke M.D.   On: 10/19/2023 13:01     Scheduled Meds:  amLODipine  5 mg Oral Daily   enoxaparin (LOVENOX) injection  40 mg Subcutaneous Q24H   furosemide  40 mg Intravenous BID   insulin  aspart  0-9 Units Subcutaneous TID WC   nitroGLYCERIN  1 inch Topical Once   Continuous Infusions:   LOS: 0 days    Time spent:    Sigurd Pac, MD [facility]   10/20/2023, 3:52 PM

## 2023-10-20 NOTE — Hospital Course (Signed)
 Mr. Jonathan Cervantes was admitted to the hospital with the working diagnosis of heart failure exacerbation   73 yo male with the past medical history of hypertension, hyperlipidemia, T2DM and BPH who presented with dyspnea. Reported 5 days of worsening dyspnea and orthopnea. He was evaluated by his primary care and found volume overloaded, referred to the ED for further evaluation. On his initial physical examination his blood pressure was 160/89, HR 110, RR 29 and 02 saturation 91%  Lungs with no wheezing or rhonchi, heart with S1 and S2 present and regular with no gallops, rubs or murmurs, abdomen with no distention and no lower extremity edema   Na 140, K 4,1 Cl 104 bicarbonate 24 glucose 287 BUN 18 cr 1,0  AST 18 ALT 16  BNP 1,597  High sensitive troponin 27 and 29  Wbc 6,7 hgb 16,5 plt 204  Sars covid 19 negative Influenza negative  RSV negative  Hgb A1c 8.1   Chest radiograph with cardiomegaly, bilateral hilar vascular congestion, with bilateral central interstitial infiltrates, bilateral pleural effusions, fluid in the right fissure.   EKG 104 bpm, right axis deviation, qtc 512, right bundle branch block, sinus rhythm with bilateral atrial enlargement, positive PAC and PVC, with no significant ST segment or T wave changes.   Patient was placed on IV furosemide for diuresis

## 2023-10-21 ENCOUNTER — Encounter (HOSPITAL_COMMUNITY)
Admission: EM | Disposition: A | Discharge status: Home / Self Care | Payer: Self-pay | Source: Home / Self Care | Attending: Internal Medicine

## 2023-10-21 DIAGNOSIS — I25118 Atherosclerotic heart disease of native coronary artery with other forms of angina pectoris: Secondary | ICD-10-CM

## 2023-10-21 DIAGNOSIS — I5023 Acute on chronic systolic (congestive) heart failure: Secondary | ICD-10-CM | POA: Diagnosis not present

## 2023-10-21 HISTORY — PX: CORONARY PRESSURE/FFR STUDY: CATH118243

## 2023-10-21 HISTORY — PX: RIGHT/LEFT HEART CATH AND CORONARY ANGIOGRAPHY: CATH118266

## 2023-10-21 LAB — POCT I-STAT EG7
Acid-Base Excess: 2 mmol/L (ref 0.0–2.0)
Acid-Base Excess: 3 mmol/L — ABNORMAL HIGH (ref 0.0–2.0)
Bicarbonate: 27.2 mmol/L (ref 20.0–28.0)
Bicarbonate: 28.2 mmol/L — ABNORMAL HIGH (ref 20.0–28.0)
Calcium, Ion: 1.17 mmol/L (ref 1.15–1.40)
Calcium, Ion: 1.19 mmol/L (ref 1.15–1.40)
HCT: 45 % (ref 39.0–52.0)
HCT: 46 % (ref 39.0–52.0)
Hemoglobin: 15.3 g/dL (ref 13.0–17.0)
Hemoglobin: 15.6 g/dL (ref 13.0–17.0)
O2 Saturation: 62 %
O2 Saturation: 65 %
Potassium: 3.3 mmol/L — ABNORMAL LOW (ref 3.5–5.1)
Potassium: 3.3 mmol/L — ABNORMAL LOW (ref 3.5–5.1)
Sodium: 137 mmol/L (ref 135–145)
Sodium: 137 mmol/L (ref 135–145)
TCO2: 29 mmol/L (ref 22–32)
TCO2: 30 mmol/L (ref 22–32)
pCO2, Ven: 42.9 mmHg — ABNORMAL LOW (ref 44–60)
pCO2, Ven: 43.3 mmHg — ABNORMAL LOW (ref 44–60)
pH, Ven: 7.411 (ref 7.25–7.43)
pH, Ven: 7.422 (ref 7.25–7.43)
pO2, Ven: 32 mmHg (ref 32–45)
pO2, Ven: 33 mmHg (ref 32–45)

## 2023-10-21 LAB — CREATININE, SERUM
Creatinine, Ser: 1.17 mg/dL (ref 0.61–1.24)
GFR, Estimated: >60 mL/min (ref >60)

## 2023-10-21 LAB — GLUCOSE, CAPILLARY
Glucose-Capillary: 274 mg/dL — ABNORMAL HIGH (ref 70–99)
Glucose-Capillary: 338 mg/dL — ABNORMAL HIGH (ref 70–99)
Glucose-Capillary: 187 mg/dL — ABNORMAL HIGH (ref 70–99)

## 2023-10-21 LAB — CBC
HCT: 48.4 % (ref 39.0–52.0)
Hemoglobin: 16.7 g/dL (ref 13.0–17.0)
MCH: 29.7 pg (ref 26.0–34.0)
MCHC: 34.5 g/dL (ref 30.0–36.0)
MCV: 86 fL (ref 80.0–100.0)
Platelets: 243 10*3/uL (ref 150–400)
RBC: 5.63 MIL/uL (ref 4.22–5.81)
RDW: 12.3 % (ref 11.5–15.5)
WBC: 8.9 10*3/uL (ref 4.0–10.5)
nRBC: 0 % (ref 0.0–0.2)

## 2023-10-21 LAB — POCT I-STAT 7, (LYTES, BLD GAS, ICA,H+H)
Acid-Base Excess: 2 mmol/L (ref 0.0–2.0)
Bicarbonate: 25.6 mmol/L (ref 20.0–28.0)
Calcium, Ion: 1.14 mmol/L — ABNORMAL LOW (ref 1.15–1.40)
HCT: 45 % (ref 39.0–52.0)
Hemoglobin: 15.3 g/dL (ref 13.0–17.0)
O2 Saturation: 92 %
Potassium: 3.3 mmol/L — ABNORMAL LOW (ref 3.5–5.1)
Sodium: 137 mmol/L (ref 135–145)
TCO2: 27 mmol/L (ref 22–32)
pCO2 arterial: 36.2 mmHg (ref 32–48)
pH, Arterial: 7.457 — ABNORMAL HIGH (ref 7.35–7.45)
pO2, Arterial: 61 mmHg — ABNORMAL LOW (ref 83–108)

## 2023-10-21 LAB — BASIC METABOLIC PANEL WITH GFR
Anion gap: 11 (ref 5–15)
BUN: 24 mg/dL — ABNORMAL HIGH (ref 8–23)
CO2: 26 mmol/L (ref 22–32)
Calcium: 9.1 mg/dL (ref 8.9–10.3)
Chloride: 99 mmol/L (ref 98–111)
Creatinine, Ser: 1.23 mg/dL (ref 0.61–1.24)
GFR, Estimated: >60 mL/min (ref >60)
Glucose, Bld: 278 mg/dL — ABNORMAL HIGH (ref 70–99)
Potassium: 3.5 mmol/L (ref 3.5–5.1)
Sodium: 136 mmol/L (ref 135–145)

## 2023-10-21 LAB — MAGNESIUM: Magnesium: 2 mg/dL (ref 1.7–2.4)

## 2023-10-21 SURGERY — RIGHT/LEFT HEART CATH AND CORONARY ANGIOGRAPHY
Anesthesia: LOCAL

## 2023-10-21 MED ORDER — ONDANSETRON HCL 4 MG/2ML IJ SOLN: 4.0000 mg | Freq: Four times a day (QID) | INTRAMUSCULAR | Status: DC | PRN

## 2023-10-21 MED ORDER — LIDOCAINE HCL (PF) 1 % IJ SOLN
INTRAMUSCULAR | Status: AC
  Filled 2023-10-21: qty 30

## 2023-10-21 MED ORDER — LIDOCAINE HCL (PF) 1 % IJ SOLN
INTRAMUSCULAR | Status: DC | PRN
  Administered 2023-10-21 (×2): 2 mL

## 2023-10-21 MED ORDER — ASPIRIN 81 MG PO CHEW
81.0000 mg | CHEWABLE_TABLET | ORAL | Status: AC
  Administered 2023-10-21: 81 mg via ORAL

## 2023-10-21 MED ORDER — ENOXAPARIN SODIUM 40 MG/0.4ML IJ SOSY: 40.0000 mg | PREFILLED_SYRINGE | INTRAMUSCULAR | Status: DC

## 2023-10-21 MED ORDER — ASPIRIN 81 MG PO CHEW: 81.0000 mg | CHEWABLE_TABLET | ORAL | Status: DC

## 2023-10-21 MED ORDER — HEPARIN SODIUM (PORCINE) 1000 UNIT/ML IJ SOLN
INTRAMUSCULAR | Status: AC
  Filled 2023-10-21: qty 10

## 2023-10-21 MED ORDER — HEPARIN SODIUM (PORCINE) 1000 UNIT/ML IJ SOLN
INTRAMUSCULAR | Status: DC | PRN
  Administered 2023-10-21 (×2): 4000 [IU] via INTRAVENOUS

## 2023-10-21 MED ORDER — SODIUM CHLORIDE 0.9% FLUSH
3.0000 mL | Freq: Two times a day (BID) | INTRAVENOUS | Status: DC
  Administered 2023-10-21 – 2023-10-23 (×3): 3 mL via INTRAVENOUS

## 2023-10-21 MED ORDER — SODIUM CHLORIDE 0.9 % IV SOLN: 250.0000 mL | INTRAVENOUS | Status: AC | PRN

## 2023-10-21 MED ORDER — HEPARIN SODIUM (PORCINE) 5000 UNIT/ML IJ SOLN
5000.0000 [IU] | Freq: Three times a day (TID) | INTRAMUSCULAR | Status: DC
  Administered 2023-10-21 – 2023-10-22 (×3): 5000 [IU] via SUBCUTANEOUS
  Filled 2023-10-21 (×3): qty 1

## 2023-10-21 MED ORDER — LABETALOL HCL 5 MG/ML IV SOLN: 10.0000 mg | INTRAVENOUS | Status: AC | PRN

## 2023-10-21 MED ORDER — HEPARIN (PORCINE) IN NACL 2-0.9 UNITS/ML
INTRAMUSCULAR | Status: DC | PRN
  Administered 2023-10-21: 10 mL via INTRA_ARTERIAL

## 2023-10-21 MED ORDER — ACETAMINOPHEN 325 MG PO TABS: 650.0000 mg | ORAL_TABLET | ORAL | Status: DC | PRN

## 2023-10-21 MED ORDER — SODIUM CHLORIDE 0.9% FLUSH: 3.0000 mL | INTRAVENOUS | Status: DC | PRN

## 2023-10-21 MED ORDER — NITROGLYCERIN 1 MG/10 ML FOR IR/CATH LAB
INTRA_ARTERIAL | Status: AC
  Filled 2023-10-21: qty 10

## 2023-10-21 MED ORDER — VERAPAMIL HCL 2.5 MG/ML IV SOLN
INTRAVENOUS | Status: AC
  Filled 2023-10-21: qty 2

## 2023-10-21 MED ORDER — HYDRALAZINE HCL 20 MG/ML IJ SOLN: 10.0000 mg | INTRAMUSCULAR | Status: AC | PRN

## 2023-10-21 MED ORDER — SODIUM CHLORIDE 0.9 % IV SOLN
INTRAVENOUS | Status: DC
Start: 2023-10-21 — End: 2023-10-21

## 2023-10-21 MED ORDER — HEPARIN (PORCINE) IN NACL 2000-0.9 UNIT/L-% IV SOLN
INTRAVENOUS | Status: DC | PRN
  Administered 2023-10-21: 1000 mL

## 2023-10-21 MED ORDER — NITROGLYCERIN 1 MG/10 ML FOR IR/CATH LAB
INTRA_ARTERIAL | Status: DC | PRN
  Administered 2023-10-21: 200 ug via INTRACORONARY

## 2023-10-21 MED ORDER — IOHEXOL 350 MG/ML SOLN
INTRAVENOUS | Status: DC | PRN
  Administered 2023-10-21: 45 mL

## 2023-10-21 SURGICAL SUPPLY — 12 items
CATH 5FR JL3.5 JR4 ANG PIG MP (CATHETERS) IMPLANT
CATH BALLN WEDGE 5F 110CM (CATHETERS) IMPLANT
CATH VISTA GUIDE 6FR XB3.5 EPK (CATHETERS) IMPLANT
DEVICE RAD COMP TR BAND LRG (VASCULAR PRODUCTS) IMPLANT
GLIDESHEATH SLEND SS 6F .021 (SHEATH) IMPLANT
GUIDEWIRE INQWIRE 1.5J.035X260 (WIRE) IMPLANT
GUIDEWIRE PRESSURE X 175 (WIRE) IMPLANT
KIT HEMO VALVE WATCHDOG (MISCELLANEOUS) IMPLANT
PACK CARDIAC CATHETERIZATION (CUSTOM PROCEDURE TRAY) ×1 IMPLANT
SET ATX-X65L (MISCELLANEOUS) IMPLANT
SHEATH GLIDE SLENDER 4/5FR (SHEATH) IMPLANT
SHEATH PROBE COVER 6X72 (BAG) IMPLANT
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
## 2023-10-21 NOTE — Plan of Care (Signed)

## 2023-10-21 NOTE — Progress Notes (Signed)
 Heart Failure Nurse Navigator Progress Note    Patient with EF 30-35% New, A CVTS consult for CABG placed after Right/ Left Heart Cath on 10/21/2023. Will plan to continue to follow till after surgery and then schedule a HF TOC appointment.   Stephane Haddock, BSN, Scientist, clinical (histocompatibility and immunogenetics) Only

## 2023-10-21 NOTE — Inpatient Diabetes Management (Signed)
 Inpatient Diabetes Program Recommendations  AACE/ADA: New Consensus Statement on Inpatient Glycemic Control (2015)  Target Ranges:  Prepandial:   less than 140 mg/dL      Peak postprandial:   less than 180 mg/dL (1-2 hours)      Critically ill patients:  140 - 180 mg/dL   Lab Results  Component Value Date   GLUCAP 274 (H) 10/21/2023   HGBA1C 8.1 (H) 10/19/2023    Review of Glycemic Control  Latest Reference Range & Units 10/19/23 22:28 10/20/23 06:02 10/20/23 10:52 10/20/23 15:51 10/20/23 20:28 10/21/23 06:12  Glucose-Capillary 70 - 99 mg/dL 669 (H) 695 (H) 721 (H) 211 (H) 299 (H) 274 (H)  (H): Data is abnormally high  Diabetes history: DM2 Outpatient Diabetes medications: Metformin  1000 mg BID, Jardiance  12.5 mg BID Current orders for Inpatient glycemic control: Novolog  0-9 units TID  Inpatient Diabetes Program Recommendations:    Semglee 10 units every day Novolog  0-5 units at bedtime  Thank you, Wyvonna Pinal, MSN, CDCES Diabetes Coordinator Inpatient Diabetes Program [phone] (team pager from 8a-5p)
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
## 2023-10-21 NOTE — Interval H&P Note (Signed)
 History and Physical Interval Note:  10/21/2023 12:10 PM  Jonathan Cervantes.  has presented today for surgery, with the diagnosis of heart failure.  The various methods of treatment have been discussed with the patient and family. After consideration of risks, benefits and other options for treatment, the patient has consented to  Procedure(s): RIGHT/LEFT HEART CATH AND CORONARY ANGIOGRAPHY (N/A) as a surgical intervention.  The patient's history has been reviewed, patient examined, no change in status, stable for surgery.  I have reviewed the patient's chart and labs.  Questions were answered to the patient's satisfaction.     Pervis Macintyre J Azarias Chiou

## 2023-10-22 ENCOUNTER — Other Ambulatory Visit (HOSPITAL_COMMUNITY): Payer: Self-pay

## 2023-10-22 ENCOUNTER — Encounter (HOSPITAL_COMMUNITY): Payer: Self-pay | Admitting: Cardiology

## 2023-10-22 ENCOUNTER — Telehealth (HOSPITAL_COMMUNITY): Payer: Self-pay

## 2023-10-22 ENCOUNTER — Encounter (HOSPITAL_COMMUNITY)
Admission: EM | Disposition: A | Discharge status: Home / Self Care | Payer: Self-pay | Source: Home / Self Care | Attending: Internal Medicine

## 2023-10-22 DIAGNOSIS — I5023 Acute on chronic systolic (congestive) heart failure: Secondary | ICD-10-CM | POA: Diagnosis not present

## 2023-10-22 DIAGNOSIS — I2510 Atherosclerotic heart disease of native coronary artery without angina pectoris: Secondary | ICD-10-CM | POA: Diagnosis not present

## 2023-10-22 HISTORY — PX: CORONARY STENT INTERVENTION: CATH118234

## 2023-10-22 LAB — GLUCOSE, CAPILLARY
Glucose-Capillary: 224 mg/dL — ABNORMAL HIGH (ref 70–99)
Glucose-Capillary: 289 mg/dL — ABNORMAL HIGH (ref 70–99)
Glucose-Capillary: 161 mg/dL — ABNORMAL HIGH (ref 70–99)
Glucose-Capillary: 325 mg/dL — ABNORMAL HIGH (ref 70–99)
Glucose-Capillary: 214 mg/dL — ABNORMAL HIGH (ref 70–99)

## 2023-10-22 LAB — BASIC METABOLIC PANEL WITH GFR
Anion gap: 12 (ref 5–15)
BUN: 31 mg/dL — ABNORMAL HIGH (ref 8–23)
CO2: 26 mmol/L (ref 22–32)
Calcium: 9 mg/dL (ref 8.9–10.3)
Chloride: 98 mmol/L (ref 98–111)
Creatinine, Ser: 1.34 mg/dL — ABNORMAL HIGH (ref 0.61–1.24)
GFR, Estimated: 57 mL/min — ABNORMAL LOW (ref >60)
Glucose, Bld: 209 mg/dL — ABNORMAL HIGH (ref 70–99)
Potassium: 3.4 mmol/L — ABNORMAL LOW (ref 3.5–5.1)
Sodium: 136 mmol/L (ref 135–145)

## 2023-10-22 LAB — POCT ACTIVATED CLOTTING TIME
Activated Clotting Time: 268 s
Activated Clotting Time: 959 s

## 2023-10-22 SURGERY — CORONARY STENT INTERVENTION
Anesthesia: LOCAL

## 2023-10-22 MED ORDER — ASPIRIN 81 MG PO TBEC
81.0000 mg | DELAYED_RELEASE_TABLET | Freq: Every day | ORAL | Status: DC
  Administered 2023-10-22 – 2023-10-23 (×2): 81 mg via ORAL
  Filled 2023-10-22 (×2): qty 1

## 2023-10-22 MED ORDER — MIDAZOLAM HCL 2 MG/2ML IJ SOLN
INTRAMUSCULAR | Status: AC
  Filled 2023-10-22: qty 2

## 2023-10-22 MED ORDER — VERAPAMIL HCL 2.5 MG/ML IV SOLN
INTRAVENOUS | Status: DC | PRN
  Administered 2023-10-22: 10 mL via INTRA_ARTERIAL

## 2023-10-22 MED ORDER — CARVEDILOL 6.25 MG PO TABS
6.2500 mg | ORAL_TABLET | Freq: Two times a day (BID) | ORAL | Status: DC
  Administered 2023-10-23: 6.25 mg via ORAL
  Filled 2023-10-22 (×2): qty 1

## 2023-10-22 MED ORDER — VERAPAMIL HCL 2.5 MG/ML IV SOLN
INTRAVENOUS | Status: AC
  Filled 2023-10-22: qty 2

## 2023-10-22 MED ORDER — METOPROLOL SUCCINATE ER 25 MG PO TB24
25.0000 mg | ORAL_TABLET | Freq: Every day | ORAL | Status: DC
  Administered 2023-10-22: 25 mg via ORAL
  Filled 2023-10-22: qty 1

## 2023-10-22 MED ORDER — HEPARIN SODIUM (PORCINE) 1000 UNIT/ML IJ SOLN
INTRAMUSCULAR | Status: AC
  Filled 2023-10-22: qty 10

## 2023-10-22 MED ORDER — SODIUM CHLORIDE 0.9% FLUSH: 3.0000 mL | INTRAVENOUS | Status: DC | PRN

## 2023-10-22 MED ORDER — MIDAZOLAM HCL 2 MG/2ML IJ SOLN
INTRAMUSCULAR | Status: DC | PRN
  Administered 2023-10-22: 2 mg via INTRAVENOUS

## 2023-10-22 MED ORDER — SODIUM CHLORIDE 0.9 % WEIGHT BASED INFUSION
1.0000 mL/kg/h | INTRAVENOUS | Status: DC
  Administered 2023-10-22: 1 mL/kg/h via INTRAVENOUS

## 2023-10-22 MED ORDER — SACUBITRIL-VALSARTAN 24-26 MG PO TABS
1.0000 | ORAL_TABLET | Freq: Two times a day (BID) | ORAL | Status: DC
  Administered 2023-10-23: 1 via ORAL
  Filled 2023-10-22 (×2): qty 1

## 2023-10-22 MED ORDER — HEPARIN SODIUM (PORCINE) 1000 UNIT/ML IJ SOLN
INTRAMUSCULAR | Status: DC | PRN
  Administered 2023-10-22: 2000 [IU] via INTRAVENOUS
  Administered 2023-10-22: 10000 [IU] via INTRAVENOUS

## 2023-10-22 MED ORDER — PHENYLEPHRINE 80 MCG/ML (10ML) SYRINGE FOR IV PUSH (FOR BLOOD PRESSURE SUPPORT)
PREFILLED_SYRINGE | INTRAVENOUS | Status: DC | PRN
  Administered 2023-10-22 (×2): 80 ug via INTRAVENOUS

## 2023-10-22 MED ORDER — SODIUM CHLORIDE 0.9 % WEIGHT BASED INFUSION
3.0000 mL/kg/h | INTRAVENOUS | Status: DC
Start: 2023-10-22 — End: 2023-10-22

## 2023-10-22 MED ORDER — LIDOCAINE HCL (PF) 1 % IJ SOLN
INTRAMUSCULAR | Status: DC | PRN
  Administered 2023-10-22: 2 mL

## 2023-10-22 MED ORDER — HEPARIN (PORCINE) IN NACL 1000-0.9 UT/500ML-% IV SOLN
INTRAVENOUS | Status: DC | PRN
  Administered 2023-10-22: 1000 mL via SURGICAL_CAVITY

## 2023-10-22 MED ORDER — FENTANYL CITRATE (PF) 100 MCG/2ML IJ SOLN
INTRAMUSCULAR | Status: AC
  Filled 2023-10-22: qty 2

## 2023-10-22 MED ORDER — LIDOCAINE HCL (PF) 1 % IJ SOLN
INTRAMUSCULAR | Status: AC
  Filled 2023-10-22: qty 30

## 2023-10-22 MED ORDER — SODIUM CHLORIDE 0.9 % IV SOLN
INTRAVENOUS | Status: AC | PRN
  Administered 2023-10-22: 250 mL via INTRAVENOUS

## 2023-10-22 MED ORDER — IOHEXOL 350 MG/ML SOLN
INTRAVENOUS | Status: DC | PRN
  Administered 2023-10-22: 80 mL via INTRA_ARTERIAL

## 2023-10-22 MED ORDER — CLOPIDOGREL BISULFATE 75 MG PO TABS
600.0000 mg | ORAL_TABLET | Freq: Once | ORAL | Status: AC
  Administered 2023-10-22: 600 mg via ORAL
  Filled 2023-10-22: qty 8

## 2023-10-22 MED ORDER — TICAGRELOR 90 MG PO TABS: 180.0000 mg | ORAL_TABLET | Freq: Once | ORAL | Status: DC

## 2023-10-22 MED ORDER — FENTANYL CITRATE (PF) 100 MCG/2ML IJ SOLN
INTRAMUSCULAR | Status: DC | PRN
  Administered 2023-10-22: 25 ug via INTRAVENOUS

## 2023-10-22 MED ORDER — POTASSIUM CHLORIDE CRYS ER 20 MEQ PO TBCR
40.0000 meq | EXTENDED_RELEASE_TABLET | Freq: Once | ORAL | Status: AC
  Administered 2023-10-22: 40 meq via ORAL
  Filled 2023-10-22: qty 2

## 2023-10-22 MED ORDER — SODIUM CHLORIDE 0.9 % IV SOLN: 250.0000 mL | INTRAVENOUS | Status: DC | PRN

## 2023-10-22 MED ORDER — INSULIN GLARGINE-YFGN 100 UNIT/ML ~~LOC~~ SOLN
15.0000 [IU] | Freq: Every day | SUBCUTANEOUS | Status: DC
  Administered 2023-10-22 – 2023-10-23 (×2): 15 [IU] via SUBCUTANEOUS
  Filled 2023-10-22 (×2): qty 0.15

## 2023-10-22 MED ORDER — SODIUM CHLORIDE 0.9% FLUSH
3.0000 mL | Freq: Two times a day (BID) | INTRAVENOUS | Status: DC
  Administered 2023-10-22 – 2023-10-23 (×2): 3 mL via INTRAVENOUS

## 2023-10-22 MED ORDER — ASPIRIN 81 MG PO CHEW: 81.0000 mg | CHEWABLE_TABLET | ORAL | Status: DC

## 2023-10-22 SURGICAL SUPPLY — 13 items
BALLOON EMERGE MR 2.5X20 (BALLOONS) IMPLANT
BALLOON ~~LOC~~ EMERGE MR 2.75X20 (BALLOONS) IMPLANT
CATH VISTA GUIDE 6FR XB3.5 EPK (CATHETERS) IMPLANT
DEVICE RAD COMP TR BAND LRG (VASCULAR PRODUCTS) IMPLANT
GLIDESHEATH SLEND A-KIT 6F 22G (SHEATH) IMPLANT
GUIDEWIRE INQWIRE 1.5J.035X260 (WIRE) IMPLANT
GUIDEWIRE VAS SION BLUE 190 (WIRE) IMPLANT
KIT ENCORE 26 ADVANTAGE (KITS) IMPLANT
KIT HEMO VALVE WATCHDOG (MISCELLANEOUS) IMPLANT
PACK CARDIAC CATHETERIZATION (CUSTOM PROCEDURE TRAY) ×1 IMPLANT
SET ATX-X65L (MISCELLANEOUS) IMPLANT
STENT SYNERGY XD 2.75X32 (Permanent Stent) IMPLANT
WIRE RUNTHROUGH IZANAI 014 180 (WIRE) IMPLANT

## 2023-10-22 NOTE — Inpatient Diabetes Management (Signed)
 Inpatient Diabetes Program Recommendations  AACE/ADA: New Consensus Statement on Inpatient Glycemic Control (2015)  Target Ranges:  Prepandial:   less than 140 mg/dL      Peak postprandial:   less than 180 mg/dL (1-2 hours)      Critically ill patients:  140 - 180 mg/dL   Lab Results  Component Value Date   GLUCAP 289 (H) 10/22/2023   HGBA1C 8.1 (H) 10/19/2023    Review of Glycemic Control  Latest Reference Range & Units 10/21/23 06:12 10/21/23 17:02 10/21/23 21:22 10/22/23 06:27 10/22/23 11:11  Glucose-Capillary 70 - 99 mg/dL 725 (H) 661 (H) 812 (H) 224 (H) 289 (H)  (H): Data is abnormally high  Diabetes history: DM2 Outpatient Diabetes medications: Metformin  1000 mg BID, Jardiance  12.5 mg BID Current orders for Inpatient glycemic control: Novolog  0-9 units TID   Inpatient Diabetes Program Recommendations:     Semglee 10 units every day Novolog  0-5 units at bedtime   Thank you, Wyvonna Pinal, MSN, CDCES Diabetes Coordinator Inpatient Diabetes Program [phone] (team pager from 8a-5p)

## 2023-10-22 NOTE — Progress Notes (Addendum)
  Progress Note  [name]. Date of Encounter: 10/22/2023 [facility] Cardiologist: Lonni Cash, MD   Interval Summary    No complaints this morning, wife at bedside.   Vital Signs Vitals:   10/21/23 1937 10/22/23 0001 10/22/23 0623 10/22/23 0732  BP: 127/82 113/65 (!) 146/93 139/85  Pulse: 98 88 71 99  Resp: 20 18 18 20   Temp: 98.4 F (36.9 C) 98.3 F (36.8 C) 98.1 F (36.7 C) 97.9 F (36.6 C)  TempSrc: Oral Oral Oral Oral  SpO2: 95% 98% 96% 96%  Weight:   76.9 kg   Height:        Intake/Output Summary (Last 24 hours) at 10/22/2023 0902 Last data filed at 10/22/2023 0815 Gross per 24 hour  Intake 481.46 ml  Output 775 ml  Net -293.54 ml      10/22/2023    6:23 AM 10/21/2023   11:57 AM 10/21/2023    4:00 AM  Last 3 Weights  Weight (lbs) 169 lb 8 oz 169 lb 5 oz 169 lb 4.8 oz  Weight (kg) 76.885 kg 76.8 kg 76.794 kg      Telemetry/ECG   Sinus Tachycardia rates low 100s - Personally Reviewed  Physical Exam  GEN: No acute distress.   Neck: No JVD Cardiac: RRR, no murmurs, rubs, or gallops.  Respiratory: Clear to auscultation bilaterally. GI: Soft, nontender, non-distended  MS: No edema, right radial cath site stable   Assessment & Plan   73 y.o. male with a hx of hypertension, hyperlipidemia, diabetes, BPH who was seen 10/21/2023 for the evaluation of acute HFrEF at the request of Dr. Fairy.   Acute HFrEF ICM -- presented with shortness of breath, BNP 1597 -- echo showed LVEF of 30-35%, with severe hypokinesis LV, entire inferior wall, inferolateral and anterolateral wall, normal RV, mild to moderate MR -- RHC: PCW , CI 2.1, CO 4.0 -- s/p IV lasix, net - 2.9 -- GDMT: add Toprol XL 25mg  daily, continue losartan  25mg  daily, spiro 12.5mg  daily. SGLT2i deferred in the setting of UTIs  CAD -- underwent cardiac cath with CTO of Lcx, RCA, 60% pLAD which was hemodynamically significantly via RFR. Seen by Dr. Lucas and turned  down for CABG with small diffusely disease vessels.  -- plan for PCI of LAD today, will load with plavix 600mg  x1 now (high deductible with [employer]) -- continue ASA, statin, Toprol   HTN -- controlled -- continue losartan  25mg  daily, adding Toprol XL  HLD -- check lipids, LP(a) pending -- continue atorvastatin 80mg  daily  DM -- Hgb A1c 8.1 -- on SSI, avoiding SGLT2i with hx of UTIs  BPH -- on flomax   For questions or updates, please contact [facility] Please consult [email or web address] for contact info under       Signed, Manuelita Rummer, NP   I have personally seen and examined this patient. I agree with the assessment and plan as outlined above.  He is feeling well today. He  has no chest pain.  He was not felt to have graftable vessels so turned down for CABG Will plan PCI of the LAD today.  Likely d/c home tomorrow.   Lonni Cash, MD 10/22/2023 10:05 AM

## 2023-10-22 NOTE — Plan of Care (Signed)
  Problem: Clinical Measurements: Goal: Cardiovascular complication will be avoided Outcome: Progressing   Problem: Nutrition: Goal: Adequate nutrition will be maintained Outcome: Progressing   

## 2023-10-22 NOTE — Telephone Encounter (Signed)
 Advanced Heart Failure Patient Advocate Encounter  The patient was approved for a [employer] grant that will help cover the cost of Entresto, Farxiga/Jardiance .  Total amount awarded, $4,500.  Effective: 09/22/2023 - 09/21/2023.  BIN [id number] PCN [id number] Group [id number] ID [id number]  Pharmacy provided with approval and processing information. Patient informed while admitted.  Rachel DEL, CPhT Rx Patient Advocate Phone: [phone]
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
## 2023-10-22 NOTE — Progress Notes (Signed)
 Pt received HF book and was educated on LE edema and other concerning s/s, recording daily weights and when to call provider, sodium reduction (printout provided), importance of taking meds and physical activity, and fluid restriction.   Also discussed antiplatelet use after PCI. Pt would like to be referred to CR at [facility].  Garen FORBES Candy MS, ACSM-CEP 10/22/2023 10:57 AM   [phone]

## 2023-10-22 NOTE — Interval H&P Note (Signed)
 History and Physical Interval Note:  10/22/2023 12:59 PM  Jonathan Cervantes.  has presented today for surgery, with the diagnosis of cad.  The various methods of treatment have been discussed with the patient and family. After consideration of risks, benefits and other options for treatment, the patient has consented to  Procedure(s): CORONARY STENT INTERVENTION (N/A) as a surgical intervention.  The patient's history has been reviewed, patient examined, no change in status, stable for surgery.  I have reviewed the patient's chart and labs.  Questions were answered to the patient's satisfaction.     Gordy Bergamo

## 2023-10-22 NOTE — Progress Notes (Signed)
 PROGRESS NOTE    Jonathan Cervantes.  FMW:[id number] DOB: 03-03-1951 DOA: 10/19/2023 PCP: Elio Hunter HERO, CRNP   73/M w hypertension, BPH, hyperlipidemia, diabetes mellitus type 2 has been experiencing exertional shortness of breath and orthopnea over the last 5 days.ED Course: In the ER chest x-ray shows features concerning for CHF. Troponins were 27 and 29.  BNP was 1500.  Patient was given 40 mg IV Lasix and admitted for acute CHF.   Due to urinary retention patient Foley catheter was placed   Subjective: Feels much better, breathing is improving   Assessment and Plan:  Acute systolic CHF, new diagnosis - 2D echo with EF 30-35%, regional wall motion abnormality, grade 1 DD, normal RV - Appreciate cards input, cath yesterday with multivessel CAD with collaterals, LAD disease  - Doing further diuretics, continue Aldactone, low-dose losartan  - Poor candidate for SGLT2i with urinary retention and Foley  CAD -Cath yesterday with multivessel disease, collaterals to left circumflex and RCA, 60% proximal LAD, hemodynamically significant wire FFR - Appreciate cards input, cath yesterday with multivessel CAD with collaterals, LAD disease as well, turndown for CABG on account of small diffusely diseased vessels - Plan for PCI to LAD today - Continue aspirin , statin, Toprol  Hypertension uncontrolled  - Improved, meds as above.  Elevated troponin  -Secondary to demand ischemia from CHF, no ACS  Diabetes mellitus type 2  - A1c is 8.1 -SSI for now, poor candidate for SGLT2i - Consider metformin  at discharge  Urinary retention  Foley catheter placed in ED.   - prior history of BPH and used to be on tamsulosin   - Having a lot of discomfort related to Foley catheter and urine seeping around the catheter, catheter discontinued yesterday    DVT prophylaxis:lovenox Code Status: Full Family Communication: None present Disposition Plan: Home tomorrow if stable  Consultants:     Procedures:   Antimicrobials:    Objective: Vitals:   10/22/23 0001 10/22/23 0623 10/22/23 0732 10/22/23 1113  BP: 113/65 (!) 146/93 139/85 (!) 143/87  Pulse: 88 71 99 95  Resp: 18 18 20 20   Temp: 98.3 F (36.8 C) 98.1 F (36.7 C) 97.9 F (36.6 C) 98 F (36.7 C)  TempSrc: Oral Oral Oral Oral  SpO2: 98% 96% 96% 94%  Weight:  76.9 kg    Height:        Intake/Output Summary (Last 24 hours) at 10/22/2023 1140 Last data filed at 10/22/2023 1116 Gross per 24 hour  Intake 481.46 ml  Output 700 ml  Net -218.54 ml   Filed Weights   10/21/23 0400 10/21/23 1157 10/22/23 0623  Weight: 76.8 kg 76.8 kg 76.9 kg    Examination:  Gen: Awake, Alert, Oriented X 3,  HEENT: no JVD Lungs: Basilar rales CVS: S1S2/RRR Abd: soft, Non tender, non distended, BS present Extremities: No edema Skin: no new rashes on exposed skin    Data Reviewed:   CBC: Recent Labs  Lab 10/19/23 1239 10/19/23 2127 10/20/23 0145 10/21/23 1312 10/21/23 1316 10/21/23 1518  WBC 6.7 9.0 8.2  --   --  8.9  HGB 16.5 16.5 16.7 15.3 15.3  15.6 16.7  HCT 47.9 47.9 48.7 45.0 45.0  46.0 48.4  MCV 86.9 86.2 87.3  --   --  86.0  PLT 204 225 220  --   --  243   Basic Metabolic Panel: Recent Labs  Lab 10/19/23 1239 10/19/23 2127 10/20/23 0145 10/21/23 0240 10/21/23 1312 10/21/23 1316 10/21/23 1454 10/22/23 0234  NA 140  --  137 136 137 137  137  --  136  K 4.1  --  4.1 3.5 3.3* 3.3*  3.3*  --  3.4*  CL 104  --  103 99  --   --   --  98  CO2 24  --  23 26  --   --   --  26  GLUCOSE 287*  --  343* 278*  --   --   --  209*  BUN 18  --  17 24*  --   --   --  31*  CREATININE 1.02 1.02 1.15 1.23  --   --  1.17 1.34*  CALCIUM 9.2  --  9.3 9.1  --   --   --  9.0  MG  --   --  2.0 2.0  --   --   --   --    GFR: Estimated Creatinine Clearance: 52.2 mL/min (A) (by C-G formula based on SCr of 1.34 mg/dL (H)). Liver Function Tests: Recent Labs  Lab 10/19/23 1239  AST 18  ALT 16  ALKPHOS 73   BILITOT 0.7  PROT 6.8  ALBUMIN 4.3   No results for input(s): LIPASE, AMYLASE in the last 168 hours. No results for input(s): AMMONIA in the last 168 hours. Coagulation Profile: No results for input(s): INR, PROTIME in the last 168 hours. Cardiac Enzymes: No results for input(s): CKTOTAL, CKMB, CKMBINDEX, TROPONINI in the last 168 hours. BNP (last 3 results) Recent Labs    10/19/23 1239  PROBNP 1,597.0*   HbA1C: Recent Labs    10/19/23 2127  HGBA1C 8.1*   CBG: Recent Labs  Lab 10/21/23 0612 10/21/23 1702 10/21/23 2122 10/22/23 0627 10/22/23 1111  GLUCAP 274* 338* 187* 224* 289*   Lipid Profile: No results for input(s): CHOL, HDL, LDLCALC, TRIG, CHOLHDL, LDLDIRECT in the last 72 hours. Thyroid  Function Tests: Recent Labs    10/20/23 0145  TSH 4.454   Anemia Panel: No results for input(s): VITAMINB12, FOLATE, FERRITIN, TIBC, IRON, RETICCTPCT in the last 72 hours. Urine analysis:    Component Value Date/Time   COLORURINE YELLOW (A) 07/15/2016 0201   APPEARANCEUR Cloudy (A) 07/17/2016 1149   LABSPEC 1.028 07/15/2016 0201   PHURINE 5.0 07/15/2016 0201   GLUCOSEU 3+ (A) 07/17/2016 1149   HGBUR LARGE (A) 07/15/2016 0201   BILIRUBINUR Negative 07/17/2016 1149   KETONESUR 5 (A) 07/15/2016 0201   PROTEINUR 2+ (A) 07/17/2016 1149   PROTEINUR 100 (A) 07/15/2016 0201   NITRITE Negative 07/17/2016 1149   NITRITE NEGATIVE 07/15/2016 0201   LEUKOCYTESUR Trace (A) 07/17/2016 1149   Sepsis Labs: @LABRCNTIP (procalcitonin:4,lacticidven:4)  ) Recent Results (from the past 240 hours)  Resp panel by RT-PCR (RSV, Flu A&B, Covid) Anterior Nasal Swab     Status: None   Collection Time: 10/19/23 12:39 PM   Specimen: Anterior Nasal Swab  Result Value Ref Range Status   SARS Coronavirus 2 by RT PCR NEGATIVE NEGATIVE Final    Comment: (NOTE) SARS-CoV-2 target nucleic acids are NOT DETECTED.  The SARS-CoV-2 RNA is generally  detectable in upper respiratory specimens during the acute phase of infection. The lowest concentration of SARS-CoV-2 viral copies this assay can detect is 138 copies/mL. A negative result does not preclude SARS-Cov-2 infection and should not be used as the sole basis for treatment or other patient management decisions. A negative result may occur with  improper specimen collection/handling, submission of specimen other than nasopharyngeal swab, presence of  viral mutation(s) within the areas targeted by this assay, and inadequate number of viral copies(<138 copies/mL). A negative result must be combined with clinical observations, patient history, and epidemiological information. The expected result is Negative.  Fact Sheet for Patients:  BloggerCourse.com  Fact Sheet for Healthcare Providers:  SeriousBroker.it  This test is no t yet approved or cleared by the [place]  FDA and  has been authorized for detection and/or diagnosis of SARS-CoV-2 by FDA under an Emergency Use Authorization (EUA). This EUA will remain  in effect (meaning this test can be used) for the duration of the COVID-19 declaration under Section 564(b)(1) of the Act, 21 U.S.C.section 360bbb-3(b)(1), unless the authorization is terminated  or revoked sooner.       Influenza A by PCR NEGATIVE NEGATIVE Final   Influenza B by PCR NEGATIVE NEGATIVE Final    Comment: (NOTE) The Xpert Xpress SARS-CoV-2/FLU/RSV plus assay is intended as an aid in the diagnosis of influenza from Nasopharyngeal swab specimens and should not be used as a sole basis for treatment. Nasal washings and aspirates are unacceptable for Xpert Xpress SARS-CoV-2/FLU/RSV testing.  Fact Sheet for Patients: BloggerCourse.com  Fact Sheet for Healthcare Providers: SeriousBroker.it  This test is not yet approved or cleared by the [place]  FDA  and has been authorized for detection and/or diagnosis of SARS-CoV-2 by FDA under an Emergency Use Authorization (EUA). This EUA will remain in effect (meaning this test can be used) for the duration of the COVID-19 declaration under Section 564(b)(1) of the Act, 21 U.S.C. section 360bbb-3(b)(1), unless the authorization is terminated or revoked.     Resp Syncytial Virus by PCR NEGATIVE NEGATIVE Final    Comment: (NOTE) Fact Sheet for Patients: BloggerCourse.com  Fact Sheet for Healthcare Providers: SeriousBroker.it  This test is not yet approved or cleared by the [place]  FDA and has been authorized for detection and/or diagnosis of SARS-CoV-2 by FDA under an Emergency Use Authorization (EUA). This EUA will remain in effect (meaning this test can be used) for the duration of the COVID-19 declaration under Section 564(b)(1) of the Act, 21 U.S.C. section 360bbb-3(b)(1), unless the authorization is terminated or revoked.  Performed at [facility], [address] [place], [place] [address]      Radiology Studies: CARDIAC CATHETERIZATION Addendum Date: 10/22/2023 Coronary angiography 10/21/2023: LM: No significant disease LAD: Ostial 30%, prox-mid diffuse 60%, distal focal 60% disease          Grade 3 left-to-right collaterals from septals to RPDA/RPLA, grade 1 left-to-left collaterals to distal OM2          RFR: 0.41 dLAD, 0.59 mLAD with significant step up in prox to mid LAD Lcx: Prox occlusion after bifurcating OM1 with 90% proximal disease RCA: Mid focal 95% stenosis, followed by mid occlusion. Distal RCA/RPDA/RPLA well collateralized from LAD septals          RPDA 90% stenosis LVEDP 17 mmHg Right heart catheterization 10/21/2023: RA: 8 mmHg RV: 31/7 mmHg PA: 30/20 mmHg, mPAP 23 mmHg PCW: 16 mmHg AO sats: 92% PA sats: 64% CO: 4.0 L/min CI: 2.1 L/min/m2 Severe multivessel coronary artery disease Mildly decompensated ischemic  cardiomyopathy Mild PH, WHO Grp II While LAD RFR can be affected by significant collateralization to RCA and some to Lcx, step up in RFR is clearly in prox LAD, suggesting that LAD itself may be hemodynamically significant Continue GDMT for HFrEF CVTS consultation for CABG Newman JINNY Lawrence, MD   Addendum Date: 10/22/2023 Coronary angiography 10/21/2023:  LM: No significant disease LAD: Ostial 30%, prox-mid diffuse 60%, distal focal 60% disease          Grade 3 left-to-right collaterals from septals to RPDA/RPLA, grade 1 left-to-left collaterals to distal OM2          RFR: 0.41 dLAD, 0.59 mLAD with significant step up in prox to mid LAD Lcx: Prox occlusion after bifurcating OM1 with 90% proximal disease RCA: Mid focal 95% stenosis, followed by mid occlusion. Distal RCA/RPDA/RPLA well collateralized from LAD septals LVEDP 17 mmHg Right heart catheterization 10/21/2023: RA: 8 mmHg RV: 31/7 mmHg PA: 30/20 mmHg, mPAP 23 mmHg PCW: 16 mmHg AO sats: 92% PA sats: 64% CO: 4.0 L/min CI: 2.1 L/min/m2 Severe multivessel coronary artery disease Mildly decompensated ischemic cardiomyopathy Mild PH, WHO Grp II While LAD RFR can be affected by significant collateralization to RCA and some to Lcx, step up in RFR is clearly in prox LAD, suggesting that LAD itself may be hemodynamically significant Continue GDMT for HFrEF CVTS consultation for CABG Newman JINNY Lawrence, MD   Result Date: 10/22/2023 Images from the original result were not included. Coronary angiography 10/21/2023: LM: No significant disease LAD: Ostial 30%, prox-mid diffuse 60%, distal focal 60% disease          Grade 3 left-to-right collaterals from septals to LAD, grade 1 left-to-left collaterals to distal OM2          RFR: 0.41 dLAD, 0.59 mLAD with significant step up in prox to mid LAD Lcx: Prox occlusion after bifurcating OM1 with 90% proximal disease RCA: Mid focal 95% stenosis, followed by mid occlusion. Distal RCA/RPDA/RPLA well collateralized from LAD septals  LVEDP 17 mmHg Right heart catheterization 10/21/2023: RA: 8 mmHg RV: 31/7 mmHg PA: 30/20 mmHg, mPAP 23 mmHg PCW: 16 mmHg AO sats: 92% PA sats: 64% CO: 4.0 L/min CI: 2.1 L/min/m2 Severe multivessel coronary artery disease Mildly decompensated ischemic cardiomyopathy Mild PH, WHO Grp II While LAD RFR can be affected by significant collateralization to RCA and some to Lcx, step up in RFR is clearly in prox LAD, suggesting that LAD itself may be hemodynamically significant Continue GDMT for HFrEF CVTS consultation for CABG Newman JINNY Lawrence, MD   ECHOCARDIOGRAM COMPLETE Result Date: 10/20/2023    ECHOCARDIOGRAM REPORT   Patient Name:   Alban Marucci. Date of Exam: 10/20/2023 Medical Rec #:  [id number]         Height:       70.0 in Accession #:    [id number]        Weight:       172.6 lb Date of Birth:  10-19-1950         BSA:          1.961 m Patient Age:    73 years          BP:           134/75 mmHg Patient Gender: M                 HR:           101 bpm. Exam Location:  Inpatient Procedure: 2D Echo, Color Doppler and Cardiac Doppler (Both Spectral and Color            Flow Doppler were utilized during procedure). Indications:    CHF  History:        Patient has no prior history of Echocardiogram examinations.  CHF; Risk Factors:Hypertension.  Sonographer:    Vella Key Referring Phys: REDIA LOISE CLEAVER IMPRESSIONS  1. Left ventricular ejection fraction, by estimation, is 30 to 35%. The left ventricle has moderately decreased function. The left ventricle demonstrates regional wall motion abnormalities (see scoring diagram/findings for description). The left ventricular internal cavity size was mildly dilated. Left ventricular diastolic parameters are consistent with Grade I diastolic dysfunction (impaired relaxation). There is severe hypokinesis of the left ventricular, entire inferior wall, inferolateral wall and anterolateral wall.  2. Right ventricular systolic function is normal. The right  ventricular size is normal. Tricuspid regurgitation signal is inadequate for assessing PA pressure.  3. The mitral valve is normal in structure. Mild to moderate mitral valve regurgitation. No evidence of mitral stenosis.  4. The aortic valve is tricuspid. Aortic valve regurgitation is not visualized. No aortic stenosis is present.  5. Aortic dilatation noted. There is borderline dilatation of the aortic root, measuring 39 mm.  6. The inferior vena cava is normal in size with greater than 50% respiratory variability, suggesting right atrial pressure of 3 mmHg. Comparison(s): No prior Echocardiogram. Findings suggest infarction or severe resting ischemia in the territory of the left circumflex and right coronary arteries. FINDINGS  Left Ventricle: No left ventricular thrombus is seen. Left ventricular ejection fraction, by estimation, is 30 to 35%. The left ventricle has moderately decreased function. The left ventricle demonstrates regional wall motion abnormalities. Severe hypokinesis of the left ventricular, entire inferior wall, inferolateral wall and anterolateral wall. The left ventricular internal cavity size was mildly dilated. There is no left ventricular hypertrophy. Left ventricular diastolic parameters are consistent with Grade I diastolic dysfunction (impaired relaxation). Indeterminate filling pressures.  LV Wall Scoring: The antero-lateral wall, entire inferior wall, and posterior wall are hypokinetic. The entire anterior wall, entire septum, apical lateral segment, and apex are normal. Right Ventricle: The right ventricular size is normal. No increase in right ventricular wall thickness. Right ventricular systolic function is normal. Tricuspid regurgitation signal is inadequate for assessing PA pressure. Left Atrium: Left atrial size was normal in size. Right Atrium: Right atrial size was normal in size. Pericardium: There is no evidence of pericardial effusion. Mitral Valve: The mitral valve is  normal in structure. Mild to moderate mitral valve regurgitation, with centrally-directed jet. No evidence of mitral valve stenosis. Tricuspid Valve: The tricuspid valve is normal in structure. Tricuspid valve regurgitation is not demonstrated. Aortic Valve: The aortic valve is tricuspid. Aortic valve regurgitation is not visualized. No aortic stenosis is present. Pulmonic Valve: The pulmonic valve was not well visualized. Pulmonic valve regurgitation is not visualized. No evidence of pulmonic stenosis. Aorta: Aortic dilatation noted. There is borderline dilatation of the aortic root, measuring 39 mm. Venous: The inferior vena cava is normal in size with greater than 50% respiratory variability, suggesting right atrial pressure of 3 mmHg. IAS/Shunts: No atrial level shunt detected by color flow Doppler.  LEFT VENTRICLE PLAX 2D LVIDd:         5.70 cm      Diastology LVIDs:         5.20 cm      LV e' medial:    5.44 cm/s LV PW:         0.80 cm      LV E/e' medial:  15.7 LV IVS:        1.00 cm      LV e' lateral:   6.20 cm/s LVOT diam:     2.07 cm  LV E/e' lateral: 13.8 LV SV:         51 LV SV Index:   26 LVOT Area:     3.37 cm  LV Volumes (MOD) LV vol d, MOD A2C: 194.0 ml LV vol d, MOD A4C: 170.0 ml LV vol s, MOD A2C: 131.0 ml LV vol s, MOD A4C: 127.0 ml LV SV MOD A2C:     63.0 ml LV SV MOD A4C:     170.0 ml LV SV MOD BP:      54.4 ml RIGHT VENTRICLE RV Basal diam:  2.60 cm RV S prime:     8.27 cm/s TAPSE (M-mode): 1.5 cm LEFT ATRIUM             Index        RIGHT ATRIUM          Index LA diam:        3.50 cm 1.79 cm/m   RA Area:     8.76 cm LA Vol (A2C):   28.1 ml 14.33 ml/m  RA Volume:   15.30 ml 7.80 ml/m LA Vol (A4C):   25.0 ml 12.75 ml/m LA Biplane Vol: 28.4 ml 14.49 ml/m  AORTIC VALVE LVOT Vmax:   90.20 cm/s LVOT Vmean:  62.800 cm/s LVOT VTI:    0.152 m  AORTA Ao Root diam: 3.90 cm MITRAL VALVE MV Area (PHT): 7.51 cm     SHUNTS MV Decel Time: 101 msec     Systemic VTI:  0.15 m MV E velocity: 85.40  cm/s   Systemic Diam: 2.07 cm MV A velocity: 121.00 cm/s MV E/A ratio:  0.71 Mihai Croitoru MD Electronically signed by Jerel Balding MD Signature Date/Time: 10/20/2023/4:22:39 PM    Final      Scheduled Meds:  aspirin  EC  81 mg Oral Daily   Chlorhexidine  Gluconate Cloth  6 each Topical Daily   heparin  5,000 Units Subcutaneous Q8H   insulin  aspart  0-9 Units Subcutaneous TID WC   insulin  glargine-yfgn  15 Units Subcutaneous Daily   losartan   25 mg Oral Daily   metoprolol succinate  25 mg Oral Daily   nitroGLYCERIN  1 inch Topical Once   sodium chloride  flush  3 mL Intravenous Q12H   spironolactone  12.5 mg Oral Daily   tamsulosin   0.4 mg Oral QPC supper   Continuous Infusions:  sodium chloride        LOS: 2 days    Time spent:    Sigurd Pac, MD [facility]   10/22/2023, 11:40 AM

## 2023-10-22 NOTE — Telephone Encounter (Signed)
 Pharmacy Patient Advocate Encounter  Insurance verification completed.    The patient is insured through [employer]. Patient has Medicare and is not eligible for a copay card, but may be able to apply for patient assistance or Medicare RX Payment Plan (Patient Must reach out to their plan, if eligible for payment plan), if available.    Ran test claim for Entresto and the current 30 day co-pay is $547.56.  Ran test claim for Jardiance  and the current 30 day co-pay is $529.88.  Ran test claim for Doreen and the current 30 day co-pay is $522.94.  Ran test claim for Ticagrelor (generic) and the current 30 day co-pay is $238.18.  High costs due to remaining deductible of approximately $350  This test claim was processed through [facility]- copay amounts may vary at other pharmacies due to [employer], or as the patient moves through the different stages of their insurance plan.

## 2023-10-22 NOTE — Progress Notes (Addendum)
 Heart Failure Stewardship Pharmacist Progress Note   PCP: Elio Hunter HERO, CRNP PCP-Cardiologist: Lonni Cash, MD    HPI:  73 yo M with PMH of HTN, HLD, diabetes, and BPH.   Presented to the ED on 6/30 with shortness of breath, congestion, orthopnea, cough, and chest tightness. CXR with small bilateral pleural effusions, suggestive of CHF. proBNP elevated. Trop 27>29. Foley placed temporarily due to urinary retention. ECHO 7/1 showed LVEF 30-35%, RWMA, G1DD, RV normal, mild to moderate MR. Taken for [facility] on 7/2 and found to have severe multivessel disease with L>R collaterals. RA 8, PA 23, wedge 16, CO 4, CI 2.1. TCTS consulted for possible CABG but deemed not a surgical candidate because the distal RCA branches and LCx are not graftable. Scheduled for elective PCI to LAD today.   Met with patient and his wife at bedside. Denies shortness of breath and reports improvement in LE edema. Reviewed changes to GDMT. Patient reports that he had intolerances to losartan  and lisinopril in the past due to cough. He has felt like today he feels the need to clear his throat and is concerned the losartan  is causing cough again. He also reports intolerance to metoprolol a couple years ago, but cannot remember the side effect he experienced. He is open to switching the ARB and BB to other therapies. He also reports taking Jardiance  within the past two years for diabetes. He states it worked well for a while but the last couple months with taking it, he had increased urination and burning, but states he was not found to have UTI per his PCP. He would like to take a slower approach to medications so he can better pinpoint intolerances. He has limited income so would need patient assistance via grants if Entresto/SGLT2i started.   Current HF Medications: Beta Blocker: losartan  25 mg daily ACE/ARB/ARNI: metoprolol XL 25 mg daily MRA: spironolactone 12.5 mg daily  Prior to admission HF  Medications: None  Pertinent Lab Values: Serum creatinine 1.34, BUN 31, Potassium 3.4, Sodium 136, proBNP 1597, Magnesium 2.0, A1c 8.1   Vital Signs: Weight: 169 lbs (admission weight: 176 lbs) Blood pressure: 140/80s  Heart rate: 90s  I/O: net even yesterday; net -3.1L since admission  Medication Assistance / Insurance Benefits Check: Does the patient have prescription insurance?  Yes Type of insurance plan: Medicare  Does the patient qualify for medication assistance through manufacturers or grants?   Yes Eligible grants and/or patient assistance programs: [employer] grant Medication assistance applications in progress: none  Medication assistance applications approved: none Approved medication assistance renewals will be completed by: pending  Outpatient Pharmacy:  Prior to admission outpatient [facility] Is the patient willing to use [facility] TOC pharmacy at discharge? Yes Is the patient willing to transition their outpatient pharmacy to utilize a [facility] outpatient pharmacy?   No    Assessment: 1. Acute systolic CHF (LVEF 30-35%), due to ICM. NYHA class II symptoms. - Now off IV lasix. Strict I/Os and daily weights. Keep K>4 and Mg>2. Recommend KCl 40 mEq x 1 today.  - Agree with adding metoprolol XL 25 mg daily, but after discussing with patient on previous intolerance, consider transitioning to carvedilol  6.25 mg BID - On losartan  25 mg daily, consider transitioning to Entresto 24/26 mg BID  - Continue spironolactone 12.5 mg daily - Consider retrying SGLT2i as outpatient if patient agreeable - he reported intolerance to Jardiance  but increased urination could have been a result to developing heart failure. Can consider Farxiga  10 mg daily at follow up.   Plan: 1) Medication changes recommended at this time: - KCl 40 mEq x 1 - Stop metoprolol XL 25 mg daily - Start carvedilol  6.25 mg BID - Stop losartan  25 mg daily - Start Entresto 24/26 mg BID  2) Patient  assistance: - Has $350 deductible left on insurance - Entresto copay [phone] initially until deductible is met - Jardiance  copay $529 initially until deductible is met - Farixga copay [phone] initially until deductible is met - Copays are unaffordable - [employer] grant may be obtained if Entresto/SGLT2i started  3)  Education  - Patient has been educated on current HF medications and potential additions to HF medication regimen - Patient verbalizes understanding that over the next few months, these medication doses may change and more medications may be added to optimize HF regimen - Patient has been educated on basic disease state pathophysiology and goals of therapy   Duwaine Plant, PharmD, BCPS Heart Failure Engineer, building services Phone [phone]

## 2023-10-22 NOTE — H&P (View-Only) (Signed)
  Progress Note  [name]. Date of Encounter: 10/22/2023 [facility] Cardiologist: Lonni Cash, MD   Interval Summary    No complaints this morning, wife at bedside.   Vital Signs Vitals:   10/21/23 1937 10/22/23 0001 10/22/23 0623 10/22/23 0732  BP: 127/82 113/65 (!) 146/93 139/85  Pulse: 98 88 71 99  Resp: 20 18 18 20   Temp: 98.4 F (36.9 C) 98.3 F (36.8 C) 98.1 F (36.7 C) 97.9 F (36.6 C)  TempSrc: Oral Oral Oral Oral  SpO2: 95% 98% 96% 96%  Weight:   76.9 kg   Height:        Intake/Output Summary (Last 24 hours) at 10/22/2023 0902 Last data filed at 10/22/2023 0815 Gross per 24 hour  Intake 481.46 ml  Output 775 ml  Net -293.54 ml      10/22/2023    6:23 AM 10/21/2023   11:57 AM 10/21/2023    4:00 AM  Last 3 Weights  Weight (lbs) 169 lb 8 oz 169 lb 5 oz 169 lb 4.8 oz  Weight (kg) 76.885 kg 76.8 kg 76.794 kg      Telemetry/ECG   Sinus Tachycardia rates low 100s - Personally Reviewed  Physical Exam  GEN: No acute distress.   Neck: No JVD Cardiac: RRR, no murmurs, rubs, or gallops.  Respiratory: Clear to auscultation bilaterally. GI: Soft, nontender, non-distended  MS: No edema, right radial cath site stable   Assessment & Plan   73 y.o. male with a hx of hypertension, hyperlipidemia, diabetes, BPH who was seen 10/21/2023 for the evaluation of acute HFrEF at the request of Dr. Fairy.   Acute HFrEF ICM -- presented with shortness of breath, BNP 1597 -- echo showed LVEF of 30-35%, with severe hypokinesis LV, entire inferior wall, inferolateral and anterolateral wall, normal RV, mild to moderate MR -- RHC: PCW , CI 2.1, CO 4.0 -- s/p IV lasix, net - 2.9 -- GDMT: add Toprol XL 25mg  daily, continue losartan  25mg  daily, spiro 12.5mg  daily. SGLT2i deferred in the setting of UTIs  CAD -- underwent cardiac cath with CTO of Lcx, RCA, 60% pLAD which was hemodynamically significantly via RFR. Seen by Dr. Lucas and turned  down for CABG with small diffusely disease vessels.  -- plan for PCI of LAD today, will load with plavix 600mg  x1 now (high deductible with [employer]) -- continue ASA, statin, Toprol   HTN -- controlled -- continue losartan  25mg  daily, adding Toprol XL  HLD -- check lipids, LP(a) pending -- continue atorvastatin 80mg  daily  DM -- Hgb A1c 8.1 -- on SSI, avoiding SGLT2i with hx of UTIs  BPH -- on flomax   For questions or updates, please contact [facility] Please consult [email or web address] for contact info under       Signed, Manuelita Rummer, NP   I have personally seen and examined this patient. I agree with the assessment and plan as outlined above.  He is feeling well today. He  has no chest pain.  He was not felt to have graftable vessels so turned down for CABG Will plan PCI of the LAD today.  Likely d/c home tomorrow.   Lonni Cash, MD 10/22/2023 10:05 AM

## 2023-10-22 NOTE — Plan of Care (Signed)

## 2023-10-23 DIAGNOSIS — I5023 Acute on chronic systolic (congestive) heart failure: Secondary | ICD-10-CM | POA: Diagnosis not present

## 2023-10-23 DIAGNOSIS — E782 Mixed hyperlipidemia: Secondary | ICD-10-CM

## 2023-10-23 DIAGNOSIS — I255 Ischemic cardiomyopathy: Secondary | ICD-10-CM | POA: Diagnosis not present

## 2023-10-23 DIAGNOSIS — I25118 Atherosclerotic heart disease of native coronary artery with other forms of angina pectoris: Secondary | ICD-10-CM | POA: Diagnosis not present

## 2023-10-23 LAB — BASIC METABOLIC PANEL WITH GFR
Anion gap: 6 (ref 5–15)
BUN: 24 mg/dL — ABNORMAL HIGH (ref 8–23)
CO2: 28 mmol/L (ref 22–32)
Calcium: 8.5 mg/dL — ABNORMAL LOW (ref 8.9–10.3)
Chloride: 102 mmol/L (ref 98–111)
Creatinine, Ser: 1.18 mg/dL (ref 0.61–1.24)
GFR, Estimated: >60 mL/min (ref >60)
Glucose, Bld: 108 mg/dL — ABNORMAL HIGH (ref 70–99)
Potassium: 3.9 mmol/L (ref 3.5–5.1)
Sodium: 136 mmol/L (ref 135–145)

## 2023-10-23 LAB — CBC
HCT: 43.7 % (ref 39.0–52.0)
Hemoglobin: 15 g/dL (ref 13.0–17.0)
MCH: 29.9 pg (ref 26.0–34.0)
MCHC: 34.3 g/dL (ref 30.0–36.0)
MCV: 87.1 fL (ref 80.0–100.0)
Platelets: 216 K/uL (ref 150–400)
RBC: 5.02 MIL/uL (ref 4.22–5.81)
RDW: 12.5 % (ref 11.5–15.5)
WBC: 8 K/uL (ref 4.0–10.5)
nRBC: 0 % (ref 0.0–0.2)

## 2023-10-23 LAB — GLUCOSE, CAPILLARY
Glucose-Capillary: 151 mg/dL — ABNORMAL HIGH (ref 70–99)
Glucose-Capillary: 281 mg/dL — ABNORMAL HIGH (ref 70–99)

## 2023-10-23 LAB — LIPID PANEL
Cholesterol: 203 mg/dL — ABNORMAL HIGH (ref 0–200)
HDL: 30 mg/dL — ABNORMAL LOW (ref >40)
LDL Cholesterol: 145 mg/dL — ABNORMAL HIGH (ref 0–99)
Total CHOL/HDL Ratio: 6.8 ratio
Triglycerides: 142 mg/dL (ref <150)
VLDL: 28 mg/dL (ref 0–40)

## 2023-10-23 LAB — MAGNESIUM: Magnesium: 2.2 mg/dL (ref 1.7–2.4)

## 2023-10-23 MED ORDER — SPIRONOLACTONE 25 MG PO TABS: 12.5000 mg | ORAL_TABLET | Freq: Every day | ORAL | 1 refills | Status: DC

## 2023-10-23 MED ORDER — METOPROLOL SUCCINATE ER 50 MG PO TB24: 50.0000 mg | ORAL_TABLET | Freq: Every day | ORAL | 1 refills | Status: AC

## 2023-10-23 MED ORDER — CLOPIDOGREL BISULFATE 75 MG PO TABS: 75.0000 mg | ORAL_TABLET | Freq: Every day | ORAL | 1 refills | Status: DC

## 2023-10-23 MED ORDER — SACUBITRIL-VALSARTAN 24-26 MG PO TABS: 1.0000 | ORAL_TABLET | Freq: Two times a day (BID) | ORAL | 1 refills | Status: DC

## 2023-10-23 MED ORDER — CARVEDILOL 6.25 MG PO TABS: 6.2500 mg | ORAL_TABLET | Freq: Two times a day (BID) | ORAL | 1 refills | Status: DC

## 2023-10-23 MED ORDER — ROSUVASTATIN CALCIUM 20 MG PO TABS
40.0000 mg | ORAL_TABLET | Freq: Every day | ORAL | Status: DC
  Administered 2023-10-23: 40 mg via ORAL
  Filled 2023-10-23: qty 2

## 2023-10-23 MED ORDER — CLOPIDOGREL BISULFATE 75 MG PO TABS
75.0000 mg | ORAL_TABLET | Freq: Every day | ORAL | Status: DC
  Administered 2023-10-23: 75 mg via ORAL
  Filled 2023-10-23: qty 1

## 2023-10-23 MED ORDER — TAMSULOSIN HCL 0.4 MG PO CAPS: 0.4000 mg | ORAL_CAPSULE | Freq: Every day | ORAL | 1 refills | Status: AC

## 2023-10-23 MED ORDER — ASPIRIN 81 MG PO TBEC: 81.0000 mg | DELAYED_RELEASE_TABLET | Freq: Every day | ORAL | 1 refills | Status: AC

## 2023-10-23 MED ORDER — ROSUVASTATIN CALCIUM 40 MG PO TABS
40.0000 mg | ORAL_TABLET | Freq: Every day | ORAL | 1 refills | Status: AC
Start: 2023-10-24 — End: ?

## 2023-10-23 MED ORDER — FUROSEMIDE 20 MG PO TABS: 20.0000 mg | ORAL_TABLET | ORAL | 0 refills | Status: AC | PRN

## 2023-10-23 NOTE — Plan of Care (Signed)
 Problem: Education: Goal: Knowledge of General Education information will improve Description: Including pain rating scale, medication(s)/side effects and non-pharmacologic comfort measures 10/23/2023 1402 by Emil Roselie SAUNDERS, RN Outcome: Adequate for Discharge 10/23/2023 1402 by Emil Roselie SAUNDERS, RN Outcome: Adequate for Discharge   Problem: Health Behavior/Discharge Planning: Goal: Ability to manage health-related needs will improve 10/23/2023 1402 by Emil Roselie SAUNDERS, RN Outcome: Adequate for Discharge 10/23/2023 1402 by Emil Roselie SAUNDERS, RN Outcome: Adequate for Discharge   Problem: Clinical Measurements: Goal: Ability to maintain clinical measurements within normal limits will improve 10/23/2023 1402 by Emil Roselie SAUNDERS, RN Outcome: Adequate for Discharge 10/23/2023 1402 by Emil Roselie SAUNDERS, RN Outcome: Adequate for Discharge Goal: Will remain free from infection 10/23/2023 1402 by Emil Roselie SAUNDERS, RN Outcome: Adequate for Discharge 10/23/2023 1402 by Emil Roselie SAUNDERS, RN Outcome: Adequate for Discharge Goal: Diagnostic test results will improve 10/23/2023 1402 by Emil Roselie SAUNDERS, RN Outcome: Adequate for Discharge 10/23/2023 1402 by Emil Roselie SAUNDERS, RN Outcome: Adequate for Discharge Goal: Respiratory complications will improve 10/23/2023 1402 by Emil Roselie SAUNDERS, RN Outcome: Adequate for Discharge 10/23/2023 1402 by Emil Roselie SAUNDERS, RN Outcome: Adequate for Discharge Goal: Cardiovascular complication will be avoided 10/23/2023 1402 by Emil Roselie SAUNDERS, RN Outcome: Adequate for Discharge 10/23/2023 1402 by Emil Roselie SAUNDERS, RN Outcome: Adequate for Discharge   Problem: Activity: Goal: Risk for activity intolerance will decrease 10/23/2023 1402 by Emil Roselie SAUNDERS, RN Outcome: Adequate for Discharge 10/23/2023 1402 by Emil Roselie SAUNDERS, RN Outcome: Adequate for Discharge   Problem: Nutrition: Goal: Adequate nutrition will be maintained 10/23/2023 1402 by  Emil Roselie SAUNDERS, RN Outcome: Adequate for Discharge 10/23/2023 1402 by Emil Roselie SAUNDERS, RN Outcome: Adequate for Discharge   Problem: Coping: Goal: Level of anxiety will decrease 10/23/2023 1402 by Emil Roselie SAUNDERS, RN Outcome: Adequate for Discharge 10/23/2023 1402 by Emil Roselie SAUNDERS, RN Outcome: Adequate for Discharge   Problem: Elimination: Goal: Will not experience complications related to bowel motility 10/23/2023 1402 by Emil Roselie SAUNDERS, RN Outcome: Adequate for Discharge 10/23/2023 1402 by Emil Roselie SAUNDERS, RN Outcome: Adequate for Discharge Goal: Will not experience complications related to urinary retention 10/23/2023 1402 by Emil Roselie SAUNDERS, RN Outcome: Adequate for Discharge 10/23/2023 1402 by Emil Roselie SAUNDERS, RN Outcome: Adequate for Discharge   Problem: Pain Managment: Goal: General experience of comfort will improve and/or be controlled 10/23/2023 1402 by Emil Roselie SAUNDERS, RN Outcome: Adequate for Discharge 10/23/2023 1402 by Emil Roselie SAUNDERS, RN Outcome: Adequate for Discharge   Problem: Safety: Goal: Ability to remain free from injury will improve 10/23/2023 1402 by Emil Roselie SAUNDERS, RN Outcome: Adequate for Discharge 10/23/2023 1402 by Emil Roselie SAUNDERS, RN Outcome: Adequate for Discharge   Problem: Skin Integrity: Goal: Risk for impaired skin integrity will decrease 10/23/2023 1402 by Emil Roselie SAUNDERS, RN Outcome: Adequate for Discharge 10/23/2023 1402 by Emil Roselie SAUNDERS, RN Outcome: Adequate for Discharge   Problem: Education: Goal: Ability to describe self-care measures that may prevent or decrease complications (Diabetes Survival Skills Education) will improve 10/23/2023 1402 by Emil Roselie SAUNDERS, RN Outcome: Adequate for Discharge 10/23/2023 1402 by Emil Roselie SAUNDERS, RN Outcome: Adequate for Discharge Goal: Individualized Educational Video(s) 10/23/2023 1402 by Emil Roselie SAUNDERS, RN Outcome: Adequate for Discharge 10/23/2023 1402 by  Emil Roselie SAUNDERS, RN Outcome: Adequate for Discharge   Problem: Coping: Goal: Ability to adjust to condition or change in health will improve 10/23/2023 1402 by Emil Roselie SAUNDERS, RN Outcome: Adequate for Discharge 10/23/2023 1402 by  Emil Roselie SAUNDERS, RN Outcome: Adequate for Discharge   Problem: Fluid Volume: Goal: Ability to maintain a balanced intake and output will improve 10/23/2023 1402 by Emil Roselie SAUNDERS, RN Outcome: Adequate for Discharge 10/23/2023 1402 by Emil Roselie SAUNDERS, RN Outcome: Adequate for Discharge   Problem: Health Behavior/Discharge Planning: Goal: Ability to identify and utilize available resources and services will improve 10/23/2023 1402 by Emil Roselie SAUNDERS, RN Outcome: Adequate for Discharge 10/23/2023 1402 by Emil Roselie SAUNDERS, RN Outcome: Adequate for Discharge Goal: Ability to manage health-related needs will improve 10/23/2023 1402 by Emil Roselie SAUNDERS, RN Outcome: Adequate for Discharge 10/23/2023 1402 by Emil Roselie SAUNDERS, RN Outcome: Adequate for Discharge   Problem: Metabolic: Goal: Ability to maintain appropriate glucose levels will improve 10/23/2023 1402 by Emil Roselie SAUNDERS, RN Outcome: Adequate for Discharge 10/23/2023 1402 by Emil Roselie SAUNDERS, RN Outcome: Adequate for Discharge   Problem: Nutritional: Goal: Maintenance of adequate nutrition will improve 10/23/2023 1402 by Emil Roselie SAUNDERS, RN Outcome: Adequate for Discharge 10/23/2023 1402 by Emil Roselie SAUNDERS, RN Outcome: Adequate for Discharge Goal: Progress toward achieving an optimal weight will improve 10/23/2023 1402 by Emil Roselie SAUNDERS, RN Outcome: Adequate for Discharge 10/23/2023 1402 by Emil Roselie SAUNDERS, RN Outcome: Adequate for Discharge   Problem: Skin Integrity: Goal: Risk for impaired skin integrity will decrease 10/23/2023 1402 by Emil Roselie SAUNDERS, RN Outcome: Adequate for Discharge 10/23/2023 1402 by Emil Roselie SAUNDERS, RN Outcome: Adequate for Discharge    Problem: Tissue Perfusion: Goal: Adequacy of tissue perfusion will improve 10/23/2023 1402 by Emil Roselie SAUNDERS, RN Outcome: Adequate for Discharge 10/23/2023 1402 by Emil Roselie SAUNDERS, RN Outcome: Adequate for Discharge   Problem: Education: Goal: Understanding of CV disease, CV risk reduction, and recovery process will improve 10/23/2023 1402 by Emil Roselie SAUNDERS, RN Outcome: Adequate for Discharge 10/23/2023 1402 by Emil Roselie SAUNDERS, RN Outcome: Adequate for Discharge Goal: Individualized Educational Video(s) 10/23/2023 1402 by Emil Roselie SAUNDERS, RN Outcome: Adequate for Discharge 10/23/2023 1402 by Emil Roselie SAUNDERS, RN Outcome: Adequate for Discharge   Problem: Activity: Goal: Ability to return to baseline activity level will improve 10/23/2023 1402 by Emil Roselie SAUNDERS, RN Outcome: Adequate for Discharge 10/23/2023 1402 by Emil Roselie SAUNDERS, RN Outcome: Adequate for Discharge   Problem: Cardiovascular: Goal: Ability to achieve and maintain adequate cardiovascular perfusion will improve 10/23/2023 1402 by Emil Roselie SAUNDERS, RN Outcome: Adequate for Discharge 10/23/2023 1402 by Emil Roselie SAUNDERS, RN Outcome: Adequate for Discharge Goal: Vascular access site(s) Level 0-1 will be maintained 10/23/2023 1402 by Emil Roselie SAUNDERS, RN Outcome: Adequate for Discharge 10/23/2023 1402 by Emil Roselie SAUNDERS, RN Outcome: Adequate for Discharge   Problem: Health Behavior/Discharge Planning: Goal: Ability to safely manage health-related needs after discharge will improve 10/23/2023 1402 by Emil Roselie SAUNDERS, RN Outcome: Adequate for Discharge 10/23/2023 1402 by Emil Roselie SAUNDERS, RN Outcome: Adequate for Discharge

## 2023-10-23 NOTE — Plan of Care (Signed)

## 2023-10-23 NOTE — Care Management Important Message (Signed)
 Important Message  Patient Details  Name: Jonathan Cervantes. MRN: [id number] Date of Birth: 12-08-1950   Important Message Given:  Yes - Medicare IM     [name] Sharps 10/23/2023, 10:59 AM

## 2023-10-23 NOTE — Discharge Summary (Addendum)
 Physician Discharge Summary  Jonathan Cervantes. FMW:[id number] DOB: Nov 06, 1950 DOA: 10/19/2023  PCP: Elio Hunter HERO, CRNP  Admit date: 10/19/2023 Discharge date: 10/23/2023  Time spent: 45 minutes  Recommendations for Outpatient Follow-up:  CHF TOC clinic on 7/9   Discharge Diagnoses:  Principal Problem:   Acute systolic heart failure (HCC)  CAD   Essential hypertension   Type 2 diabetes mellitus with hyperlipidemia (HCC)   BPH (benign prostatic hyperplasia) Urinary retention  Discharge Condition: Improved  Diet recommendation: Sodium, heart healthy, diabetic  Filed Weights   10/22/23 0623 10/22/23 1156 10/23/23 0439  Weight: 76.9 kg 76.9 kg 78.4 kg    History of present illness:  73/M w hypertension, BPH, hyperlipidemia, diabetes mellitus type 2 has been experiencing exertional shortness of breath and orthopnea over the last 5 days.ED Course: In the ER chest x-ray shows features concerning for CHF. Troponins were 27 and 29.  BNP was 1500.  Patient was given 40 mg IV Lasix  and admitted for acute CHF.   Due to urinary retention patient Foley catheter was placed   Hospital Course:   Acute systolic CHF, new diagnosis - 2D echo with EF 30-35%, regional wall motion abnormality, grade 1 DD, normal RV - Appreciate cards input, cath noted multivessel CAD with collaterals, LAD disease  - Improved with diuresis, now switched to oral Entresto , Aldactone  - Poor candidate for SGLT2i with urinary retention and required Foley on admission -Add Lasix  PRN at discharge, follow-up arranged in CHF TOC clinic -after DC pt had few episodes of NSVT> Cards suggesting changing Coreg  to Toprol  50mg  daily   CAD -Cath yesterday with multivessel disease, collaterals to left circumflex and RCA, 60% proximal LAD, hemodynamically significant wire FFR - Appreciate cards input, cath yesterday with multivessel CAD with collaterals, LAD disease as well, turndown for CABG on account of small diffusely diseased  vessels - Underwent PTCA and stenting of ostial to mid LAD yesterday - Continue aspirin , Plavix  statin, Toprol  resume   Hypertension uncontrolled  - Improved, meds as above.   Elevated troponin  -Secondary to demand ischemia from CHF, no ACS   Diabetes mellitus type 2  - A1c is 8.1 -SSI for now, poor candidate for SGLT2i - Metformin  at discharge, consider further diet and lifestyle modification   Urinary retention  Foley catheter placed in ED.   - prior history of BPH and used to be on tamsulosin   - Foley catheter removed yesterday, urinating without discomfort, continue Flomax   Discharge Exam: Vitals:   10/23/23 0831 10/23/23 1204  BP: 125/80 112/78  Pulse:  87  Resp: 18 18  Temp: 97.9 F (36.6 C) 97.8 F (36.6 C)  SpO2:  95%    Gen: Awake, Alert, Oriented X 3,  HEENT: no JVD Lungs: Good air movement bilaterally, CTAB CVS: S1S2/RRR Abd: soft, Non tender, non distended, BS present Extremities: No edema Skin: no new rashes on exposed skin   Discharge Instructions   Discharge Instructions     AMB Referral to Cardiac Rehabilitation - Phase II   Complete by: As directed    Diagnosis:  Coronary Stents Heart Failure (see criteria below if ordering Phase II)     Heart Failure Type: Chronic Systolic   After initial evaluation and assessments completed: Virtual Based Care may be provided alone or in conjunction with Phase 2 Cardiac Rehab based on patient barriers.: Yes   Intensive Cardiac Rehabilitation (ICR) [facility] location only OR Traditional Cardiac Rehabilitation (TCR) *If criteria for ICR are not met will enroll  in TCR ([facility] only): Yes   Amb Referral to Cardiac Rehabilitation   Complete by: As directed    Diagnosis: Coronary Stents   After initial evaluation and assessments completed: Virtual Based Care may be provided alone or in conjunction with Phase 2 Cardiac Rehab based on patient barriers.: Yes   Intensive Cardiac Rehabilitation (ICR) [facility] location only OR  Traditional Cardiac Rehabilitation (TCR) *If criteria for ICR are not met will enroll in TCR [facility] only): Yes   Diet - low sodium heart healthy   Complete by: As directed    Diet Carb Modified   Complete by: As directed    Increase activity slowly   Complete by: As directed       Allergies as of 10/23/2023       Reactions   Egg-derived Products Anaphylaxis   Morphine And Codeine Anaphylaxis        Medication List     STOP taking these medications    amLODipine  5 MG tablet Commonly known as: NORVASC        TAKE these medications    albuterol  108 (90 Base) MCG/ACT inhaler Commonly known as: VENTOLIN  HFA Inhale 1-2 puffs into the lungs every 6 (six) hours as needed for wheezing or shortness of breath.   aspirin  EC 81 MG tablet Take 1 tablet (81 mg total) by mouth daily. Swallow whole. Start taking on: October 24, 2023   clopidogrel  75 MG tablet Commonly known as: PLAVIX  Take 1 tablet (75 mg total) by mouth daily. Start taking on: October 24, 2023   furosemide  20 MG tablet Commonly known as: Lasix  Take 1 tablet (20 mg total) by mouth as needed. For increased swelling, weight gain of 3lb in 1 day or 5lb in 1 week   metFORMIN  500 MG 24 hr tablet Commonly known as: GLUCOPHAGE -XR TAKE 2 TABLETS (1,000 MG TOTAL) BY MOUTH 2 (TWO) TIMES DAILY   metoprolol  succinate 50 MG 24 hr tablet Commonly known as: Toprol  XL Take 1 tablet (50 mg total) by mouth daily. Take with or immediately following a meal.   multivitamin with minerals tablet Take 1 tablet by mouth daily.   rosuvastatin  40 MG tablet Commonly known as: CRESTOR  Take 1 tablet (40 mg total) by mouth daily. Start taking on: October 24, 2023   sacubitril -valsartan  24-26 MG Commonly known as: ENTRESTO  Take 1 tablet by mouth 2 (two) times daily.   spironolactone  25 MG tablet Commonly known as: ALDACTONE  Take 0.5 tablets (12.5 mg total) by mouth daily. Start taking on: October 24, 2023   tamsulosin  0.4 MG Caps  capsule Commonly known as: FLOMAX  Take 1 capsule (0.4 mg total) by mouth daily after supper.   vitamin A 25000 UNIT capsule Take 25,000 Units by mouth daily.   vitamin E 180 MG (400 UNITS) capsule Take 400 Units by mouth daily.   VITAMIN K2-VITAMIN D3 PO Take 1 capsule by mouth daily.       Allergies  Allergen Reactions   Egg-Derived Products Anaphylaxis   Morphine And Codeine Anaphylaxis    Follow-up Information     Elio Hunter HERO, CRNP Follow up on 10/21/2023.   Why: office will follow up with you for your follow up apt Contact information: [address] [place] [address] [phone]         [facility]. Go in 6 day(s).   Specialty: Cardiology Why: Hospital follow up 10/28/2023 @ 2:45 pm PLEASE bring a current medication list to appointment FREE  valet parking, Entrance C, [employer] and [employer]. Contact information: [address] [place]  [address] [phone]                 The results of significant diagnostics from this hospitalization (including imaging, microbiology, ancillary and laboratory) are listed below for reference.    Significant Diagnostic Studies: CARDIAC CATHETERIZATION Addendum Date: 10/23/2023 Coronary angioplasty ostial/proximal/mid LAD 10/22/2023: Successful PTCA and stenting of the ostial to mid LAD with implantation of a 2.75 x 32 mm Synergy XD DES postdilated with a 2.75 x 20 mm Sabine balloon at 16 atmospheric pressure.  80% stenosis reduced to 0% with no edge dissection or sidebranch compromise. Cx lesion was wired but realized that there are 2 CTO's of the proximal CTO and CX is severely diffusely diseased and not amenable for revascularization hence lesion left alone. Recommendations: Aspirin  and  Plavix  for 12 months and continued Plavix  indefinitely.  Result Date: 10/23/2023 Images from the original result were not  included. Coronary angioplasty ostial/proximal/mid LAD 10/22/2023: Successful PTCA and stenting of the ostial to mid LAD with implantation of a 2.75 x 32 mm Synergy XD DES postdilated with a 2.75 x 20 mm [place] balloon at 16 atmospheric pressure.  80% stenosis reduced to 0% with no edge dissection or sidebranch compromise. Cx lesion was wired but realized that there are 2 CTO's of the proximal CTO and CX is severely diffusely diseased and not amenable for revascularization hence lesion left alone. Recommendations: Aspirin  until discharge followed by Plavix  for 6 months and continued anticoagulation for PAF.  Indefinitely.   CARDIAC CATHETERIZATION Addendum Date: 10/22/2023 Coronary angiography 10/21/2023: LM: No significant disease LAD: Ostial 30%, prox-mid diffuse 60%, distal focal 60% disease          Grade 3 left-to-right collaterals from septals to RPDA/RPLA, grade 1 left-to-left collaterals to distal OM2          RFR: 0.41 dLAD, 0.59 mLAD with significant step up in prox to mid LAD Lcx: Prox occlusion after bifurcating OM1 with 90% proximal disease RCA: Mid focal 95% stenosis, followed by mid occlusion. Distal RCA/RPDA/RPLA well collateralized from LAD septals          RPDA 90% stenosis LVEDP 17 mmHg Right heart catheterization 10/21/2023: RA: 8 mmHg RV: 31/7 mmHg PA: 30/20 mmHg, mPAP 23 mmHg PCW: 16 mmHg AO sats: 92% PA sats: 64% CO: 4.0 L/min CI: 2.1 L/min/m2 Severe multivessel coronary artery disease Mildly decompensated ischemic cardiomyopathy Mild PH, WHO Grp II While LAD RFR can be affected by significant collateralization to RCA and some to Lcx, step up in RFR is clearly in prox LAD, suggesting that LAD itself may be hemodynamically significant Continue GDMT for HFrEF CVTS consultation for CABG Newman JINNY Lawrence, MD   Addendum Date: 10/22/2023 Coronary angiography 10/21/2023: LM: No significant disease LAD: Ostial 30%, prox-mid diffuse 60%, distal focal 60% disease          Grade 3 left-to-right collaterals  from septals to RPDA/RPLA, grade 1 left-to-left collaterals to distal OM2          RFR: 0.41 dLAD, 0.59 mLAD with significant step up in prox to mid LAD Lcx: Prox occlusion after bifurcating OM1 with 90% proximal disease RCA: Mid focal 95% stenosis, followed by mid occlusion. Distal RCA/RPDA/RPLA well collateralized from LAD septals LVEDP 17 mmHg Right heart catheterization 10/21/2023: RA: 8 mmHg RV: 31/7 mmHg PA: 30/20 mmHg, mPAP 23 mmHg PCW: 16 mmHg AO sats: 92% PA  sats: 64% CO: 4.0 L/min CI: 2.1 L/min/m2 Severe multivessel coronary artery disease Mildly decompensated ischemic cardiomyopathy Mild PH, WHO Grp II While LAD RFR can be affected by significant collateralization to RCA and some to Lcx, step up in RFR is clearly in prox LAD, suggesting that LAD itself may be hemodynamically significant Continue GDMT for HFrEF CVTS consultation for CABG Newman JINNY Lawrence, MD   Result Date: 10/22/2023 Images from the original result were not included. Coronary angiography 10/21/2023: LM: No significant disease LAD: Ostial 30%, prox-mid diffuse 60%, distal focal 60% disease          Grade 3 left-to-right collaterals from septals to LAD, grade 1 left-to-left collaterals to distal OM2          RFR: 0.41 dLAD, 0.59 mLAD with significant step up in prox to mid LAD Lcx: Prox occlusion after bifurcating OM1 with 90% proximal disease RCA: Mid focal 95% stenosis, followed by mid occlusion. Distal RCA/RPDA/RPLA well collateralized from LAD septals LVEDP 17 mmHg Right heart catheterization 10/21/2023: RA: 8 mmHg RV: 31/7 mmHg PA: 30/20 mmHg, mPAP 23 mmHg PCW: 16 mmHg AO sats: 92% PA sats: 64% CO: 4.0 L/min CI: 2.1 L/min/m2 Severe multivessel coronary artery disease Mildly decompensated ischemic cardiomyopathy Mild PH, WHO Grp II While LAD RFR can be affected by significant collateralization to RCA and some to Lcx, step up in RFR is clearly in prox LAD, suggesting that LAD itself may be hemodynamically significant Continue GDMT for HFrEF  CVTS consultation for CABG Newman JINNY Lawrence, MD   ECHOCARDIOGRAM COMPLETE Result Date: 10/20/2023    ECHOCARDIOGRAM REPORT   Patient Name:   Milano Rosevear. Date of Exam: 10/20/2023 Medical Rec #:  [id number]         Height:       70.0 in Accession #:    [id number]        Weight:       172.6 lb Date of Birth:  04-Aug-1950         BSA:          1.961 m Patient Age:    73 years          BP:           134/75 mmHg Patient Gender: M                 HR:           101 bpm. Exam Location:  Inpatient Procedure: 2D Echo, Color Doppler and Cardiac Doppler (Both Spectral and Color            Flow Doppler were utilized during procedure). Indications:    CHF  History:        Patient has no prior history of Echocardiogram examinations.                 CHF; Risk Factors:Hypertension.  Sonographer:    Vella Key Referring Phys: REDIA LOISE CLEAVER IMPRESSIONS  1. Left ventricular ejection fraction, by estimation, is 30 to 35%. The left ventricle has moderately decreased function. The left ventricle demonstrates regional wall motion abnormalities (see scoring diagram/findings for description). The left ventricular internal cavity size was mildly dilated. Left ventricular diastolic parameters are consistent with Grade I diastolic dysfunction (impaired relaxation). There is severe hypokinesis of the left ventricular, entire inferior wall, inferolateral wall and anterolateral wall.  2. Right ventricular systolic function is normal. The right ventricular size is normal. Tricuspid regurgitation signal is inadequate for assessing PA pressure.  3.  The mitral valve is normal in structure. Mild to moderate mitral valve regurgitation. No evidence of mitral stenosis.  4. The aortic valve is tricuspid. Aortic valve regurgitation is not visualized. No aortic stenosis is present.  5. Aortic dilatation noted. There is borderline dilatation of the aortic root, measuring 39 mm.  6. The inferior vena cava is normal in size with greater than 50%  respiratory variability, suggesting right atrial pressure of 3 mmHg. Comparison(s): No prior Echocardiogram. Findings suggest infarction or severe resting ischemia in the territory of the left circumflex and right coronary arteries. FINDINGS  Left Ventricle: No left ventricular thrombus is seen. Left ventricular ejection fraction, by estimation, is 30 to 35%. The left ventricle has moderately decreased function. The left ventricle demonstrates regional wall motion abnormalities. Severe hypokinesis of the left ventricular, entire inferior wall, inferolateral wall and anterolateral wall. The left ventricular internal cavity size was mildly dilated. There is no left ventricular hypertrophy. Left ventricular diastolic parameters are consistent with Grade I diastolic dysfunction (impaired relaxation). Indeterminate filling pressures.  LV Wall Scoring: The antero-lateral wall, entire inferior wall, and posterior wall are hypokinetic. The entire anterior wall, entire septum, apical lateral segment, and apex are normal. Right Ventricle: The right ventricular size is normal. No increase in right ventricular wall thickness. Right ventricular systolic function is normal. Tricuspid regurgitation signal is inadequate for assessing PA pressure. Left Atrium: Left atrial size was normal in size. Right Atrium: Right atrial size was normal in size. Pericardium: There is no evidence of pericardial effusion. Mitral Valve: The mitral valve is normal in structure. Mild to moderate mitral valve regurgitation, with centrally-directed jet. No evidence of mitral valve stenosis. Tricuspid Valve: The tricuspid valve is normal in structure. Tricuspid valve regurgitation is not demonstrated. Aortic Valve: The aortic valve is tricuspid. Aortic valve regurgitation is not visualized. No aortic stenosis is present. Pulmonic Valve: The pulmonic valve was not well visualized. Pulmonic valve regurgitation is not visualized. No evidence of pulmonic  stenosis. Aorta: Aortic dilatation noted. There is borderline dilatation of the aortic root, measuring 39 mm. Venous: The inferior vena cava is normal in size with greater than 50% respiratory variability, suggesting right atrial pressure of 3 mmHg. IAS/Shunts: No atrial level shunt detected by color flow Doppler.  LEFT VENTRICLE PLAX 2D LVIDd:         5.70 cm      Diastology LVIDs:         5.20 cm      LV e' medial:    5.44 cm/s LV PW:         0.80 cm      LV E/e' medial:  15.7 LV IVS:        1.00 cm      LV e' lateral:   6.20 cm/s LVOT diam:     2.07 cm      LV E/e' lateral: 13.8 LV SV:         51 LV SV Index:   26 LVOT Area:     3.37 cm  LV Volumes (MOD) LV vol d, MOD A2C: 194.0 ml LV vol d, MOD A4C: 170.0 ml LV vol s, MOD A2C: 131.0 ml LV vol s, MOD A4C: 127.0 ml LV SV MOD A2C:     63.0 ml LV SV MOD A4C:     170.0 ml LV SV MOD BP:      54.4 ml RIGHT VENTRICLE RV Basal diam:  2.60 cm RV S prime:     8.27 cm/s  TAPSE (M-mode): 1.5 cm LEFT ATRIUM             Index        RIGHT ATRIUM          Index LA diam:        3.50 cm 1.79 cm/m   RA Area:     8.76 cm LA Vol (A2C):   28.1 ml 14.33 ml/m  RA Volume:   15.30 ml 7.80 ml/m LA Vol (A4C):   25.0 ml 12.75 ml/m LA Biplane Vol: 28.4 ml 14.49 ml/m  AORTIC VALVE LVOT Vmax:   90.20 cm/s LVOT Vmean:  62.800 cm/s LVOT VTI:    0.152 m  AORTA Ao Root diam: 3.90 cm MITRAL VALVE MV Area (PHT): 7.51 cm     SHUNTS MV Decel Time: 101 msec     Systemic VTI:  0.15 m MV E velocity: 85.40 cm/s   Systemic Diam: 2.07 cm MV A velocity: 121.00 cm/s MV E/A ratio:  0.71 Mihai Croitoru MD Electronically signed by Jerel Balding MD Signature Date/Time: 10/20/2023/4:22:39 PM    Final    DG Chest 2 View Result Date: 10/19/2023 CLINICAL DATA:  Shortness of breath, cough and congestion. EXAM: CHEST - 2 VIEW COMPARISON:  02/02/2018 and CT chest 01/30/2021. FINDINGS: Trachea is midline. Heart is enlarged, stable. Basilar dependent mixed interstitial and airspace opacification with small  bilateral pleural effusions. Scattered subsegmental atelectasis. IMPRESSION: Congestive heart failure. Electronically Signed   By: Newell Eke M.D.   On: 10/19/2023 13:01    Microbiology: Recent Results (from the past 240 hours)  Resp panel by RT-PCR (RSV, Flu A&B, Covid) Anterior Nasal Swab     Status: None   Collection Time: 10/19/23 12:39 PM   Specimen: Anterior Nasal Swab  Result Value Ref Range Status   SARS Coronavirus 2 by RT PCR NEGATIVE NEGATIVE Final    Comment: (NOTE) SARS-CoV-2 target nucleic acids are NOT DETECTED.  The SARS-CoV-2 RNA is generally detectable in upper respiratory specimens during the acute phase of infection. The lowest concentration of SARS-CoV-2 viral copies this assay can detect is 138 copies/mL. A negative result does not preclude SARS-Cov-2 infection and should not be used as the sole basis for treatment or other patient management decisions. A negative result may occur with  improper specimen collection/handling, submission of specimen other than nasopharyngeal swab, presence of viral mutation(s) within the areas targeted by this assay, and inadequate number of viral copies(<138 copies/mL). A negative result must be combined with clinical observations, patient history, and epidemiological information. The expected result is Negative.  Fact Sheet for Patients:  BloggerCourse.com  Fact Sheet for Healthcare Providers:  SeriousBroker.it  This test is no t yet approved or cleared by the [place]  FDA and  has been authorized for detection and/or diagnosis of SARS-CoV-2 by FDA under an Emergency Use Authorization (EUA). This EUA will remain  in effect (meaning this test can be used) for the duration of the COVID-19 declaration under Section 564(b)(1) of the Act, 21 U.S.C.section 360bbb-3(b)(1), unless the authorization is terminated  or revoked sooner.       Influenza A by PCR NEGATIVE  NEGATIVE Final   Influenza B by PCR NEGATIVE NEGATIVE Final    Comment: (NOTE) The Xpert Xpress SARS-CoV-2/FLU/RSV plus assay is intended as an aid in the diagnosis of influenza from Nasopharyngeal swab specimens and should not be used as a sole basis for treatment. Nasal washings and aspirates are unacceptable for Xpert Xpress SARS-CoV-2/FLU/RSV testing.  Fact Sheet  for Patients: BloggerCourse.com  Fact Sheet for Healthcare Providers: SeriousBroker.it  This test is not yet approved or cleared by the [place]  FDA and has been authorized for detection and/or diagnosis of SARS-CoV-2 by FDA under an Emergency Use Authorization (EUA). This EUA will remain in effect (meaning this test can be used) for the duration of the COVID-19 declaration under Section 564(b)(1) of the Act, 21 U.S.C. section 360bbb-3(b)(1), unless the authorization is terminated or revoked.     Resp Syncytial Virus by PCR NEGATIVE NEGATIVE Final    Comment: (NOTE) Fact Sheet for Patients: BloggerCourse.com  Fact Sheet for Healthcare Providers: SeriousBroker.it  This test is not yet approved or cleared by the [place]  FDA and has been authorized for detection and/or diagnosis of SARS-CoV-2 by FDA under an Emergency Use Authorization (EUA). This EUA will remain in effect (meaning this test can be used) for the duration of the COVID-19 declaration under Section 564(b)(1) of the Act, 21 U.S.C. section 360bbb-3(b)(1), unless the authorization is terminated or revoked.  Performed at [facility], [address] [place], [place] [address]      Labs: Basic Metabolic Panel: Recent Labs  Lab 10/19/23 1239 10/19/23 2127 10/20/23 0145 10/21/23 0240 10/21/23 1312 10/21/23 1316 10/21/23 1454 10/22/23 0234 10/23/23 0414 10/23/23 1146  NA 140  --  137 136 137 137  137  --  136 136  --   K  4.1  --  4.1 3.5 3.3* 3.3*  3.3*  --  3.4* 3.9  --   CL 104  --  103 99  --   --   --  98 102  --   CO2 24  --  23 26  --   --   --  26 28  --   GLUCOSE 287*  --  343* 278*  --   --   --  209* 108*  --   BUN 18  --  17 24*  --   --   --  31* 24*  --   CREATININE 1.02   < > 1.15 1.23  --   --  1.17 1.34* 1.18  --   CALCIUM  9.2  --  9.3 9.1  --   --   --  9.0 8.5*  --   MG  --   --  2.0 2.0  --   --   --   --   --  2.2   < > = values in this interval not displayed.   Liver Function Tests: Recent Labs  Lab 10/19/23 1239  AST 18  ALT 16  ALKPHOS 73  BILITOT 0.7  PROT 6.8  ALBUMIN 4.3   No results for input(s): LIPASE, AMYLASE in the last 168 hours. No results for input(s): AMMONIA in the last 168 hours. CBC: Recent Labs  Lab 10/19/23 1239 10/19/23 2127 10/20/23 0145 10/21/23 1312 10/21/23 1316 10/21/23 1518 10/23/23 0414  WBC 6.7 9.0 8.2  --   --  8.9 8.0  HGB 16.5 16.5 16.7 15.3 15.3  15.6 16.7 15.0  HCT 47.9 47.9 48.7 45.0 45.0  46.0 48.4 43.7  MCV 86.9 86.2 87.3  --   --  86.0 87.1  PLT 204 225 220  --   --  243 216   Cardiac Enzymes: No results for input(s): CKTOTAL, CKMB, CKMBINDEX, TROPONINI in the last 168 hours. BNP: BNP (last 3 results) No results for input(s): BNP in the last 8760 hours.  ProBNP (last 3  results) Recent Labs    10/19/23 1239  PROBNP 1,597.0*    CBG: Recent Labs  Lab 10/22/23 1423 10/22/23 1659 10/22/23 2135 10/23/23 0834 10/23/23 1207  GLUCAP 161* 325* 214* 151* 281*       Signed:  Sigurd Pac MD.  [facility] 10/23/2023, 12:49 PM

## 2023-10-23 NOTE — Progress Notes (Addendum)
 [name]. Date of Encounter: 10/23/2023 [facility] Cardiologist: Lonni Cash, MD   Interval Summary  .    Doing well No chest pain. Shortness of breath  Vital Signs .    Vitals:   10/22/23 1656 10/22/23 1955 10/22/23 2348 10/23/23 0439  BP: 112/69 128/83 128/73 122/78  Pulse: 97 93 95 84  Resp: 18 18 18 18   Temp: 98.6 F (37 C) 98.7 F (37.1 C) 98.5 F (36.9 C) 98.7 F (37.1 C)  TempSrc: Oral Oral Oral Oral  SpO2: 97% 96% 92% 96%  Weight:    78.4 kg  Height:        Intake/Output Summary (Last 24 hours) at 10/23/2023 0751 Last data filed at 10/23/2023 0441 Gross per 24 hour  Intake 720 ml  Output 350 ml  Net 370 ml      10/23/2023    4:39 AM 10/22/2023   11:56 AM 10/22/2023    6:23 AM  Last 3 Weights  Weight (lbs) 172 lb 12.8 oz 169 lb 8.5 oz 169 lb 8 oz  Weight (kg) 78.382 kg 76.9 kg 76.885 kg      Telemetry/ECG    10/23/2023 - Personally Reviewed Sinus rhythm, no Afib  Coronary angioplasty ostial/proximal/mid LAD 10/22/2023: Successful PTCA and stenting of the ostial to mid LAD with implantation of a 2.75 x 32 mm Synergy XD DES postdilated with a 2.75 x 20 mm [place] balloon at 16 atmospheric pressure.  80% stenosis reduced to 0% with no edge dissection or sidebranch compromise.   Cx lesion was wired but realized that there are 2 CTO's of the proximal CTO and CX is severely diffusely diseased and not amenable for revascularization hence lesion left alone.      Recommendations: Aspirin  until discharge followed by Plavix  for 6 months and continued anticoagulation for PAF.  Indefinitely.  Coronary angiography 10/21/2023: LM: No significant disease LAD: Ostial 30%, prox-mid diffuse 60%, distal focal 60% disease          Grade 3 left-to-right collaterals from septals to RPDA/RPLA, grade 1 left-to-left collaterals to distal OM2          RFR: 0.41 dLAD, 0.59 mLAD with significant step up in prox to mid LAD  Lcx: Prox occlusion after  bifurcating OM1 with 90% proximal disease RCA: Mid focal 95% stenosis, followed by mid occlusion. Distal RCA/RPDA/RPLA well collateralized from LAD septals          RPDA 90% stenosis   LVEDP 17 mmHg      Right heart catheterization 10/21/2023: RA: 8 mmHg RV: 31/7 mmHg PA: 30/20 mmHg, mPAP 23 mmHg PCW: 16 mmHg   AO sats: 92% PA sats: 64%   CO: 4.0 L/min CI: 2.1 L/min/m2   Severe multivessel coronary artery disease Mildly decompensated ischemic cardiomyopathy Mild PH, WHO Grp II   Echocardiogram 10/20/2023: 1. Left ventricular ejection fraction, by estimation, is 30 to 35%. The  left ventricle has moderately decreased function. The left ventricle  demonstrates regional wall motion abnormalities (see scoring  diagram/findings for description). The left  ventricular internal cavity size was mildly dilated. Left ventricular  diastolic parameters are consistent with Grade I diastolic dysfunction  (impaired relaxation). There is severe hypokinesis of the left  ventricular, entire inferior wall, inferolateral  wall and anterolateral wall.   2. Right ventricular systolic function is normal. The right ventricular  size is normal. Tricuspid regurgitation signal is inadequate for assessing  PA pressure.   3. The mitral valve  is normal in structure. Mild to moderate mitral valve  regurgitation. No evidence of mitral stenosis.   4. The aortic valve is tricuspid. Aortic valve regurgitation is not  visualized. No aortic stenosis is present.   5. Aortic dilatation noted. There is borderline dilatation of the aortic  root, measuring 39 mm.   6. The inferior vena cava is normal in size with greater than 50%  respiratory variability, suggesting right atrial pressure of 3 mmHg.   Comparison(s): No prior Echocardiogram. Findings suggest infarction or  severe resting ischemia in the territory of the left circumflex and right  coronary arteries.    Physical Exam .   Physical Exam Vitals and  nursing note reviewed.  Constitutional:      General: He is not in acute distress. Neck:     Vascular: No JVD.  Cardiovascular:     Rate and Rhythm: Normal rate and regular rhythm.     Heart sounds: Normal heart sounds. No murmur heard. Pulmonary:     Effort: Pulmonary effort is normal.     Breath sounds: Normal breath sounds. No wheezing or rales.  Musculoskeletal:     Right lower leg: No edema.     Left lower leg: No edema.      Assessment & Plan .     73 year old male with hypertension, hyperlipidemia, diabetes mellitus, new diagnosis of systolic heart failure   HFrEF, CAD: Ischemic cardiomyopathy Severe multivessel CAD, deemed nto to be a surgical candidate due to poor tarets S/p prox LAD PCI 7/3 No bleeding contraindications, recommend 6 months DAPT with Aspirin  and Plavix . After that, I would prefer Plavix  monotherapy. I confirmed with EKG, telemetry and talking to primary team. He has had no afib, so no anticoagulation needed.  Continue coreg  6.25 mg bid, entresto  24-26 mg bid, spironolactone  12.5 mg daily. He has had cough with lisinopril and losartan , so need to see if he would tolerate Entresto  ot not. Not on SGLT2i due h/o urinary retention and incontinence. Looks euvolumic, does not need lasix  daily. Okay to use 20 mg prn.  LDL 145 on presentation, statin naive. Adding rosuvastatin  40 mg daily. Lipid panel check in 4 weeks. If LDL remains >55, will benefit from injectable agents. Will need GDMT uptitration for HFrEF at [facility] visit.  Diabetes mellitus: A1C 8.1%, Goal <7%. Management as per primary team.   Hypertension: Controlled  Mixed hyperlipidemia: As above  Okay to discharge home today. Will arrange 2 week TOC f/u.     For questions or updates, please contact [facility] Please consult [email or web address] for contact info under        Signed, Newman JINNY Lawrence, MD

## 2023-10-24 ENCOUNTER — Encounter (HOSPITAL_COMMUNITY): Payer: Self-pay | Admitting: Cardiology

## 2023-10-24 LAB — LIPOPROTEIN A (LPA): Lipoprotein (a): 135.3 nmol/L — ABNORMAL HIGH (ref <75.0)

## 2023-10-27 ENCOUNTER — Telehealth: Payer: Self-pay

## 2023-10-27 NOTE — Telephone Encounter (Signed)
-----   Message from [facility] sent at 10/23/2023  8:32 AM EDT ----- Regarding: TOC appt Ischemic cardiomyopathy, not ACS but statin naiive, LDL 145, diabetic. Needs 2 week TOC appt (before 7/18) Will need order for lipid panel then to be checked at end of July. If LDL >55, will need referral to lipid clinic for injectable agents Will need uptitration of GDMT at [facility] visit  Thanks MJP

## 2023-10-28 ENCOUNTER — Encounter (HOSPITAL_COMMUNITY): Payer: Self-pay

## 2023-10-28 ENCOUNTER — Ambulatory Visit (HOSPITAL_COMMUNITY): Payer: Self-pay | Admitting: Adult Health

## 2023-10-28 ENCOUNTER — Ambulatory Visit (HOSPITAL_COMMUNITY)
Admit: 2023-10-28 | Discharge: 2023-10-28 | Disposition: A | Discharge status: Home / Self Care | Source: Ambulatory Visit | Attending: Adult Health | Admitting: Adult Health

## 2023-10-28 VITALS — BP 128/78 | HR 97 | Ht 70.0 in | Wt 177.2 lb

## 2023-10-28 DIAGNOSIS — R350 Frequency of micturition: Secondary | ICD-10-CM | POA: Insufficient documentation

## 2023-10-28 DIAGNOSIS — Z955 Presence of coronary angioplasty implant and graft: Secondary | ICD-10-CM | POA: Insufficient documentation

## 2023-10-28 DIAGNOSIS — E785 Hyperlipidemia, unspecified: Secondary | ICD-10-CM | POA: Insufficient documentation

## 2023-10-28 DIAGNOSIS — I255 Ischemic cardiomyopathy: Secondary | ICD-10-CM | POA: Insufficient documentation

## 2023-10-28 DIAGNOSIS — Z7984 Long term (current) use of oral hypoglycemic drugs: Secondary | ICD-10-CM | POA: Diagnosis not present

## 2023-10-28 DIAGNOSIS — E1169 Type 2 diabetes mellitus with other specified complication: Secondary | ICD-10-CM

## 2023-10-28 DIAGNOSIS — Z79899 Other long term (current) drug therapy: Secondary | ICD-10-CM | POA: Diagnosis not present

## 2023-10-28 DIAGNOSIS — I5022 Chronic systolic (congestive) heart failure: Secondary | ICD-10-CM | POA: Insufficient documentation

## 2023-10-28 DIAGNOSIS — I10 Essential (primary) hypertension: Secondary | ICD-10-CM

## 2023-10-28 DIAGNOSIS — Z7902 Long term (current) use of antithrombotics/antiplatelets: Secondary | ICD-10-CM | POA: Diagnosis not present

## 2023-10-28 DIAGNOSIS — E119 Type 2 diabetes mellitus without complications: Secondary | ICD-10-CM | POA: Diagnosis not present

## 2023-10-28 DIAGNOSIS — I110 Hypertensive heart disease with heart failure: Secondary | ICD-10-CM | POA: Diagnosis not present

## 2023-10-28 DIAGNOSIS — Z7982 Long term (current) use of aspirin: Secondary | ICD-10-CM | POA: Insufficient documentation

## 2023-10-28 DIAGNOSIS — I2510 Atherosclerotic heart disease of native coronary artery without angina pectoris: Secondary | ICD-10-CM | POA: Diagnosis not present

## 2023-10-28 LAB — BASIC METABOLIC PANEL WITH GFR
Anion gap: 12 (ref 5–15)
BUN: 19 mg/dL (ref 8–23)
CO2: 24 mmol/L (ref 22–32)
Calcium: 9.4 mg/dL (ref 8.9–10.3)
Chloride: 100 mmol/L (ref 98–111)
Creatinine, Ser: 1.23 mg/dL (ref 0.61–1.24)
GFR, Estimated: >60 mL/min (ref >60)
Glucose, Bld: 246 mg/dL — ABNORMAL HIGH (ref 70–99)
Potassium: 4.5 mmol/L (ref 3.5–5.1)
Sodium: 136 mmol/L (ref 135–145)

## 2023-10-28 LAB — CBC
HCT: 46.3 % (ref 39.0–52.0)
Hemoglobin: 16 g/dL (ref 13.0–17.0)
MCH: 30 pg (ref 26.0–34.0)
MCHC: 34.6 g/dL (ref 30.0–36.0)
MCV: 86.9 fL (ref 80.0–100.0)
Platelets: 229 K/uL (ref 150–400)
RBC: 5.33 MIL/uL (ref 4.22–5.81)
RDW: 12.5 % (ref 11.5–15.5)
WBC: 6.8 K/uL (ref 4.0–10.5)
nRBC: 0 % (ref 0.0–0.2)

## 2023-10-28 NOTE — Patient Instructions (Signed)
 Than you for your visit today.  Labs done today, your results will be available in MyChart, we will contact you for abnormal readings.  Thank you for allowing us  to provider your heart failure care after your recent hospitalization. Please follow-up with Dr. Susy as scheduled.  At the [facility], you and your health needs are our priority. As part of our continuing mission to provide you with exceptional heart care, we have created designated Provider Care Teams. These Care Teams include your primary Cardiologist (physician) and Advanced Practice Providers (APPs- Physician Assistants and Nurse Practitioners) who all work together to provide you with the care you need, when you need it.   You may see any of the following providers on your designated Care Team at your next follow up: Dr Toribio Fuel Dr Ezra Shuck Dr. Ria Commander Dr. Morene Brownie Amy Lenetta, NP Caffie Shed, [place] [facility] [place], [place] Beckey Coe, NP [place] Lee, NP Ellouise Class, NP Tinnie Redman, PharmD Jaun Bash, PharmD   Please be sure to bring in all your medications bottles to every appointment.    Thank you for choosing [facility]
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
## 2023-10-28 NOTE — Telephone Encounter (Signed)
 Left message to call back

## 2023-10-29 NOTE — Telephone Encounter (Signed)
 Please call and instruct to hold evening dose to of Toprol  XL.   Tomorrow I would like him to cut back the dose of Toprol  XL to 50 mg at bed time.   If heart rate remains low, please call back.   Goodwin Kamphaus NP-C  1:29 PM

## 2023-10-29 NOTE — Telephone Encounter (Signed)
 Pt takes Toprol  once daily, in the morning.  He took his morning dose today.  He will hold tomorrow's dose and start taking 50 mg at bedtime on Saturday.  He will call office if HRs remain low/symptomatic.

## 2023-10-29 NOTE — Telephone Encounter (Signed)
 Spoke to patient  Reviewed Patwardhan's message with pt.   Rescheduled TOC appt to next week, 7/15 with Damien Braver.  (was scheduled for 8/7)  Aware they will address lipid panel at next weeks appt. (Informed that it will need to be drawn end of this month)   Pt reporting some low HRs during the day, HRs 30-40s, associated with dizziness.  He states that when HRs are low it makes him feel 'yucky'.   BP avg 122/60-70s, but occasionally dropping into teens.  Example given was 116/58. Aware forwarding to MD for advisement.... possibly reduce/change Toprol 

## 2023-10-29 NOTE — Telephone Encounter (Signed)
 Thank you :)

## 2023-10-29 NOTE — Telephone Encounter (Signed)
 Patient returned RN's call.

## 2023-11-03 ENCOUNTER — Encounter: Payer: Self-pay | Admitting: Nurse Practitioner

## 2023-11-03 ENCOUNTER — Ambulatory Visit: Attending: Cardiology | Admitting: Nurse Practitioner

## 2023-11-03 ENCOUNTER — Telehealth (HOSPITAL_COMMUNITY): Payer: Self-pay

## 2023-11-03 VITALS — BP 126/78 | HR 86 | Ht 70.0 in | Wt 175.6 lb

## 2023-11-03 DIAGNOSIS — I5022 Chronic systolic (congestive) heart failure: Secondary | ICD-10-CM | POA: Diagnosis not present

## 2023-11-03 DIAGNOSIS — I10 Essential (primary) hypertension: Secondary | ICD-10-CM

## 2023-11-03 DIAGNOSIS — I255 Ischemic cardiomyopathy: Secondary | ICD-10-CM | POA: Diagnosis not present

## 2023-11-03 DIAGNOSIS — I2510 Atherosclerotic heart disease of native coronary artery without angina pectoris: Secondary | ICD-10-CM | POA: Diagnosis not present

## 2023-11-03 DIAGNOSIS — E785 Hyperlipidemia, unspecified: Secondary | ICD-10-CM

## 2023-11-03 DIAGNOSIS — E119 Type 2 diabetes mellitus without complications: Secondary | ICD-10-CM

## 2023-11-03 MED ORDER — EMPAGLIFLOZIN 10 MG PO TABS: 10.0000 mg | ORAL_TABLET | Freq: Every day | ORAL | 3 refills | Status: AC

## 2023-11-03 NOTE — Patient Instructions (Signed)
 Medication Instructions:  Start Jardiance  10 mg daily  *If you need a refill on your cardiac medications before your next appointment, please call your pharmacy*  Lab Work: BMET in 2 weeks Fasting lipid panel & CMET in 6-8 weeks  Testing/Procedures: Your physician has requested that you have a limited  echocardiogram. Echocardiography is a painless test that uses sound waves to create images of your heart. It provides your doctor with information about the size and shape of your heart and how well your heart's chambers and valves are working. This procedure takes approximately one hour. There are no restrictions for this procedure. Please do NOT wear cologne, perfume, aftershave, or lotions (deodorant is allowed). Please arrive 15 minutes prior to your appointment time.  Please note: We ask at that you not bring children with you during ultrasound (echo/ vascular) testing. Due to room size and safety concerns, children are not allowed in the ultrasound rooms during exams. Our front office staff cannot provide observation of children in our lobby area while testing is being conducted. An adult accompanying a patient to their appointment will only be allowed in the ultrasound room at the discretion of the ultrasound technician under special circumstances. We apologize for any inconvenience.   Follow-Up: At [facility], you and your health needs are our priority.  As part of our continuing mission to provide you with exceptional heart care, our providers are all part of one team.  This team includes your primary Cardiologist (physician) and Advanced Practice Providers or APPs (Physician Assistants and Nurse Practitioners) who all work together to provide you with the care you need, when you need it.  Your next appointment:   3-4 month(s)  Provider:   Lonni Cash, MD or Damien Braver, NP          We recommend signing up for the patient portal called MyChart.  Sign up  information is provided on this After Visit Summary.  MyChart is used to connect with patients for Virtual Visits (Telemedicine).  Patients are able to view lab/test results, encounter notes, upcoming appointments, etc.  Non-urgent messages can be sent to your provider as well.   To learn more about what you can do with MyChart, go to [email or web address].
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
## 2023-11-03 NOTE — Telephone Encounter (Signed)
 Referral recv'd and verified for MD signature. Follow up appointment is on 7/15. Insurance benefits and eligibility TBD.   Attempted to call patient regarding interest in cardiac rehab- no answer, left message to call us  back. Sent MyChart message.

## 2023-11-04 ENCOUNTER — Encounter: Payer: Self-pay | Admitting: Nurse Practitioner

## 2023-11-06 ENCOUNTER — Encounter: Payer: Self-pay | Admitting: Cardiovascular Disease

## 2023-11-06 ENCOUNTER — Ambulatory Visit

## 2023-11-06 ENCOUNTER — Telehealth: Payer: Self-pay | Admitting: Cardiovascular Disease

## 2023-11-06 ENCOUNTER — Ambulatory Visit: Attending: Cardiovascular Disease | Admitting: Cardiovascular Disease

## 2023-11-06 VITALS — BP 128/68 | HR 82 | Ht 70.0 in | Wt 173.8 lb

## 2023-11-06 DIAGNOSIS — R002 Palpitations: Secondary | ICD-10-CM | POA: Diagnosis not present

## 2023-11-06 DIAGNOSIS — I499 Cardiac arrhythmia, unspecified: Secondary | ICD-10-CM

## 2023-11-06 NOTE — Progress Notes (Unsigned)
 Enrolled for [employer] to mail a ZIO XT long term holter monitor to the patients address on file.

## 2023-11-06 NOTE — Telephone Encounter (Signed)
 Call transferred directly to triage.  Patient reports he did not sleep well last night, woke up this morning with heart rate of 26. He states he is unable to get his BP cuff to register a BP for him despite machine having fresh batteries. He is using pulse ox to check HR, it is ranging from 41-101. Patient states he can feel palpitations. He denies any SOB, CP, or dizziness.  He reports yesterday he had a HR of 41 and felt uneasy.  Patient notes he started Jardiance  Wednesday, and took his second dose yesterday.   Scheduled patient to see Dr. Francyne (DOD) this morning, patient states he will be here. Will plan to have EKG performed at visit today. Notified Dr. Fanny nurse of add-on.

## 2023-11-06 NOTE — Telephone Encounter (Signed)
 STAT if HR is under 50 or over 120  (normal HR is 60-100 beats per minute)  What is your heart rate? 41  Do you have a log of your heart rate readings (document readings)? 26/45/27/41  Do you have any other symptoms? No

## 2023-11-06 NOTE — Patient Instructions (Signed)
 Medication Instructions:  No changes *If you need a refill on your cardiac medications before your next appointment, please call your pharmacy*  Lab Work: None ordered If you have labs (blood work) drawn today and your tests are completely normal, you will receive your results only by: MyChart Message (if you have MyChart) OR A paper copy in the mail If you have any lab test that is abnormal or we need to change your treatment, we will call you to review the results.  Testing/Procedures: Your physician has recommended that you wear a 7 DAY ZIO-PATCH monitor. The Zio patch cardiac monitor continuously records heart rhythm data for up to 14 days, this is for patients being evaluated for multiple types heart rhythms. For the first 24 hours post application, please avoid getting the Zio monitor wet in the shower or by excessive sweating during exercise. After that, feel free to carry on with regular activities. Keep soaps and lotions away from the ZIO XT Patch.  This will be mailed to you, please expect 7-10 days to receive.    Applying the monitor   Shave hair from upper left chest.   Hold abrader disc by orange tab.  Rub abrader in 40 strokes over left upper chest as indicated in your monitor instructions.   Clean area with 4 enclosed alcohol pads .  Use all pads to assure are is cleaned thoroughly.  Let dry.   Apply patch as indicated in monitor instructions.  Patch will be place under collarbone on left side of chest with arrow pointing upward.   Rub patch adhesive wings for 2 minutes.Remove white label marked 1.  Remove white label marked 2.  Rub patch adhesive wings for 2 additional minutes.   While looking in a mirror, press and release button in center of patch.  A small green light will flash 3-4 times .  This will be your only indicator the monitor has been turned on.     Do not shower for the first 24 hours.  You may shower after the first 24 hours.   Press button if you  feel a symptom. You will hear a small click.  Record Date, Time and Symptom in the Patient Log Book.   When you are ready to remove patch, follow instructions on last 2 pages of Patient Log Book.  Stick patch monitor onto last page of Patient Log Book.   Place Patient Log Book in [place] box.  Use locking tab on box and tape box closed securely.  The Orange and [employer] has [employer] on it.  Please place in mailbox as soon as possible.  Your physician should have your test results approximately 7 days after the monitor has been mailed back to [facility].   Call [facility] Customer Care at [phone] if you have questions regarding your ZIO XT patch monitor.  Call them immediately if you see an orange light blinking on your monitor.   If your monitor falls off in less than 4 days contact our Monitor department at [phone].  If your monitor becomes loose or falls off after 4 days call [employer] at [phone] for suggestions on securing your monitor   Follow-Up: At [facility], you and your health needs are our priority.  As part of our continuing mission to provide you with exceptional heart care, our providers are all part of one team.  This team includes your primary Cardiologist (physician) and Advanced Practice Providers or APPs (Physician Assistants and Nurse Practitioners)  who all work together to provide you with the care you need, when you need it.  Your next appointment:    Follow up at regularly scheduled appointments  Provider:   Lonni Cash, MD    We recommend signing up for the patient portal called MyChart.  Sign up information is provided on this After Visit Summary.  MyChart is used to connect with patients for Virtual Visits (Telemedicine).  Patients are able to view lab/test results, encounter notes, upcoming appointments, etc.  Non-urgent messages can be sent to your provider as well.   To learn more about what you can do with MyChart, go  to [email or web address].

## 2023-11-15 NOTE — Progress Notes (Signed)
 Cardiology Office Note   Date:  11/15/2023  ID:  Nadia Torr., DOB 11/13/1950, MRN [id number] PCP: Elio Hunter HERO, CRNP  [facility] Providers Cardiologist:  Lonni Cash, MD     History of Present Illness Jonathan Cervantes. is a 73 y.o. male with history of CAD (PCI-DES to LAD), chronic systolic and diastolic heart failure, HTN, HLP, DM type II who was hospitalized 06/30 - 07/04 for acute exacerbation of heart failure.  LVEF most recently estimated at 30-35% with inferior and lateral wall hypokinesis and mild-moderate MR.  He underwent coronary angiography which showed severe multivessel CAD but he was felt to be a poor candidate for CABG due to poor distal vessel targets.  The circumflex coronary artery had consecutive chronic total occlusions and was not amenable to PCI but he received a stent to the mid LAD 10/22/2023.  He was started on dual antiplatelet therapy and comprehensive medical therapy for heart failure including Entresto , spironolactone  and metoprolol  succinate with the Jardiance  added a few days ago.  There is a plan for repeat echocardiography in a few months, after his heart failure medications have been optimized.  He presents today with complaints of an irregular palpitations and very low pulse rate.  He reports that his blood pressure cuff reported error and his pulse oximeter showed a heart rate of only 26 bpm this morning.  He has poor sleep and has been feeling rather yucky ever since his stent and discharged home.  Despite this he has been walking about half a mile a day.  He has not had angina or dyspnea, lower extremity edema, orthopnea, PND or focal neurological events.  He has not had dizziness or syncope.  His ECG shows a relatively frequent PVCs on a background sinus rhythm at around 80 bpm.    Studies Reviewed EKG Interpretation Date/Time:  Friday November 06 2023 10:02:31 EDT Ventricular Rate:  82 PR Interval:  138 QRS Duration:  152 QT  Interval:  412 QTC Calculation: 481 R Axis:   132  Text Interpretation: Sinus rhythm with occasional Premature ventricular complexes Right bundle branch block When compared with ECG of 03-Nov-2023 10:59, Premature ventricular complexes are now Present Confirmed by Azhar Knope [id number]) on 11/06/2023 10:13:21 AM     Risk Assessment/Calculations        Physical Exam VS:  BP 128/68   Pulse 82   Ht 5' 10 (1.778 m)   Wt 173 lb 12.8 oz (78.8 kg)   SpO2 97%   BMI 24.94 kg/m        Wt Readings from Last 3 Encounters:  11/06/23 173 lb 12.8 oz (78.8 kg)  11/03/23 175 lb 9.6 oz (79.7 kg)  10/28/23 177 lb 3.2 oz (80.4 kg)    GEN: Well nourished, well developed in no acute distress NECK: No JVD; No carotid bruits CARDIAC: Normal S1, widely split S2, frequent ectopy on a background of RRR, no murmurs, rubs, gallops RESPIRATORY:  Clear to auscultation without rales, wheezing or rhonchi  ABDOMEN: Soft, non-tender, non-distended EXTREMITIES:  No edema; No deformity   ASSESSMENT AND PLAN CHF: He appears to be clinically euvolemic and has been prescribed furosemide  only on an as-needed basis.  He is on comprehensive guideline directed medical therapy for heart failure, with a most recent adjustment performed just 3 days ago.  No change in medications for heart failure recommended today. CAD: He has not had angina pectoris since hospital discharge although he is walking on a daily basis.  Frequent PVCs: Suspect that these are causing artifactual bradycardia.  We discussed the mechanism for pulse deficit when the rhythm is irregular.  The ineffective PVCs may be causing some degree of fatigue, but otherwise the PVCs appear to be largely asymptomatic.  Will have him wear a monitor to quantify the burden of PVCs and look for any evidence of more complex arrhythmia.  We very briefly discussed primary prevention indications for defibrillator implantation and the fact that we generally wait for 90 days  after revascularization before making a recommendation on this.  Continue metoprolol  succinate. HTN: In optimal range on current medications.  There is a little bit of room to further optimize the doses of Entresto  and / or metoprolol .  If his monitor shows a high burden of PVCs I would favor increasing the beta-blocker first. HLP/DM: He has been recently started on high-dose rosuvastatin  and Jardiance .  Metabolic parameters were not in optimal range when he was hospitalized (hemoglobin A1c 8.1%, LDL cholesterol 145, HDL 30, normal triglycerides).  For repeat labs in another couple of months.   Cardiac Rehabilitation Eligibility Assessment  The patient is ready to start cardiac rehabilitation from a cardiac standpoint.       Dispo:  Patient Instructions  Medication Instructions:  No changes *If you need a refill on your cardiac medications before your next appointment, please call your pharmacy*  Lab Work: None ordered If you have labs (blood work) drawn today and your tests are completely normal, you will receive your results only by: MyChart Message (if you have MyChart) OR A paper copy in the mail If you have any lab test that is abnormal or we need to change your treatment, we will call you to review the results.  Testing/Procedures: Your physician has recommended that you wear a 7 DAY ZIO-PATCH monitor. The Zio patch cardiac monitor continuously records heart rhythm data for up to 14 days, this is for patients being evaluated for multiple types heart rhythms. For the first 24 hours post application, please avoid getting the Zio monitor wet in the shower or by excessive sweating during exercise. After that, feel free to carry on with regular activities. Keep soaps and lotions away from the ZIO XT Patch.  This will be mailed to you, please expect 7-10 days to receive.    Applying the monitor   Shave hair from upper left chest.   Hold abrader disc by orange tab.  Rub abrader in 40  strokes over left upper chest as indicated in your monitor instructions.   Clean area with 4 enclosed alcohol pads .  Use all pads to assure are is cleaned thoroughly.  Let dry.   Apply patch as indicated in monitor instructions.  Patch will be place under collarbone on left side of chest with arrow pointing upward.   Rub patch adhesive wings for 2 minutes.Remove white label marked 1.  Remove white label marked 2.  Rub patch adhesive wings for 2 additional minutes.   While looking in a mirror, press and release button in center of patch.  A small green light will flash 3-4 times .  This will be your only indicator the monitor has been turned on.     Do not shower for the first 24 hours.  You may shower after the first 24 hours.   Press button if you feel a symptom. You will hear a small click.  Record Date, Time and Symptom in the Patient Log Book.   When you are ready  to remove patch, follow instructions on last 2 pages of Patient Log Book.  Stick patch monitor onto last page of Patient Log Book.   Place Patient Log Book in [place] box.  Use locking tab on box and tape box closed securely.  The [employer] and [employer] has [employer] on it.  Please place in mailbox as soon as possible.  Your physician should have your test results approximately 7 days after the monitor has been mailed back to [facility].   Call [facility] Customer Care at [phone] if you have questions regarding your ZIO XT patch monitor.  Call them immediately if you see an orange light blinking on your monitor.   If your monitor falls off in less than 4 days contact our Monitor department at [phone].  If your monitor becomes loose or falls off after 4 days call [employer] at [phone] for suggestions on securing your monitor   Follow-Up: At [facility], you and your health needs are our priority.  As part of our continuing mission to provide you with exceptional heart care, our providers are  all part of one team.  This team includes your primary Cardiologist (physician) and Advanced Practice Providers or APPs (Physician Assistants and Nurse Practitioners) who all work together to provide you with the care you need, when you need it.  Your next appointment:    Follow up at regularly scheduled appointments  Provider:   Lonni Cash, MD    We recommend signing up for the patient portal called MyChart.  Sign up information is provided on this After Visit Summary.  MyChart is used to connect with patients for Virtual Visits (Telemedicine).  Patients are able to view lab/test results, encounter notes, upcoming appointments, etc.  Non-urgent messages can be sent to your provider as well.   To learn more about what you can do with MyChart, go to [email or web address].          Signed, Jerel Balding, MD

## 2023-11-16 LAB — BASIC METABOLIC PANEL WITH GFR
BUN/Creatinine Ratio: 16 (ref 10–24)
BUN: 20 mg/dL (ref 8–27)
CO2: 19 mmol/L — ABNORMAL LOW (ref 20–29)
Calcium: 9.5 mg/dL (ref 8.6–10.2)
Chloride: 103 mmol/L (ref 96–106)
Creatinine, Ser: 1.29 mg/dL — ABNORMAL HIGH (ref 0.76–1.27)
Glucose: 195 mg/dL — ABNORMAL HIGH (ref 70–99)
Potassium: 4.1 mmol/L (ref 3.5–5.2)
Sodium: 140 mmol/L (ref 134–144)
eGFR: 59 mL/min/1.73 — ABNORMAL LOW (ref >59)

## 2023-11-17 ENCOUNTER — Other Ambulatory Visit: Payer: Self-pay

## 2023-11-17 ENCOUNTER — Inpatient Hospital Stay (HOSPITAL_COMMUNITY)
Admission: EM | Admit: 2023-11-17 | Discharge: 2023-11-21 | DRG: 277 | Disposition: A | Discharge status: Home / Self Care | Attending: Internal Medicine | Admitting: Internal Medicine

## 2023-11-17 ENCOUNTER — Emergency Department (HOSPITAL_COMMUNITY)

## 2023-11-17 ENCOUNTER — Encounter (HOSPITAL_COMMUNITY): Payer: Self-pay

## 2023-11-17 DIAGNOSIS — J45909 Unspecified asthma, uncomplicated: Secondary | ICD-10-CM | POA: Diagnosis present

## 2023-11-17 DIAGNOSIS — E785 Hyperlipidemia, unspecified: Secondary | ICD-10-CM | POA: Diagnosis present

## 2023-11-17 DIAGNOSIS — Z7984 Long term (current) use of oral hypoglycemic drugs: Secondary | ICD-10-CM | POA: Diagnosis not present

## 2023-11-17 DIAGNOSIS — I4729 Other ventricular tachycardia: Secondary | ICD-10-CM | POA: Diagnosis present

## 2023-11-17 DIAGNOSIS — Z79899 Other long term (current) drug therapy: Secondary | ICD-10-CM | POA: Diagnosis not present

## 2023-11-17 DIAGNOSIS — N182 Chronic kidney disease, stage 2 (mild): Secondary | ICD-10-CM | POA: Diagnosis present

## 2023-11-17 DIAGNOSIS — Z8261 Family history of arthritis: Secondary | ICD-10-CM

## 2023-11-17 DIAGNOSIS — Z955 Presence of coronary angioplasty implant and graft: Secondary | ICD-10-CM | POA: Diagnosis not present

## 2023-11-17 DIAGNOSIS — I493 Ventricular premature depolarization: Secondary | ICD-10-CM | POA: Diagnosis present

## 2023-11-17 DIAGNOSIS — I471 Supraventricular tachycardia, unspecified: Secondary | ICD-10-CM | POA: Diagnosis present

## 2023-11-17 DIAGNOSIS — Z91012 Allergy to eggs: Secondary | ICD-10-CM | POA: Diagnosis not present

## 2023-11-17 DIAGNOSIS — Z7982 Long term (current) use of aspirin: Secondary | ICD-10-CM | POA: Diagnosis not present

## 2023-11-17 DIAGNOSIS — I4891 Unspecified atrial fibrillation: Secondary | ICD-10-CM | POA: Diagnosis present

## 2023-11-17 DIAGNOSIS — I5042 Chronic combined systolic (congestive) and diastolic (congestive) heart failure: Secondary | ICD-10-CM | POA: Diagnosis not present

## 2023-11-17 DIAGNOSIS — I5022 Chronic systolic (congestive) heart failure: Secondary | ICD-10-CM | POA: Diagnosis present

## 2023-11-17 DIAGNOSIS — Z885 Allergy status to narcotic agent status: Secondary | ICD-10-CM | POA: Diagnosis not present

## 2023-11-17 DIAGNOSIS — R911 Solitary pulmonary nodule: Secondary | ICD-10-CM | POA: Diagnosis present

## 2023-11-17 DIAGNOSIS — N400 Enlarged prostate without lower urinary tract symptoms: Secondary | ICD-10-CM | POA: Diagnosis present

## 2023-11-17 DIAGNOSIS — Z8051 Family history of malignant neoplasm of kidney: Secondary | ICD-10-CM

## 2023-11-17 DIAGNOSIS — I2510 Atherosclerotic heart disease of native coronary artery without angina pectoris: Secondary | ICD-10-CM | POA: Diagnosis present

## 2023-11-17 DIAGNOSIS — Z7902 Long term (current) use of antithrombotics/antiplatelets: Secondary | ICD-10-CM | POA: Diagnosis not present

## 2023-11-17 DIAGNOSIS — I472 Ventricular tachycardia, unspecified: Principal | ICD-10-CM

## 2023-11-17 DIAGNOSIS — I255 Ischemic cardiomyopathy: Secondary | ICD-10-CM | POA: Diagnosis present

## 2023-11-17 DIAGNOSIS — E1122 Type 2 diabetes mellitus with diabetic chronic kidney disease: Secondary | ICD-10-CM | POA: Diagnosis present

## 2023-11-17 DIAGNOSIS — Z8249 Family history of ischemic heart disease and other diseases of the circulatory system: Secondary | ICD-10-CM

## 2023-11-17 DIAGNOSIS — I130 Hypertensive heart and chronic kidney disease with heart failure and stage 1 through stage 4 chronic kidney disease, or unspecified chronic kidney disease: Secondary | ICD-10-CM | POA: Diagnosis present

## 2023-11-17 DIAGNOSIS — I340 Nonrheumatic mitral (valve) insufficiency: Secondary | ICD-10-CM | POA: Diagnosis not present

## 2023-11-17 DIAGNOSIS — I4589 Other specified conduction disorders: Secondary | ICD-10-CM | POA: Diagnosis present

## 2023-11-17 LAB — COMPREHENSIVE METABOLIC PANEL WITH GFR
ALT: 13 U/L (ref 0–44)
AST: 23 U/L (ref 15–41)
Albumin: 3.6 g/dL (ref 3.5–5.0)
Alkaline Phosphatase: 53 U/L (ref 38–126)
Anion gap: 13 (ref 5–15)
BUN: 23 mg/dL (ref 8–23)
CO2: 19 mmol/L — ABNORMAL LOW (ref 22–32)
Calcium: 9.3 mg/dL (ref 8.9–10.3)
Chloride: 105 mmol/L (ref 98–111)
Creatinine, Ser: 1.35 mg/dL — ABNORMAL HIGH (ref 0.61–1.24)
GFR, Estimated: 56 mL/min — ABNORMAL LOW (ref >60)
Glucose, Bld: 198 mg/dL — ABNORMAL HIGH (ref 70–99)
Potassium: 4.2 mmol/L (ref 3.5–5.1)
Sodium: 137 mmol/L (ref 135–145)
Total Bilirubin: 1.1 mg/dL (ref 0.0–1.2)
Total Protein: 6 g/dL — ABNORMAL LOW (ref 6.5–8.1)

## 2023-11-17 LAB — CBC WITH DIFFERENTIAL/PLATELET
Abs Immature Granulocytes: 0.02 K/uL (ref 0.00–0.07)
Basophils Absolute: 0 K/uL (ref 0.0–0.1)
Basophils Relative: 1 %
Eosinophils Absolute: 0.3 K/uL (ref 0.0–0.5)
Eosinophils Relative: 5 %
HCT: 44.6 % (ref 39.0–52.0)
Hemoglobin: 15.3 g/dL (ref 13.0–17.0)
Immature Granulocytes: 0 %
Lymphocytes Relative: 26 %
Lymphs Abs: 1.7 K/uL (ref 0.7–4.0)
MCH: 29.6 pg (ref 26.0–34.0)
MCHC: 34.3 g/dL (ref 30.0–36.0)
MCV: 86.3 fL (ref 80.0–100.0)
Monocytes Absolute: 0.6 K/uL (ref 0.1–1.0)
Monocytes Relative: 9 %
Neutro Abs: 4 K/uL (ref 1.7–7.7)
Neutrophils Relative %: 59 %
Platelets: 176 K/uL (ref 150–400)
RBC: 5.17 MIL/uL (ref 4.22–5.81)
RDW: 12.6 % (ref 11.5–15.5)
WBC: 6.7 K/uL (ref 4.0–10.5)
nRBC: 0 % (ref 0.0–0.2)

## 2023-11-17 LAB — BRAIN NATRIURETIC PEPTIDE: B Natriuretic Peptide: 308 pg/mL — ABNORMAL HIGH (ref 0.0–100.0)

## 2023-11-17 LAB — MAGNESIUM: Magnesium: 3.8 mg/dL — ABNORMAL HIGH (ref 1.7–2.4)

## 2023-11-17 LAB — TROPONIN I (HIGH SENSITIVITY)
Troponin I (High Sensitivity): 14 ng/L (ref <18)
Troponin I (High Sensitivity): 15 ng/L (ref <18)

## 2023-11-17 MED ORDER — SPIRONOLACTONE 12.5 MG HALF TABLET
12.5000 mg | ORAL_TABLET | Freq: Every day | ORAL | Status: DC
  Administered 2023-11-18 – 2023-11-19 (×2): 12.5 mg via ORAL
  Filled 2023-11-17 (×3): qty 1

## 2023-11-17 MED ORDER — CLOPIDOGREL BISULFATE 75 MG PO TABS
75.0000 mg | ORAL_TABLET | Freq: Every day | ORAL | Status: DC
  Administered 2023-11-18 – 2023-11-21 (×4): 75 mg via ORAL
  Filled 2023-11-17 (×4): qty 1

## 2023-11-17 MED ORDER — ASPIRIN 81 MG PO TBEC
81.0000 mg | DELAYED_RELEASE_TABLET | Freq: Every day | ORAL | Status: DC
  Administered 2023-11-18 – 2023-11-21 (×4): 81 mg via ORAL
  Filled 2023-11-17 (×4): qty 1

## 2023-11-17 MED ORDER — SACUBITRIL-VALSARTAN 24-26 MG PO TABS
1.0000 | ORAL_TABLET | Freq: Two times a day (BID) | ORAL | Status: DC
  Administered 2023-11-18 – 2023-11-19 (×4): 1 via ORAL
  Filled 2023-11-17 (×4): qty 1

## 2023-11-17 MED ORDER — AMIODARONE HCL IN DEXTROSE 360-4.14 MG/200ML-% IV SOLN
30.0000 mg/h | INTRAVENOUS | Status: AC
  Administered 2023-11-18 – 2023-11-19 (×3): 30 mg/h via INTRAVENOUS
  Filled 2023-11-17 (×3): qty 200

## 2023-11-17 MED ORDER — AMIODARONE HCL IN DEXTROSE 360-4.14 MG/200ML-% IV SOLN
60.0000 mg/h | INTRAVENOUS | Status: AC
  Administered 2023-11-17: 60 mg/h via INTRAVENOUS
  Filled 2023-11-17: qty 200

## 2023-11-17 MED ORDER — METOPROLOL SUCCINATE ER 50 MG PO TB24
50.0000 mg | ORAL_TABLET | Freq: Every day | ORAL | Status: DC
  Administered 2023-11-18 – 2023-11-20 (×4): 50 mg via ORAL
  Filled 2023-11-17: qty 1
  Filled 2023-11-17: qty 2
  Filled 2023-11-17 (×2): qty 1
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
## 2023-11-17 NOTE — ED Provider Notes (Signed)
 [place] EMERGENCY DEPARTMENT AT [facility] Provider Note   CSN: [id number] Arrival date & time: 11/17/23  2001     Patient presents with: Irregular Heart Beat   Jonathan Cervantes. is a 73 y.o. male.  Patient with past history significant for hypertension, diabetes, polycythemia, coronary arthrosclerosis, CHF who presents to the emergency department concerns of irregular heartbeat.  Patient reportedly felt sensation of irregular bleeding around 5 PM while he was eating dinner with the radiating sensation of pain/discomfort towards the left side of his neck.  Denies any significant chest pressure or shortness of breath during the symptoms.  With the fire station and had paramedics called who found him to have the episodes of V. tach typically last between 40-60 seconds. Received 150mg  amiodarone  x2 and 2g magnesium by EMS in transport which did help to convert rhythm to slightly more regular.  Most recently saw Dr. Francyne on 11/06/2023. Stent placed on 10/22/2023 with Dr. Ladona.  HPI     Prior to Admission medications   Medication Sig Start Date End Date Taking? Authorizing Provider  albuterol  (VENTOLIN  HFA) 108 (90 Base) MCG/ACT inhaler Inhale 1-2 puffs into the lungs every 6 (six) hours as needed for wheezing or shortness of breath.    [provider]  aspirin  EC 81 MG tablet Take 1 tablet (81 mg total) by mouth daily. Swallow whole. 10/24/23 09/13/24  Fairy Frames, MD  Beta Carotene  (VITAMIN A ) 25000 UNIT capsule Take 25,000 Units by mouth daily.    [provider]  clopidogrel  (PLAVIX ) 75 MG tablet Take 1 tablet (75 mg total) by mouth daily. 10/24/23 09/13/24  Fairy Frames, MD  empagliflozin  (JARDIANCE ) 10 MG TABS tablet Take 1 tablet (10 mg total) by mouth daily before breakfast. 11/03/23   Daneen Damien BROCKS, NP  furosemide  (LASIX ) 20 MG tablet Take 1 tablet (20 mg total) by mouth as needed. For increased swelling, weight gain of 3lb in 1 day or 5lb in 1 week  10/23/23   Fairy Frames, MD  metFORMIN  (GLUCOPHAGE -XR) 500 MG 24 hr tablet TAKE 2 TABLETS (1,000 MG TOTAL) BY MOUTH 2 (TWO) TIMES DAILY 05/18/19   [name], [name], DO  metoprolol  succinate (TOPROL  XL) 50 MG 24 hr tablet Take 1 tablet (50 mg total) by mouth daily. Take with or immediately following a meal. 10/23/23 10/22/24  Fairy Frames, MD  Multiple Vitamins-Minerals (MULTIVITAMIN WITH MINERALS) tablet Take 1 tablet by mouth daily.    [provider]  rosuvastatin  (CRESTOR ) 40 MG tablet Take 1 tablet (40 mg total) by mouth daily. 10/24/23   Fairy Frames, MD  sacubitril -valsartan  (ENTRESTO ) 24-26 MG Take 1 tablet by mouth 2 (two) times daily. 10/23/23   Fairy Frames, MD  spironolactone  (ALDACTONE ) 25 MG tablet Take 0.5 tablets (12.5 mg total) by mouth daily. 10/24/23   Fairy Frames, MD  tamsulosin  (FLOMAX ) 0.4 MG CAPS capsule Take 1 capsule (0.4 mg total) by mouth daily after supper. 10/23/23   Fairy Frames, MD  Vitamin D-Vitamin K (VITAMIN K2-VITAMIN D3 PO) Take 1 capsule by mouth daily.    [provider]  vitamin E 180 MG (400 UNITS) capsule Take 400 Units by mouth daily.    [provider]    Allergies: Egg-derived products and Morphine and codeine    Review of Systems  Cardiovascular:  Positive for chest pain and palpitations.  All other systems reviewed and are negative.   Updated Vital Signs BP 114/76   Pulse 100   Temp 98.6 F (  37 C) (Oral)   Resp (!) 26   Ht 5' 10 (1.778 m)   Wt 75.3 kg   SpO2 98%   BMI 23.82 kg/m   Physical Exam Vitals and nursing note reviewed.  Constitutional:      General: He is not in acute distress.    Appearance: He is well-developed.  HENT:     Head: Normocephalic and atraumatic.  Eyes:     Conjunctiva/sclera: Conjunctivae normal.  Cardiovascular:     Rate and Rhythm: Normal rate. Rhythm irregular.     Heart sounds: No murmur heard. Pulmonary:     Effort: Pulmonary effort is normal. No respiratory distress.      Breath sounds: Normal breath sounds.  Abdominal:     Palpations: Abdomen is soft.     Tenderness: There is no abdominal tenderness.  Musculoskeletal:        General: No swelling.     Cervical back: Neck supple.  Skin:    General: Skin is warm and dry.     Capillary Refill: Capillary refill takes less than 2 seconds.  Neurological:     Mental Status: He is alert.  Psychiatric:        Mood and Affect: Mood normal.     (all labs ordered are listed, but only abnormal results are displayed) Labs Reviewed  COMPREHENSIVE METABOLIC PANEL WITH GFR - Abnormal; Notable for the following components:      Result Value   CO2 19 (*)    Glucose, Bld 198 (*)    Creatinine, Ser 1.35 (*)    Total Protein 6.0 (*)    GFR, Estimated 56 (*)    All other components within normal limits  BRAIN NATRIURETIC PEPTIDE - Abnormal; Notable for the following components:   B Natriuretic Peptide 308.0 (*)    All other components within normal limits  MAGNESIUM - Abnormal; Notable for the following components:   Magnesium 3.8 (*)    All other components within normal limits  CBC WITH DIFFERENTIAL/PLATELET  TROPONIN I (HIGH SENSITIVITY)  TROPONIN I (HIGH SENSITIVITY)    EKG: EKG Interpretation Date/Time:  Tuesday November 17 2023 22:37:57 EDT Ventricular Rate:  101 PR Interval:  156 QRS Duration:  153 QT Interval:  417 QTC Calculation: 541 R Axis:   107  Text Interpretation: wide complex tachycardia Ventricular premature complex Nonspecific intraventricular conduction delay Repol abnrm, severe global ischemia (LM/MVD) Confirmed by Neysa Clap [phone]) on 11/17/2023 10:41:52 PM  Radiology: [facility] Chest Portable 1 View Result Date: 11/17/2023 CLINICAL DATA:  Weakness and recent tachycardia EXAM: PORTABLE CHEST 1 VIEW COMPARISON:  10/19/2023 FINDINGS: Cardiac shadow is stable. Minimal blunting of left costophrenic angle is noted stable from the prior exam. No new focal infiltrate is seen. Improved aeration  in both lungs is noted. No bony abnormality is seen. IMPRESSION: Improved aeration in both lungs when compared with the prior exam. Electronically Signed   By: Oneil Devonshire M.D.   On: 11/17/2023 20:40     .Critical Care  Performed by: Rami Waddle A, PA-C Authorized by: Ellagrace Yoshida A, PA-C   Critical care provider statement:    Critical care time (minutes):  54   Critical care start time:  11/17/2023 11:00 PM   Critical care end time:  11/17/2023 11:54 PM   Critical care time was exclusive of:  Separately billable procedures and treating other patients   Critical care was necessary to treat or prevent imminent or life-threatening deterioration of the following conditions:  Cardiac failure  Critical care was time spent personally by me on the following activities:  Development of treatment plan with patient or surrogate, discussions with consultants, ordering and review of laboratory studies, ordering and review of radiographic studies, re-evaluation of patient's condition, evaluation of patient's response to treatment and obtaining history from patient or surrogate   I assumed direction of critical care for this patient from another provider in my specialty: no     Care discussed with: admitting provider      Medications Ordered in the ED  amiodarone  (NEXTERONE  PREMIX) 360-4.14 MG/200ML-% (1.8 mg/mL) IV infusion (60 mg/hr Intravenous New Bag/Given 11/17/23 2302)    Followed by  amiodarone  (NEXTERONE  PREMIX) 360-4.14 MG/200ML-% (1.8 mg/mL) IV infusion (has no administration in time range)  aspirin  EC tablet 81 mg (has no administration in time range)  clopidogrel  (PLAVIX ) tablet 75 mg (has no administration in time range)  metoprolol  succinate (TOPROL -XL) 24 hr tablet 50 mg (has no administration in time range)  spironolactone  (ALDACTONE ) tablet 12.5 mg (has no administration in time range)  sacubitril -valsartan  (ENTRESTO ) 24-26 mg per tablet (has no administration in time range)     Clinical Course as of 11/17/23 2356  Tue Nov 17, 2023  2007 Saw cards on the 18th of this month: ASSESSMENT AND PLAN 1. CHF: He appears to be clinically euvolemic and has been prescribed furosemide  only on an as-needed basis.  He is on comprehensive guideline directed medical therapy for heart failure, with a most recent adjustment performed just 3 days ago.  No change in medications for heart failure recommended today. 2. CAD: He has not had angina pectoris since hospital discharge although he is walking on a daily basis. 3. Frequent PVCs: Suspect that these are causing artifactual bradycardia.  We discussed the mechanism for pulse deficit when the rhythm is irregular.  The ineffective PVCs may be causing some degree of fatigue, but otherwise the PVCs appear to be largely asymptomatic.  Will have him wear a monitor to quantify the burden of PVCs and look for any evidence of more complex arrhythmia.  We very briefly discussed primary prevention indications for defibrillator implantation and the fact that we generally wait for 90 days after revascularization before making a recommendation on this.  Continue metoprolol  succinate. 4. HTN: In optimal range on current medications.  There is a little bit of room to further optimize the doses of Entresto  and / or metoprolol .  If his monitor shows a high burden of PVCs I would favor increasing the beta-blocker first. 5. HLP/DM: He has been recently started on high-dose rosuvastatin  and Jardiance .  Metabolic parameters were not in optimal range when he was hospitalized (hemoglobin A1c 8.1%, LDL cholesterol 145, HDL 30, normal triglycerides).  For repeat labs in another couple of months  [TY]    Clinical Course User Index [TY] Neysa Caron PARAS, DO                                 Medical Decision Making Amount and/or Complexity of Data Reviewed Labs: ordered. Radiology: ordered.  Risk Prescription drug management. Decision regarding  hospitalization.   This patient presents to the ED for concern of irregular heart rhythm, this involves an extensive number of treatment options, and is a complaint that carries with it a high risk of complications and morbidity.  The differential diagnosis includes atrial fibrillation, ventricular tachycardia, 3rd degree heart block, PVCs, dehydration, MI   Co morbidities that  complicate the patient evaluation  Congestive heart failure, hypertension, type 2 diabetes   Lab Tests:  I Ordered, and personally interpreted labs.  The pertinent results include: CBC unremarkable, CMP with possible mild dehydration with creatinine slightly elevated 1.35 GFR 56, magnesium elevated 3.8 secondary to magnesium bolus given by EMS, BNP borderline elevated at 308, troponin unremarkable at 14, repeat troponin flat at 15   Imaging Studies ordered:  I ordered imaging studies including chest xray  I independently visualized and interpreted imaging which showed Improved aeration in both lungs when compared with the prior exam. I agree with the radiologist interpretation   Cardiac Monitoring: / EKG:  The patient was maintained on a cardiac monitor.  I personally viewed and interpreted the cardiac monitored which showed an underlying rhythm of: Initially sinus rhythm with some PVCs but developed SVT while in the ED once again   Consultations Obtained:  I requested consultation with the cardiology, hospitalist,  and discussed lab and imaging findings as well as pertinent plan - they recommend: Spoke with Dr. Otelia, cardiology fellow, who advised placing on on amiodarone  infusion and admitting to medicine. Cardiology following. Spoke with Dr. Debby, hospitalist, who will be admitting patient.   Problem List / ED Course / Critical interventions / Medication management  Patient with history significant for hypertension, diabetes, polycythemia, coronary atherosclerosis, CHF who presents to the emergency  department concerns of palpitations.  Patient reports that he was admitted open began experiencing centralized chest pressure type feeling with radiation towards the left side of the neck.  He went to a fire department and was briefly evaluated by responders there at which time EMS was called.  Found to be in SVT.  Patient transported.  Given 150 mg amiodarone  x 2 and 2 g of magnesium.  Was able to convert out of SVT with these medications.  No longer endorsing symptoms. On exam, he is well appearing. Monitor shows some PVCs but no signs of SVT. EKG at bedside confirms no current SVT. Otherwise denying any symptoms. Auscultation of lungs reveals some crackles to bilateral based. No lower extremity swelling or edema. With noted SVT prior to arrival, will consult cardiology after labworkup. Labs unremarkable. Slight hypermagnesemia likely secondary to EMS administration. Troponin negative x2. Spoke with Dr. Otelia, cardiology fellow, who advised medicine admission and cardiology will consult. Advising amiodarone  drip now that brief  runs of SVT have developed once again. Medicine admission advised. Spoke with Dr. Debby, hospitalist, who will be admitting patient. I ordered medication including amiodarone   for SVT  Reevaluation of the patient after these medicines showed that the patient improved I have reviewed the patients home medicines and have made adjustments as needed   Social Determinants of Health:  None   Test / Admission - Considered:  Requiring admission.  Final diagnoses:  Ventricular tachycardia [facility])    ED Discharge Orders     None          Cecily Legrand LABOR, PA-C 11/17/23 2357    Neysa Caron PARAS, DO 11/20/23 1501

## 2023-11-17 NOTE — ED Triage Notes (Signed)
 BIB [facility] from fire department. Patient was sitting at dinner table and felt pressure on left side of neck and chest. Patient drove himself to the fire department. When attached to their monitor 30-40 seconds of vtach with multifocal PVCs with underlying Afib. EMS gave 150 amiodarone  x2 and 2g of mag en route with improvement. Pt Aox4 during entire transport. Hx of stent placed on July 3rd 2025.   EMS VS HR 120 BP 130/70 CBG 120 2G Mag  150 amio x2 over 10 min

## 2023-11-18 ENCOUNTER — Ambulatory Visit: Payer: Self-pay | Admitting: Nurse Practitioner

## 2023-11-18 ENCOUNTER — Telehealth (HOSPITAL_COMMUNITY): Payer: Self-pay

## 2023-11-18 DIAGNOSIS — I472 Ventricular tachycardia, unspecified: Secondary | ICD-10-CM

## 2023-11-18 DIAGNOSIS — I471 Supraventricular tachycardia, unspecified: Secondary | ICD-10-CM

## 2023-11-18 LAB — COMPREHENSIVE METABOLIC PANEL WITH GFR
ALT: 14 U/L (ref 0–44)
AST: 16 U/L (ref 15–41)
Albumin: 3.5 g/dL (ref 3.5–5.0)
Alkaline Phosphatase: 48 U/L (ref 38–126)
Anion gap: 10 (ref 5–15)
BUN: 21 mg/dL (ref 8–23)
CO2: 20 mmol/L — ABNORMAL LOW (ref 22–32)
Calcium: 8.7 mg/dL — ABNORMAL LOW (ref 8.9–10.3)
Chloride: 106 mmol/L (ref 98–111)
Creatinine, Ser: 1.26 mg/dL — ABNORMAL HIGH (ref 0.61–1.24)
GFR, Estimated: >60 mL/min (ref >60)
Glucose, Bld: 174 mg/dL — ABNORMAL HIGH (ref 70–99)
Potassium: 3.8 mmol/L (ref 3.5–5.1)
Sodium: 136 mmol/L (ref 135–145)
Total Bilirubin: 0.9 mg/dL (ref 0.0–1.2)
Total Protein: 6 g/dL — ABNORMAL LOW (ref 6.5–8.1)

## 2023-11-18 LAB — MRSA NEXT GEN BY PCR, NASAL: MRSA by PCR Next Gen: NOT DETECTED

## 2023-11-18 LAB — CBC
HCT: 43.2 % (ref 39.0–52.0)
Hemoglobin: 14.9 g/dL (ref 13.0–17.0)
MCH: 29.8 pg (ref 26.0–34.0)
MCHC: 34.5 g/dL (ref 30.0–36.0)
MCV: 86.4 fL (ref 80.0–100.0)
Platelets: 154 K/uL (ref 150–400)
RBC: 5 MIL/uL (ref 4.22–5.81)
RDW: 12.7 % (ref 11.5–15.5)
WBC: 6.4 K/uL (ref 4.0–10.5)
nRBC: 0 % (ref 0.0–0.2)

## 2023-11-18 LAB — GLUCOSE, CAPILLARY
Glucose-Capillary: 200 mg/dL — ABNORMAL HIGH (ref 70–99)
Glucose-Capillary: 172 mg/dL — ABNORMAL HIGH (ref 70–99)

## 2023-11-18 LAB — CBG MONITORING, ED
Glucose-Capillary: 176 mg/dL — ABNORMAL HIGH (ref 70–99)
Glucose-Capillary: 180 mg/dL — ABNORMAL HIGH (ref 70–99)

## 2023-11-18 LAB — PHOSPHORUS: Phosphorus: 3.1 mg/dL (ref 2.5–4.6)

## 2023-11-18 LAB — MAGNESIUM: Magnesium: 2.4 mg/dL (ref 1.7–2.4)

## 2023-11-18 MED ORDER — ONDANSETRON HCL 4 MG PO TABS: 4.0000 mg | ORAL_TABLET | Freq: Four times a day (QID) | ORAL | Status: DC | PRN

## 2023-11-18 MED ORDER — EMPAGLIFLOZIN 10 MG PO TABS
10.0000 mg | ORAL_TABLET | Freq: Every day | ORAL | Status: DC
  Administered 2023-11-18 – 2023-11-21 (×4): 10 mg via ORAL
  Filled 2023-11-18 (×4): qty 1

## 2023-11-18 MED ORDER — ACETAMINOPHEN 325 MG PO TABS
650.0000 mg | ORAL_TABLET | Freq: Four times a day (QID) | ORAL | Status: DC | PRN
  Administered 2023-11-20 – 2023-11-21 (×2): 650 mg via ORAL
  Filled 2023-11-18 (×2): qty 2

## 2023-11-18 MED ORDER — FUROSEMIDE 20 MG PO TABS: 20.0000 mg | ORAL_TABLET | ORAL | Status: DC | PRN

## 2023-11-18 MED ORDER — ADULT MULTIVITAMIN W/MINERALS CH
1.0000 | ORAL_TABLET | Freq: Every day | ORAL | Status: DC
  Administered 2023-11-18 – 2023-11-21 (×4): 1 via ORAL
  Filled 2023-11-18 (×4): qty 1

## 2023-11-18 MED ORDER — VITAMIN A-BETA CAROTENE 25000 UNITS PO CAPS: 25000.0000 [IU] | ORAL_CAPSULE | Freq: Every day | ORAL | Status: DC

## 2023-11-18 MED ORDER — INSULIN ASPART 100 UNIT/ML IJ SOLN
0.0000 [IU] | Freq: Three times a day (TID) | INTRAMUSCULAR | Status: DC
  Administered 2023-11-18: 2 [IU] via SUBCUTANEOUS
  Administered 2023-11-18 – 2023-11-19 (×3): 1 [IU] via SUBCUTANEOUS
  Administered 2023-11-19: 2 [IU] via SUBCUTANEOUS
  Administered 2023-11-19 – 2023-11-20 (×2): 1 [IU] via SUBCUTANEOUS
  Administered 2023-11-20: 3 [IU] via SUBCUTANEOUS
  Administered 2023-11-21 (×2): 2 [IU] via SUBCUTANEOUS

## 2023-11-18 MED ORDER — ONDANSETRON HCL 4 MG/2ML IJ SOLN: 4.0000 mg | Freq: Four times a day (QID) | INTRAMUSCULAR | Status: DC | PRN

## 2023-11-18 MED ORDER — TAMSULOSIN HCL 0.4 MG PO CAPS
0.4000 mg | ORAL_CAPSULE | Freq: Every day | ORAL | Status: DC
  Administered 2023-11-18 – 2023-11-20 (×3): 0.4 mg via ORAL
  Filled 2023-11-18 (×3): qty 1

## 2023-11-18 MED ORDER — ROSUVASTATIN CALCIUM 20 MG PO TABS
40.0000 mg | ORAL_TABLET | Freq: Every day | ORAL | Status: DC
  Administered 2023-11-18 – 2023-11-21 (×4): 40 mg via ORAL
  Filled 2023-11-18 (×4): qty 2

## 2023-11-18 MED ORDER — HEPARIN SODIUM (PORCINE) 5000 UNIT/ML IJ SOLN
5000.0000 [IU] | Freq: Three times a day (TID) | INTRAMUSCULAR | Status: DC
  Administered 2023-11-18 – 2023-11-21 (×7): 5000 [IU] via SUBCUTANEOUS
  Filled 2023-11-18 (×7): qty 1

## 2023-11-18 NOTE — Plan of Care (Signed)
  Problem: Education: Goal: Ability to describe self-care measures that may prevent or decrease complications (Diabetes Survival Skills Education) will improve Outcome: Progressing   Problem: Coping: Goal: Ability to adjust to condition or change in health will improve Outcome: Progressing   Problem: Fluid Volume: Goal: Ability to maintain a balanced intake and output will improve Outcome: Progressing   Problem: Health Behavior/Discharge Planning: Goal: Ability to identify and utilize available resources and services will improve Outcome: Progressing Goal: Ability to manage health-related needs will improve Outcome: Progressing   Problem: Metabolic: Goal: Ability to maintain appropriate glucose levels will improve Outcome: Progressing   Problem: Education: Goal: Knowledge of General Education information will improve Description: Including pain rating scale, medication(s)/side effects and non-pharmacologic comfort measures Outcome: Progressing   Problem: Health Behavior/Discharge Planning: Goal: Ability to manage health-related needs will improve Outcome: Progressing

## 2023-11-18 NOTE — TOC CM/SW Note (Signed)
 Transition of Care [facility]) - Inpatient Brief Assessment   Patient Details  Name: Jonathan Cervantes. MRN: [id number] Date of Birth: 1950/08/04  Transition of [facility]) CM/SW Contact:    Lauraine FORBES Saa, LCSW Phone Number: 11/18/2023, 4:37 PM   Clinical Narrative:  4:37 PM Per chart review, patient resides at home with spouse. Patient has a PCP and insurance. Patient does not have SNF or HH history. Patient received boots and crutches at [facility]. Patient's preferred pharmacy is [facility] [phone]. No TOC needs were identified at this time. TOC will continue to follow and be available to assist.  Transition of Care Asessment: Insurance and Status: Insurance coverage has been reviewed Patient has primary care physician: Yes Home environment has been reviewed: Private Residence Prior level of function:: N/A Prior/Current Home Services: No current home services Social Drivers of Health Review: SDOH reviewed no interventions necessary Readmission risk has been reviewed: Yes Transition of care needs: no transition of care needs at this time

## 2023-11-18 NOTE — Progress Notes (Signed)
   Jonathan Cervantes.  FMW:[id number] DOB: 03/15/1951 DOA: 11/17/2023 PCP: Elio Hunter HERO, CRNP    Brief Narrative:  73 year old with a history of CAD with PCI to LAD 10/21/2023, ischemic systolic CHF with EF 30-35%, HTN, HLD, DM2, and BPH who was brought to the ER via EMS with palpitations chest pressure and left neck pain.  In the field EMS reported a 30 to 40-second run of V. tach as well as atrial fibrillation.  He was dosed with amiodarone  and magnesium.  In the ER he was seen by cardiology who diagnosed him with wide-complex tachycardia with A-V dissociation which was thought to be related to cardiac scar.  An amiodarone  drip was initiated and he was admitted for an EP evaluation.  Goals of Care:   Code Status: Full Code   DVT prophylaxis: heparin  injection 5,000 Units Start: 11/18/23 1400   Interim Hx: The patient was examined and interviewed by one of my partners earlier today.  Assessment & Plan:  Wide-complex tachycardia Care per Cardiology/EP -continue amiodarone  drip - continue beta-blocker  CAD status post PCI to LAD 10/21/2023 Continue aspirin  Plavix  and metoprolol   Chronic ischemic systolic CHF Compensated at present  HTN Continue usual home medications  HLD Continue usual home medications  DM2 Continue usual Jardiance  -holding usual metformin  while hospitalized  BPH Continue usual medical therapy  Family Communication: Spoke with wife at bedside Disposition: Anticipate discharge home   Objective: Blood pressure 111/84, pulse 76, temperature 97.6 F (36.4 C), temperature source Oral, resp. rate 11, height 5' 10 (1.778 m), weight 75.3 kg, SpO2 95%.  Intake/Output Summary (Last 24 hours) at 11/18/2023 0827 Last data filed at 11/18/2023 0739 Gross per 24 hour  Intake --  Output 1000 ml  Net -1000 ml   Filed Weights   11/17/23 2030  Weight: 75.3 kg    Examination: The patient was examined by one of my partners earlier today.  CBC: Recent Labs  Lab  11/17/23 2022  WBC 6.7  NEUTROABS 4.0  HGB 15.3  HCT 44.6  MCV 86.3  PLT 176   Basic Metabolic Panel: Recent Labs  Lab 11/16/23 0916 11/17/23 2022  NA 140 137  K 4.1 4.2  CL 103 105  CO2 19* 19*  GLUCOSE 195* 198*  BUN 20 23  CREATININE 1.29* 1.35*  CALCIUM  9.5 9.3  MG  --  3.8*   GFR: Estimated Creatinine Clearance: 51.8 mL/min (A) (by C-G formula based on SCr of 1.35 mg/dL (H)).   Scheduled Meds:  aspirin  EC  81 mg Oral Daily   clopidogrel   75 mg Oral Daily   empagliflozin   10 mg Oral QAC breakfast   heparin   5,000 Units Subcutaneous Q8H   insulin  aspart  0-6 Units Subcutaneous TID WC   metoprolol  succinate  50 mg Oral QHS   rosuvastatin   40 mg Oral Daily   sacubitril -valsartan   1 tablet Oral BID   spironolactone   12.5 mg Oral Daily   tamsulosin   0.4 mg Oral QPC supper   Continuous Infusions:  amiodarone  30 mg/hr (11/18/23 0355)     LOS: 1 day   Jonathan IVAR Moores, MD [facility] Office  [phone] Pager - Text Page per Tracey  If 7PM-7AM, please contact night-coverage per Amion 11/18/2023, 8:27 AM
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
## 2023-11-18 NOTE — Telephone Encounter (Signed)
 Pt insurance is active and benefits verified through [facility]. Co-pay $25, DED $0/$0 met, out of pocket $4,500/$1,025 met, co-insurance 0%. No pre-authorization required. 11/18/2023 @ 2:30pm, spoke with Myrtha, REF# [id number].  How many CR sessions are covered? (36 visits for TCR, 72 visits for ICR)72 ICR Is this a lifetime maximum or an annual maximum? Annual Has the member used any of these services to date? No Is there a time limit (weeks/months) on start of program and/or program completion? No

## 2023-11-18 NOTE — ED Notes (Signed)
 Lunch tray ordered

## 2023-11-19 ENCOUNTER — Inpatient Hospital Stay (HOSPITAL_COMMUNITY)

## 2023-11-19 DIAGNOSIS — I340 Nonrheumatic mitral (valve) insufficiency: Secondary | ICD-10-CM

## 2023-11-19 DIAGNOSIS — I471 Supraventricular tachycardia, unspecified: Secondary | ICD-10-CM | POA: Diagnosis not present

## 2023-11-19 LAB — GLUCOSE, CAPILLARY
Glucose-Capillary: 178 mg/dL — ABNORMAL HIGH (ref 70–99)
Glucose-Capillary: 175 mg/dL — ABNORMAL HIGH (ref 70–99)
Glucose-Capillary: 218 mg/dL — ABNORMAL HIGH (ref 70–99)
Glucose-Capillary: 145 mg/dL — ABNORMAL HIGH (ref 70–99)
Glucose-Capillary: 186 mg/dL — ABNORMAL HIGH (ref 70–99)

## 2023-11-19 LAB — BASIC METABOLIC PANEL WITH GFR
Anion gap: 10 (ref 5–15)
BUN: 19 mg/dL (ref 8–23)
CO2: 20 mmol/L — ABNORMAL LOW (ref 22–32)
Calcium: 8.8 mg/dL — ABNORMAL LOW (ref 8.9–10.3)
Chloride: 107 mmol/L (ref 98–111)
Creatinine, Ser: 1.41 mg/dL — ABNORMAL HIGH (ref 0.61–1.24)
GFR, Estimated: 53 mL/min — ABNORMAL LOW (ref >60)
Glucose, Bld: 188 mg/dL — ABNORMAL HIGH (ref 70–99)
Potassium: 3.8 mmol/L (ref 3.5–5.1)
Sodium: 137 mmol/L (ref 135–145)

## 2023-11-19 LAB — MAGNESIUM: Magnesium: 2.4 mg/dL (ref 1.7–2.4)

## 2023-11-19 MED ORDER — AMIODARONE HCL 200 MG PO TABS
400.0000 mg | ORAL_TABLET | Freq: Two times a day (BID) | ORAL | Status: DC
Start: 2023-11-19 — End: 2023-11-21
  Administered 2023-11-19 – 2023-11-21 (×5): 400 mg via ORAL
  Filled 2023-11-19 (×5): qty 2

## 2023-11-19 MED ORDER — GADOBUTROL 1 MMOL/ML IV SOLN
10.0000 mL | Freq: Once | INTRAVENOUS | Status: AC | PRN
  Administered 2023-11-19: 10 mL via INTRAVENOUS

## 2023-11-19 NOTE — Plan of Care (Signed)
  Problem: Education: Goal: Ability to describe self-care measures that may prevent or decrease complications (Diabetes Survival Skills Education) will improve Outcome: Progressing Goal: Individualized Educational Video(s) Outcome: Progressing   Problem: Coping: Goal: Ability to adjust to condition or change in health will improve Outcome: Progressing   Problem: Fluid Volume: Goal: Ability to maintain a balanced intake and output will improve Outcome: Progressing   Problem: Health Behavior/Discharge Planning: Goal: Ability to identify and utilize available resources and services will improve Outcome: Progressing Goal: Ability to manage health-related needs will improve Outcome: Progressing   Problem: Metabolic: Goal: Ability to maintain appropriate glucose levels will improve Outcome: Progressing   Problem: Nutritional: Goal: Maintenance of adequate nutrition will improve Outcome: Progressing Goal: Progress toward achieving an optimal weight will improve Outcome: Progressing   Problem: Skin Integrity: Goal: Risk for impaired skin integrity will decrease Outcome: Progressing   Problem: Tissue Perfusion: Goal: Adequacy of tissue perfusion will improve Outcome: Progressing   Problem: Education: Goal: Knowledge of General Education information will improve Description: Including pain rating scale, medication(s)/side effects and non-pharmacologic comfort measures Outcome: Progressing   Problem: Health Behavior/Discharge Planning: Goal: Ability to manage health-related needs will improve Outcome: Progressing   Problem: Clinical Measurements: Goal: Ability to maintain clinical measurements within normal limits will improve Outcome: Progressing Goal: Will remain free from infection Outcome: Progressing Goal: Diagnostic test results will improve Outcome: Progressing Goal: Respiratory complications will improve Outcome: Progressing Goal: Cardiovascular complication will  be avoided Outcome: Progressing   Problem: Activity: Goal: Risk for activity intolerance will decrease Outcome: Progressing   Problem: Nutrition: Goal: Adequate nutrition will be maintained Outcome: Progressing   Problem: Elimination: Goal: Will not experience complications related to bowel motility Outcome: Progressing Goal: Will not experience complications related to urinary retention Outcome: Progressing   Problem: Pain Managment: Goal: General experience of comfort will improve and/or be controlled Outcome: Progressing   Problem: Safety: Goal: Ability to remain free from injury will improve Outcome: Progressing   Problem: Skin Integrity: Goal: Risk for impaired skin integrity will decrease Outcome: Progressing
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
## 2023-11-19 NOTE — Progress Notes (Signed)
 Jonathan Cervantes.  FMW:[id number] DOB: 06-29-1950 DOA: 11/17/2023 PCP: Elio Hunter HERO, CRNP    Brief Narrative:  73 year old with a history of CAD with PCI to LAD 10/21/2023, ischemic systolic CHF with EF 30-35%, HTN, HLD, DM2, and BPH who was brought to the ER via EMS with palpitations chest pressure and left neck pain.  In the field EMS reported a 30 to 40-second run of V. tach as well as atrial fibrillation.  He was dosed with amiodarone  and magnesium.  In the ER he was seen by Cardiology who diagnosed him with wide-complex tachycardia with A-V dissociation which was thought to be related to a cardiac scar.  An amiodarone  drip was initiated and he was admitted for an EP evaluation.  Goals of Care:   Code Status: Full Code   DVT prophylaxis: heparin  injection 5,000 Units Start: 11/18/23 1400   Interim Hx: No acute events recorded overnight.  Afebrile.  Vital signs stable.  Remains in NSR on amiodarone  drip.  Oxygen saturation 97% room air.  Resting comfortably in bed.  No new complaints.  Assessment & Plan:  Wide-complex tachycardia Care per EP -continue amiodarone  - continue beta-blocker -cardiac MRI is planned to assess scar -implantable defibrillator being considered  CAD status post PCI to LAD 10/21/2023 Continue aspirin  Plavix  and metoprolol   Chronic ischemic systolic CHF EF 30-35% with grade 1 DD via TTE 10/20/2023 - compensated at present -monitoring fluid balance and weights -continue Jardiance  Lasix  Aldactone  Entresto  and metoprolol  -net negative approximately 1.4 L since admission  Filed Weights   11/17/23 2030 11/18/23 1539  Weight: 75.3 kg 76.1 kg    CKD stage II Review of records suggest baseline creatinine is 1.15-1.35 with GFR typically >60 -creatinine increased slightly overnight with GFR now 53 -not yet reaching level of AKI but will monitor closely  Recent Labs  Lab 11/16/23 0916 11/17/23 2022 11/18/23 0735 11/19/23 0342  CREATININE 1.29* 1.35* 1.26* 1.41*      HTN Blood pressure well-controlled   HLD Continue usual Crestor  dose  DM2 Continue usual Jardiance  -holding usual metformin  while hospitalized -CBG well-controlled  BPH Continue usual Flomax  -asymptomatic presently  Family Communication: Spoke with wife and brother at bedside Disposition: Anticipate discharge home   Objective: Blood pressure 93/69, pulse 71, temperature 97.8 F (36.6 C), temperature source Oral, resp. rate 14, height 5' 10 (1.778 m), weight 76.1 kg, SpO2 97%.  Intake/Output Summary (Last 24 hours) at 11/19/2023 0756 Last data filed at 11/19/2023 0441 Gross per 24 hour  Intake 912.85 ml  Output 1300 ml  Net -387.15 ml   Filed Weights   11/17/23 2030 11/18/23 1539  Weight: 75.3 kg 76.1 kg    Examination: General: No acute respiratory distress Lungs: Clear to auscultation bilaterally without wheezes or crackles Cardiovascular: Regular rate and rhythm without murmur gallop or rub normal S1 and S2 Abdomen: Nontender, nondistended, soft, bowel sounds positive, no rebound, no ascites, no appreciable mass Extremities: No significant cyanosis, clubbing, or edema bilateral lower extremities   CBC: Recent Labs  Lab 11/17/23 2022 11/18/23 0735  WBC 6.7 6.4  NEUTROABS 4.0  --   HGB 15.3 14.9  HCT 44.6 43.2  MCV 86.3 86.4  PLT 176 154   Basic Metabolic Panel: Recent Labs  Lab 11/17/23 2022 11/18/23 0735 11/19/23 0342  NA 137 136 137  K 4.2 3.8 3.8  CL 105 106 107  CO2 19* 20* 20*  GLUCOSE 198* 174* 188*  BUN 23 21 19   CREATININE 1.35* 1.26*  1.41*  CALCIUM  9.3 8.7* 8.8*  MG 3.8* 2.4 2.4  PHOS  --  3.1  --    GFR: Estimated Creatinine Clearance: 49.6 mL/min (A) (by C-G formula based on SCr of 1.41 mg/dL (H)).   Scheduled Meds:  aspirin  EC  81 mg Oral Daily   clopidogrel   75 mg Oral Daily   empagliflozin   10 mg Oral QAC breakfast   heparin   5,000 Units Subcutaneous Q8H   insulin  aspart  0-6 Units Subcutaneous TID WC   metoprolol   succinate  50 mg Oral QHS   multivitamin with minerals  1 tablet Oral Daily   rosuvastatin   40 mg Oral Daily   sacubitril -valsartan   1 tablet Oral BID   spironolactone   12.5 mg Oral Daily   tamsulosin   0.4 mg Oral QPC supper   Continuous Infusions:  amiodarone  30 mg/hr (11/19/23 0334)     LOS: 2 days   Reyes IVAR Moores, MD [facility] Office  [phone] Pager - Text Page per Tracey  If 7PM-7AM, please contact night-coverage per Amion 11/19/2023, 7:56 AM

## 2023-11-19 NOTE — Progress Notes (Addendum)
 [name]. Date of Encounter: 11/19/2023  Primary Cardiologist: Lonni Cash, MD Electrophysiologist: None  Interval Summary   NAEON, transferred to unit.  He had a short episode of chest pain overnight that self-resolved. Was very brief in duration.  No palpitations or SOB.  Vital Signs    Vitals:   11/18/23 2320 11/19/23 0300 11/19/23 0439 11/19/23 0756  BP: 106/69 117/72 93/69 116/69  Pulse: 80  71 77  Resp: 19 15 14 17   Temp: 98.3 F (36.8 C)  97.8 F (36.6 C) 97.6 F (36.4 C)  TempSrc: Oral  Oral Axillary  SpO2: 98%  97% 97%  Weight:      Height:        Intake/Output Summary (Last 24 hours) at 11/19/2023 0938 Last data filed at 11/19/2023 0441 Gross per 24 hour  Intake 912.85 ml  Output 1300 ml  Net -387.15 ml   Filed Weights   11/17/23 2030 11/18/23 1539  Weight: 75.3 kg 76.1 kg    Physical Exam    GEN- NAD, Alert and oriented  Lungs- Clear to ausculation bilaterally, normal work of breathing Cardiac- Regular rate and rhythm, no murmurs, rubs or gallops GI- soft, NT, ND, + BS Extremities- no clubbing or cyanosis. No edema  Telemetry    SR 60-80s with rare PVC (personally reviewed)  Hospital Course    Jonathan Cervantes. is a 73 y.o. male with a history of CAD s/p recent PCI to LAD (10/2023), HFrEF, HTN, HLD, T2DM, BPH admitted for VT.   Assessment & Plan    #) VT #) PVC #) CAD s/p recent PCI #) HFrEF  VT quiescent on IV amiodarone , PVC burden much improved Will transition to PO amiodarone , 400mg  BID Planning for cMRI hopefully today to eval scar burden Discussed ICD implant, patient is agreeable NPO in case able to implant today      For questions or updates, please contact [facility] Please consult [email or web address] for contact info under     Signed, Suzann Riddle, NP  11/19/2023, 9:38 AM    I have seen, examined the patient, and reviewed the above assessment and plan.    Interval:  No acute  overnight events. Patient reports feeling relatively well. No new or acute complaints.   Ectopy reduced with IV amiodarone . No sustained runs of [place] .  General: Well developed, in no acute distress.  Neck: No JVD.  Cardiac: Normal rate, regular rhythm.  Resp: Normal work of breathing.  Ext: No edema.  Neuro: No gross focal deficits.  Psych: Normal affect.   Assessment:  Jonathan Cervantes is a 73 year old male with multivessel CAD status post recent LAD PCI, systolic heart failure who presented with palpitations and hypotension and was found to have monomorphic ventricular tachycardia.  He does not appear to be in decompensated heart failure.  Electrolytes were normal.  Troponins normal.    He had a recent admission a few weeks ago for newly diagnosed systolic heart failure.  During that hospitalization left heart catheterization was performed which showed high-grade stenosis of the proximal LAD as well as severe diffuse disease of the circumflex and an occluded RCA.  He was turned down for CABG and underwent PCI to the LAD on July 3.  He now presents with ventricular tachycardia which I suspect is scar mediated in the setting of his significant coronary disease.  Occlusion of circumflex and RCA appear to be chronic, but given his VT I query if there was a  missed MI preceding his last hospital stay.  Currently there are no obvious signs of acute ischemia.   #Ventricular tachycardia #Multifocal PVCs #Multivessel CAD #HFrEF #Ischemic cardiomyopathy   Plan:  - Transition to oral amiodaorne - 400mg  BID x 7d, 200mg  BID x7d, 200mg  daily - Cardiac MRI has been ordered, anticipate completion tomorrow. Will assess EF and scar burden.  - Discussed with patient that he meets criteria for defibrillator therapy in setting of sustained ventricular tachycardia and ischemic cardiomyopathy.  We will await final results of cardiac MRI and make final decision regarding timing.  Fonda Kitty, MD 11/19/2023 11:32  PM

## 2023-11-19 NOTE — Plan of Care (Signed)
  Problem: Coping: Goal: Ability to adjust to condition or change in health will improve Outcome: Progressing   Problem: Fluid Volume: Goal: Ability to maintain a balanced intake and output will improve Outcome: Progressing   Problem: Health Behavior/Discharge Planning: Goal: Ability to manage health-related needs will improve Outcome: Progressing   Problem: Metabolic: Goal: Ability to maintain appropriate glucose levels will improve Outcome: Progressing   Problem: Tissue Perfusion: Goal: Adequacy of tissue perfusion will improve Outcome: Progressing   Problem: Health Behavior/Discharge Planning: Goal: Ability to manage health-related needs will improve Outcome: Progressing

## 2023-11-20 ENCOUNTER — Inpatient Hospital Stay (HOSPITAL_COMMUNITY)
Admission: EM | Disposition: A | Discharge status: Home / Self Care | Payer: Self-pay | Source: Home / Self Care | Attending: Internal Medicine

## 2023-11-20 ENCOUNTER — Encounter (HOSPITAL_COMMUNITY): Payer: Self-pay | Admitting: Anesthesiology

## 2023-11-20 ENCOUNTER — Inpatient Hospital Stay (HOSPITAL_COMMUNITY)

## 2023-11-20 DIAGNOSIS — I5042 Chronic combined systolic (congestive) and diastolic (congestive) heart failure: Secondary | ICD-10-CM

## 2023-11-20 DIAGNOSIS — I471 Supraventricular tachycardia, unspecified: Secondary | ICD-10-CM | POA: Diagnosis not present

## 2023-11-20 DIAGNOSIS — I255 Ischemic cardiomyopathy: Secondary | ICD-10-CM

## 2023-11-20 HISTORY — PX: ICD IMPLANT: EP1208

## 2023-11-20 LAB — BASIC METABOLIC PANEL WITH GFR
Anion gap: 9 (ref 5–15)
BUN: 24 mg/dL — ABNORMAL HIGH (ref 8–23)
CO2: 23 mmol/L (ref 22–32)
Calcium: 8.9 mg/dL (ref 8.9–10.3)
Chloride: 106 mmol/L (ref 98–111)
Creatinine, Ser: 1.56 mg/dL — ABNORMAL HIGH (ref 0.61–1.24)
GFR, Estimated: 47 mL/min — ABNORMAL LOW (ref >60)
Glucose, Bld: 215 mg/dL — ABNORMAL HIGH (ref 70–99)
Potassium: 4 mmol/L (ref 3.5–5.1)
Sodium: 138 mmol/L (ref 135–145)

## 2023-11-20 LAB — SURGICAL PCR SCREEN

## 2023-11-20 LAB — GLUCOSE, CAPILLARY
Glucose-Capillary: 195 mg/dL — ABNORMAL HIGH (ref 70–99)
Glucose-Capillary: 257 mg/dL — ABNORMAL HIGH (ref 70–99)
Glucose-Capillary: 131 mg/dL — ABNORMAL HIGH (ref 70–99)
Glucose-Capillary: 264 mg/dL — ABNORMAL HIGH (ref 70–99)

## 2023-11-20 SURGERY — ICD IMPLANT

## 2023-11-20 MED ORDER — GENTAMICIN SULFATE 40 MG/ML IJ SOLN
INTRAMUSCULAR | Status: AC
  Administered 2023-11-20: 80 mg
  Filled 2023-11-20: qty 2

## 2023-11-20 MED ORDER — LIDOCAINE HCL 1 % IJ SOLN
INTRAMUSCULAR | Status: AC
  Filled 2023-11-20: qty 60

## 2023-11-20 MED ORDER — SODIUM CHLORIDE 0.9% FLUSH
3.0000 mL | Freq: Two times a day (BID) | INTRAVENOUS | Status: DC
  Administered 2023-11-20: 3 mL via INTRAVENOUS

## 2023-11-20 MED ORDER — FENTANYL CITRATE (PF) 100 MCG/2ML IJ SOLN
INTRAMUSCULAR | Status: DC | PRN
  Administered 2023-11-20 (×2): 25 ug via INTRAVENOUS

## 2023-11-20 MED ORDER — SODIUM CHLORIDE 0.9% FLUSH: 3.0000 mL | INTRAVENOUS | Status: DC | PRN

## 2023-11-20 MED ORDER — CEFAZOLIN SODIUM-DEXTROSE 2-4 GM/100ML-% IV SOLN: 2.0000 g | INTRAVENOUS | Status: AC

## 2023-11-20 MED ORDER — SODIUM CHLORIDE 0.9 % IV SOLN: 80.0000 mg | INTRAVENOUS | Status: AC

## 2023-11-20 MED ORDER — CEFAZOLIN SODIUM-DEXTROSE 2-4 GM/100ML-% IV SOLN
INTRAVENOUS | Status: AC
  Administered 2023-11-20: 2 g via INTRAVENOUS
  Filled 2023-11-20: qty 100

## 2023-11-20 MED ORDER — SODIUM CHLORIDE 0.9 % IV BOLUS: 250.0000 mL | Freq: Once | INTRAVENOUS | Status: DC

## 2023-11-20 MED ORDER — FENTANYL CITRATE (PF) 100 MCG/2ML IJ SOLN
INTRAMUSCULAR | Status: AC
Start: 2023-11-20 — End: 2023-11-20
  Filled 2023-11-20: qty 2

## 2023-11-20 MED ORDER — CHLORHEXIDINE GLUCONATE 4 % EX SOLN
60.0000 mL | Freq: Once | CUTANEOUS | Status: AC
  Administered 2023-11-20: 4 via TOPICAL

## 2023-11-20 MED ORDER — MIDAZOLAM HCL 5 MG/5ML IJ SOLN
INTRAMUSCULAR | Status: DC | PRN
  Administered 2023-11-20 (×2): 1 mg via INTRAVENOUS

## 2023-11-20 MED ORDER — SODIUM CHLORIDE 0.9 % IV SOLN: INTRAVENOUS | Status: DC

## 2023-11-20 MED ORDER — HEPARIN (PORCINE) IN NACL 1000-0.9 UT/500ML-% IV SOLN
INTRAVENOUS | Status: DC | PRN
  Administered 2023-11-20: 500 mL

## 2023-11-20 MED ORDER — SODIUM CHLORIDE 0.9 % IV SOLN: 250.0000 mL | INTRAVENOUS | Status: DC

## 2023-11-20 MED ORDER — LIDOCAINE HCL (PF) 1 % IJ SOLN
INTRAMUSCULAR | Status: DC | PRN
  Administered 2023-11-20: 45 mL

## 2023-11-20 MED ORDER — MIDAZOLAM HCL 2 MG/2ML IJ SOLN
INTRAMUSCULAR | Status: AC
  Filled 2023-11-20: qty 2

## 2023-11-20 MED ORDER — LACTATED RINGERS IV BOLUS
250.0000 mL | Freq: Once | INTRAVENOUS | Status: AC
  Administered 2023-11-20: 250 mL via INTRAVENOUS

## 2023-11-20 SURGICAL SUPPLY — 7 items
CABLE SURGICAL S-101-97-12 (CABLE) ×1 IMPLANT
ICD COBALT XT VR DVPA2D4 (ICD Generator) IMPLANT
LEAD SPRINT QUAT SEC 6935M-62 (Lead) IMPLANT
PAD DEFIB RADIO PHYSIO CONN (PAD) ×1 IMPLANT
SHEATH 9FR PRELUDE SNAP 13 (SHEATH) IMPLANT
SHEATH PROBE COVER 6X72 (BAG) IMPLANT
TRAY PACEMAKER INSERTION (PACKS) ×1 IMPLANT

## 2023-11-20 NOTE — Progress Notes (Addendum)
  [name]. Date of Encounter: 11/20/2023  Primary Cardiologist: Lonni Cash, MD Electrophysiologist: None  Interval Summary   NAEON.  No further chest discomfort episodes.   cMRI completed yesterday showed significant scar burden, but with some possible areas of viability.    Vital Signs    Vitals:   11/19/23 2346 11/20/23 0049 11/20/23 0616 11/20/23 0749  BP: (!) 96/57 (!) 104/58 113/70 103/63  Pulse:  73 76   Resp: 18 20 20 18   Temp: 97.7 F (36.5 C)  98.3 F (36.8 C) 98.5 F (36.9 C)  TempSrc: Oral  Oral Oral  SpO2: 95% 95% 97% 97%  Weight:   74.4 kg   Height:        Intake/Output Summary (Last 24 hours) at 11/20/2023 0908 Last data filed at 11/19/2023 2100 Gross per 24 hour  Intake 737.14 ml  Output 450 ml  Net 287.14 ml   Filed Weights   11/17/23 2030 11/18/23 1539 11/20/23 0616  Weight: 75.3 kg 76.1 kg 74.4 kg    Physical Exam    GEN- NAD, Alert and oriented  Lungs- Clear to ausculation bilaterally, normal work of breathing Cardiac- Regular rate and rhythm, no murmurs, rubs or gallops GI- soft, NT, ND, + BS Extremities- no clubbing or cyanosis. No edema  Telemetry    SR 60-80s with frequent PVC (personally reviewed)  Hospital Course    Adarius Tigges. is a 73 y.o. male with a history of CAD s/p recent PCI to LAD (10/2023), HFrEF, HTN, HLD, T2DM, BPH admitted for VT.   Assessment & Plan    #) VT #) PVC #) CAD s/p recent PCI #) HFrEF  VT quiescent on IV amiodarone , transitioned to PO yesterday PVC burden increased, but no NSVT runs Continue 400mg  amio BID x 7 days, then reduce to 200mg  BID x 7 days,t hen 200mg  daily Discussed ICD implant today, patient is agreeable to procedure Clear liquid diet now, NPO at noon for procedure late-afternoon    For questions or updates, please contact [facility] Please consult [email or web address] for contact info under     Signed, Suzann Riddle, NP  11/20/2023, 9:08 AM     I have seen and examined this patient with Elvie Needle.  Agree with above, note added to reflect my findings.  Patient feeling well this morning.  Has been transition to p.o. amiodarone .  Cardiac MRI with significant scar burden.  No obvious interventional options.  Patient without acute complaints.  GEN: No acute distress.   Neck: No JVD Cardiac: RRR, no murmurs, rubs, or gallops.  Respiratory: normal BS bases bilaterally. GI: Soft, nontender, non-distended  MS: No edema; No deformity. Neuro:  Nonfocal  Skin: warm and dry Psych: Normal affect    Ventricular tachycardia PVCs Coronary artery disease Chronic systolic heart failure Patient presented to the hospital with ventricular tachycardia.  He Jahzir Strohmeier require ICD prior to discharge.  Risks and benefits of ICD therapy were discussed.  The patient understands these risks and is agreed to the procedure.  Aleshka Corney plan for ICD implant today.  Explained risks, benefits, and alternatives to ICD implantation, including but not limited to bleeding, infection, pneumothorax, pericardial effusion, lead dislodgement, heart attack, stroke, or death.  Pt verbalized understanding and agrees to proceed.   Rayjon Wery M. Freeda Spivey MD 11/20/2023 10:55 AM

## 2023-11-20 NOTE — Plan of Care (Signed)
  Problem: Education: Goal: Ability to describe self-care measures that may prevent or decrease complications (Diabetes Survival Skills Education) will improve Outcome: Progressing   Problem: Coping: Goal: Ability to adjust to condition or change in health will improve Outcome: Progressing   Problem: Health Behavior/Discharge Planning: Goal: Ability to manage health-related needs will improve Outcome: Progressing   Problem: Metabolic: Goal: Ability to maintain appropriate glucose levels will improve Outcome: Progressing   Problem: Education: Goal: Knowledge of General Education information will improve Description: Including pain rating scale, medication(s)/side effects and non-pharmacologic comfort measures Outcome: Progressing   Problem: Health Behavior/Discharge Planning: Goal: Ability to manage health-related needs will improve Outcome: Progressing

## 2023-11-20 NOTE — Plan of Care (Signed)
 Pt is stable hemodynamically, NSR on the monitor, afebrile, normal respiratory effort, no acute distress noted overnight. Pt is able to rest well with no major complaints. Plan of care is reviewed. Pt has been progressing. We will continue to monitor.  Problem: Coping: Goal: Ability to adjust to condition or change in health will improve Outcome: Progressing   Problem: Fluid Volume: Goal: Ability to maintain a balanced intake and output will improve Outcome: Progressing   Problem: Metabolic: Goal: Ability to maintain appropriate glucose levels will improve Outcome: Progressing   Problem: Skin Integrity: Goal: Risk for impaired skin integrity will decrease Outcome: Progressing   Problem: Tissue Perfusion: Goal: Adequacy of tissue perfusion will improve Outcome: Progressing   Problem: Clinical Measurements: Goal: Ability to maintain clinical measurements within normal limits will improve Outcome: Progressing Goal: Will remain free from infection Outcome: Progressing Goal: Diagnostic test results will improve Outcome: Progressing Goal: Respiratory complications will improve Outcome: Progressing Goal: Cardiovascular complication will be avoided Outcome: Progressing   Problem: Activity: Goal: Risk for activity intolerance will decrease Outcome: Progressing   Wendi Dash, RN

## 2023-11-20 NOTE — Progress Notes (Signed)
 Jonathan Cervantes.  FMW:[id number] DOB: 02/24/1951 DOA: 11/17/2023 PCP: Elio Hunter HERO, CRNP    Brief Narrative:  73 year old with a history of CAD with PCI to LAD 10/21/2023, ischemic systolic CHF with EF 30-35%, HTN, HLD, DM2, and BPH who was brought to the ER via EMS with palpitations chest pressure and left neck pain.  In the field EMS reported a 30 to 40-second run of V. tach as well as atrial fibrillation.  He was dosed with amiodarone  and magnesium.  In the ER he was seen by Cardiology who diagnosed him with wide-complex tachycardia with A-V dissociation which was thought to be related to a cardiac scar.  An amiodarone  drip was initiated and he was admitted for an EP evaluation.  Goals of Care:   Code Status: Full Code   DVT prophylaxis: heparin  injection 5,000 Units Start: 11/18/23 1400   Interim Hx: No acute events recorded since the time of my last visit.  Has been transitioned to oral amiodarone .  Resting comfortably in bed.  Has no new complaints.  Tells me that the lung nodule has been appreciated previously and is followed on a regular outpatient schedule.  Assessment & Plan:  Monomorphic ventricular tachycardia Care per EP -continue amiodarone  - continue beta-blocker - cardiac MRI completed and interpreted by cardiology - implantable defibrillator to be implanted today  CAD status post PCI to LAD 10/21/2023 -chronic occlusion of RCA and circumflex (deemed inappropriate for CABG) Continue aspirin  Plavix  and metoprolol   Chronic ischemic systolic CHF EF 30-35% with grade 1 DD via TTE 10/20/2023 - compensated at present -monitoring fluid balance and weights -on Jardiance  Lasix  Aldactone  Entresto  and metoprolol  at home - net negative approximately 1 L since admission - hold diuretics and Entresto  today in setting of slowly climbing creatinine  Filed Weights   11/17/23 2030 11/18/23 1539 11/20/23 0616  Weight: 75.3 kg 76.1 kg 74.4 kg    CKD stage II Review of records suggest  baseline creatinine is 1.15-1.35 with GFR typically >60 -creatinine trending upward slightly but not yet reaching level of AKI -continue to monitor  Recent Labs  Lab 11/16/23 0916 11/17/23 2022 11/18/23 0735 11/19/23 0342 11/20/23 0310  CREATININE 1.29* 1.35* 1.26* 1.41* 1.56*    2 cm right lower lobe pulmonary nodule Incidentally noted on cardiac MRI -obtain dedicated CT chest without contrast for further characterization -is not a new finding and has not previously been evaluated as an outpatient  HTN Blood pressure well-controlled /borderline low -holding diuretics and Entresto   HLD Continue usual Crestor  dose  DM2 Continue usual Jardiance  -holding usual metformin  while hospitalized - CBG well-controlled  BPH Continue usual Flomax  - asymptomatic presently  Family Communication: Spoke with wife at bedside Disposition: Anticipate discharge home 8/2   Objective: Blood pressure 103/63, pulse 76, temperature 98.5 F (36.9 C), temperature source Oral, resp. rate 18, height 5' 10 (1.778 m), weight 74.4 kg, SpO2 97%.  Intake/Output Summary (Last 24 hours) at 11/20/2023 0806 Last data filed at 11/19/2023 2100 Gross per 24 hour  Intake 997.14 ml  Output 450 ml  Net 547.14 ml   Filed Weights   11/17/23 2030 11/18/23 1539 11/20/23 0616  Weight: 75.3 kg 76.1 kg 74.4 kg    Examination: General: No acute respiratory distress Lungs: Clear to auscultation bilaterally without wheezes or crackles Cardiovascular: Regular rate and rhythm without murmur gallop or rub normal S1 and S2 Abdomen: Nontender, nondistended, soft, bowel sounds positive, no rebound, no ascites, no appreciable mass Extremities: No significant cyanosis,  clubbing, or edema bilateral lower extremities   CBC: Recent Labs  Lab 11/17/23 2022 11/18/23 0735  WBC 6.7 6.4  NEUTROABS 4.0  --   HGB 15.3 14.9  HCT 44.6 43.2  MCV 86.3 86.4  PLT 176 154   Basic Metabolic Panel: Recent Labs  Lab 11/17/23 2022  11/18/23 0735 11/19/23 0342 11/20/23 0310  NA 137 136 137 138  K 4.2 3.8 3.8 4.0  CL 105 106 107 106  CO2 19* 20* 20* 23  GLUCOSE 198* 174* 188* 215*  BUN 23 21 19  24*  CREATININE 1.35* 1.26* 1.41* 1.56*  CALCIUM  9.3 8.7* 8.8* 8.9  MG 3.8* 2.4 2.4  --   PHOS  --  3.1  --   --    GFR: Estimated Creatinine Clearance: 44.8 mL/min (A) (by C-G formula based on SCr of 1.56 mg/dL (H)).   Scheduled Meds:  amiodarone   400 mg Oral BID   aspirin  EC  81 mg Oral Daily   clopidogrel   75 mg Oral Daily   empagliflozin   10 mg Oral QAC breakfast   heparin   5,000 Units Subcutaneous Q8H   insulin  aspart  0-6 Units Subcutaneous TID WC   metoprolol  succinate  50 mg Oral QHS   multivitamin with minerals  1 tablet Oral Daily   rosuvastatin   40 mg Oral Daily   sacubitril -valsartan   1 tablet Oral BID   spironolactone   12.5 mg Oral Daily   tamsulosin   0.4 mg Oral QPC supper      LOS: 3 days   Jonathan IVAR Moores, MD [facility] Office  [phone] Pager - Text Page per Tracey  If 7PM-7AM, please contact night-coverage per Amion 11/20/2023, 8:06 AM

## 2023-11-20 NOTE — Care Management Important Message (Signed)
 Important Message  Patient Details  Name: Jonathan Cervantes. MRN: [id number] Date of Birth: 1950-05-24   Important Message Given:  Yes - Medicare IM     Claretta Deed 11/20/2023, 2:59 PM

## 2023-11-20 NOTE — Discharge Instructions (Signed)
 After Your ICD (Implantable Cardiac Defibrillator)   You have a [employer] ICD  ACTIVITY Do not lift your arm above shoulder height for 1 week after your procedure. After 7 days, you may progress as below.  You should remove your sling 24 hours after your procedure, unless otherwise instructed by your provider.     Friday November 27, 2023  Saturday November 28, 2023 Sunday November 29, 2023 Monday November 30, 2023   Do not lift, push, pull, or carry anything over 10 pounds with the affected arm until 6 weeks (Friday January 01, 2024 ) after your procedure.   You may drive AFTER your wound check, unless you have been told otherwise by your provider.   Ask your healthcare provider when you can go back to work   INCISION/Dressing If you are on a blood thinner such as Coumadin, Xarelto, Eliquis, Plavix , or Pradaxa please confirm with your provider when this should be resumed.   If large square, outer bandage is left in place, this can be removed after 24 hours from your procedure. Do not remove steri-strips or glue as below.   Monitor your defibrillator site for redness, swelling, and drainage. Call the device clinic at [phone] if you experience these symptoms or fever/chills.  If your incision is sealed with Steri-strips or staples, you may shower 7 days after your procedure or when told by your provider. Do not remove the steri-strips or let the shower hit directly on your site. You may wash around your site with soap and water.    If you were discharged in a sling, please do not wear this during the day more than 48 hours after your surgery unless otherwise instructed. This may increase the risk of stiffness and soreness in your shoulder.   Avoid lotions, ointments, or perfumes over your incision until it is well-healed.  You may use a hot tub or a pool AFTER your wound check appointment if the incision is completely closed.  Your ICD is designed to protect you from life threatening  heart rhythms. Because of this, you may receive a shock.   1 shock with no symptoms:  Call the office during business hours. 1 shock with symptoms (chest pain, chest pressure, dizziness, lightheadedness, shortness of breath, overall feeling unwell):  Call 911. If you experience 2 or more shocks in 24 hours:  Call 911. If you receive a shock, you should not drive for 6 months per the [place] DMV IF you receive appropriate therapy from your ICD.   ICD Alerts:  Some alerts are vibratory and others beep. These are NOT emergencies. Please call our office to let us  know. If this occurs at night or on weekends, it can wait until the next business day. Send a remote transmission.  If your device is capable of reading fluid status (for heart failure), you will be offered monthly monitoring to review this with you.   DEVICE MANAGEMENT Remote monitoring is used to monitor your ICD from home. This monitoring is scheduled every 91 days by our office. It allows us  to keep an eye on the functioning of your device to ensure it is working properly. You will routinely see your Electrophysiologist annually (more often if necessary).   You should receive your ID card for your new device in 4-8 weeks. Keep this card with you at all times once received. Consider wearing a medical alert bracelet or necklace.  Your ICD  may be MRI compatible. This will be discussed at your next  office visit/wound check.  You should avoid contact with strong electric or magnetic fields.   Do not use amateur (ham) radio equipment or electric (arc) welding torches. MP3 player headphones with magnets should not be used. Some devices are safe to use if held at least 12 inches (30 cm) from your defibrillator. These include power tools, lawn mowers, and speakers. If you are unsure if something is safe to use, ask your health care provider.  When using your cell phone, hold it to the ear that is on the opposite side from the defibrillator. Do not  leave your cell phone in a pocket over the defibrillator.  You may safely use electric blankets, heating pads, computers, and microwave ovens.  Call the office right away if: You have chest pain. You feel more than one shock. You feel more short of breath than you have felt before. You feel more light-headed than you have felt before. Your incision starts to open up.  This information is not intended to replace advice given to you by your health care provider. Make sure you discuss any questions you have with your health care provider.

## 2023-11-20 NOTE — Progress Notes (Signed)
 Left forearm iv dcd, site red, patient c/o pain.no hematoma. No drainage.pressure drsg applied.

## 2023-11-21 ENCOUNTER — Encounter (HOSPITAL_COMMUNITY): Payer: Self-pay | Admitting: Cardiology

## 2023-11-21 ENCOUNTER — Other Ambulatory Visit (HOSPITAL_COMMUNITY): Payer: Self-pay

## 2023-11-21 ENCOUNTER — Inpatient Hospital Stay (HOSPITAL_COMMUNITY)

## 2023-11-21 DIAGNOSIS — I471 Supraventricular tachycardia, unspecified: Secondary | ICD-10-CM | POA: Diagnosis not present

## 2023-11-21 LAB — BASIC METABOLIC PANEL WITH GFR
Anion gap: 12 (ref 5–15)
BUN: 21 mg/dL (ref 8–23)
CO2: 22 mmol/L (ref 22–32)
Calcium: 8.8 mg/dL — ABNORMAL LOW (ref 8.9–10.3)
Chloride: 105 mmol/L (ref 98–111)
Creatinine, Ser: 1.37 mg/dL — ABNORMAL HIGH (ref 0.61–1.24)
GFR, Estimated: 55 mL/min — ABNORMAL LOW (ref >60)
Glucose, Bld: 170 mg/dL — ABNORMAL HIGH (ref 70–99)
Potassium: 3.8 mmol/L (ref 3.5–5.1)
Sodium: 139 mmol/L (ref 135–145)

## 2023-11-21 LAB — GLUCOSE, CAPILLARY
Glucose-Capillary: 221 mg/dL — ABNORMAL HIGH (ref 70–99)
Glucose-Capillary: 229 mg/dL — ABNORMAL HIGH (ref 70–99)

## 2023-11-21 MED ORDER — AMIODARONE HCL 200 MG PO TABS
ORAL_TABLET | ORAL | 0 refills | Status: DC
  Filled 2023-11-21: qty 90, 71d supply, fill #0

## 2023-11-21 MED ORDER — ACETAMINOPHEN 325 MG PO TABS: 650.0000 mg | ORAL_TABLET | Freq: Four times a day (QID) | ORAL | Status: AC | PRN

## 2023-11-21 NOTE — Discharge Summary (Signed)
 DISCHARGE SUMMARY  Jonathan Cervantes.  MR#: [id number]  DOB:[birth year]  Date of Admission: 11/17/2023 Date of Discharge: 11/21/2023  Attending Physician:Chantrell Apsey ONEIDA Moores, MD  Patient's ERE:Mzjhjw, Hunter HERO, CRNP  Disposition: Discharge home  Follow-up Appts:  Follow-up Information     [facility]. Go in 4 day(s).   Specialty: Cardiology Why: Hospital follow up 11/24/23 @ 1:30 pm PLEASE bring a current medication list to appointment Free valet parking, Entrance C, [address] Contact information: [address] [place]  [phone]                Discharge Diagnoses: Monomorphic ventricular tachycardia CAD status post PCI to LAD 10/21/2023 -chronic occlusion of RCA and circumflex (deemed Chronic ischemic systolic CHF  CKD stage II 2 cm right lower lobe pulmonary nodule HTN HLD DM2 BPH  Initial presentation: 73 year old with a history of CAD with PCI to LAD 10/21/2023, ischemic systolic CHF with EF 30-35%, HTN, HLD, DM2, right lower lobe lung nodule, and BPH who was brought to the ER via EMS with palpitations chest pressure and left neck pain. In the field EMS reported a 30 to 40-second run of V. tach as well as atrial fibrillation. He was dosed with amiodarone  and magnesium. In the ER he was seen by Cardiology who diagnosed him with wide-complex tachycardia with A-V dissociation which was thought to be related to a cardiac scar. An amiodarone  drip was initiated and he was admitted for an EP evaluation.   Hospital Course:  Monomorphic ventricular tachycardia Care per EP -continue amiodarone  - continue beta-blocker - cardiac MRI completed and interpreted by Cardiology - implantable defibrillator inserted 8/1 - cleared for d/c by EP with outpt follow up established    CAD status post PCI to LAD 10/21/2023 -chronic occlusion of RCA and circumflex (deemed inappropriate for CABG) Continue  aspirin  Plavix  and metoprolol  - no active ACS during this admit    Chronic ischemic systolic CHF EF 30-35% with grade 1 DD via TTE 10/20/2023 - compensated during this admit - monitored fluid balance and weights - on Jardiance  Lasix  Aldactone  Entresto  and metoprolol  at home - net negative approximately 1.5 L since admission - held diuretics and Entresto  8/1in setting of slowly climbing creatinine - creatinine improved 8/2 therefore GDMT resumed at time of d/c     CKD stage II Review of records suggest baseline creatinine is 1.15-1.35 with GFR typically >60 -creatinine trended upward slightly during admission but did not reach level of AKI - creatinine improved on day of d/c and is within his baseline range     2 cm right lower lobe pulmonary nodule - chronic  noted on cardiac MRI, but previously known with ongoing outpatient monitoring, to include PET in 2023 - dedicated CT chest this admission noted progressive interval growth... now measuring 2.3 x 2.0 x 2.2 cm.... Concerning for low-grade neoplasm given the interval increase in size and low-grade metabolic activity seen on prior PET scan - discussed need for ongoing surveillance with patient - he will f/u with his PCP/Oncologist for this, as has been previously scheduled (was told he will need repeat imaging Q5 years)   HTN Blood pressure well-controlled during this admission   HLD Continue usual Crestor  dose   DM2 Continue usual Jardiance  -held usual metformin  while hospitalized - CBG well-controlled   BPH Continue usual Flomax  - asymptomatic presently    Allergies as of 11/21/2023       Reactions  Egg-derived Products Anaphylaxis   Reaction to egg-based serums only, such as flu-shots manufactured using eggs.   Ms Contin [morphine] Anaphylaxis        Medication List     TAKE these medications    acetaminophen  325 MG tablet Commonly known as: TYLENOL  Take 2 tablets (650 mg total) by mouth every 6 (six) hours as needed for  mild pain (pain score 1-3), fever or headache.   albuterol  108 (90 Base) MCG/ACT inhaler Commonly known as: VENTOLIN  HFA Inhale 1-2 puffs into the lungs every 6 (six) hours as needed for wheezing or shortness of breath.   amiodarone  200 MG tablet Commonly known as: PACERONE  Take 2 tabs twice a day for 4 days, then 1 tab twice a day for 7 days, then 1 tab a day thereafter   aspirin  EC 81 MG tablet Take 1 tablet (81 mg total) by mouth daily. Swallow whole.   clopidogrel  75 MG tablet Commonly known as: PLAVIX  Take 1 tablet (75 mg total) by mouth daily.   empagliflozin  10 MG Tabs tablet Commonly known as: Jardiance  Take 1 tablet (10 mg total) by mouth daily before breakfast. What changed: when to take this   furosemide  20 MG tablet Commonly known as: Lasix  Take 1 tablet (20 mg total) by mouth as needed. For increased swelling, weight gain of 3lb in 1 day or 5lb in 1 week What changed: when to take this   metoprolol  succinate 50 MG 24 hr tablet Commonly known as: Toprol  XL Take 1 tablet (50 mg total) by mouth daily. Take with or immediately following a meal. What changed:  when to take this additional instructions   Multivitamin Men 50+ Tabs Take 1 tablet by mouth daily.   rosuvastatin  40 MG tablet Commonly known as: CRESTOR  Take 1 tablet (40 mg total) by mouth daily.   sacubitril -valsartan  24-26 MG Commonly known as: ENTRESTO  Take 1 tablet by mouth 2 (two) times daily.   spironolactone  25 MG tablet Commonly known as: ALDACTONE  Take 0.5 tablets (12.5 mg total) by mouth daily.   tamsulosin  0.4 MG Caps capsule Commonly known as: FLOMAX  Take 1 capsule (0.4 mg total) by mouth daily after supper. What changed: when to take this   VITAMIN A  PO Take 1 capsule by mouth daily.   VITAMIN E PO Take 1 capsule by mouth daily.   VITAMIN K2-VITAMIN D3 PO Take 1 tablet by mouth daily.        Day of Discharge BP 121/64 (BP Location: Right Arm)   Pulse 77   Temp 97.6 F  (36.4 C) (Oral)   Resp 17   Ht 5' 10 (1.778 m)   Wt 74.4 kg   SpO2 96%   BMI 23.53 kg/m   Physical Exam: General: No acute respiratory distress Lungs: Clear to auscultation bilaterally without wheezes or crackles Cardiovascular: Regular rate and rhythm without murmur gallop or rub normal S1 and S2 Abdomen: Nontender, nondistended, soft, bowel sounds positive, no rebound, no ascites, no appreciable mass Extremities: No significant cyanosis, clubbing, or edema bilateral lower extremities  Basic Metabolic Panel: Recent Labs  Lab 11/17/23 2022 11/18/23 0735 11/19/23 0342 11/20/23 0310 11/21/23 0226  NA 137 136 137 138 139  K 4.2 3.8 3.8 4.0 3.8  CL 105 106 107 106 105  CO2 19* 20* 20* 23 22  GLUCOSE 198* 174* 188* 215* 170*  BUN 23 21 19  24* 21  CREATININE 1.35* 1.26* 1.41* 1.56* 1.37*  CALCIUM  9.3 8.7* 8.8* 8.9 8.8*  MG 3.8*  2.4 2.4  --   --   PHOS  --  3.1  --   --   --     CBC: Recent Labs  Lab 11/17/23 2022 11/18/23 0735  WBC 6.7 6.4  NEUTROABS 4.0  --   HGB 15.3 14.9  HCT 44.6 43.2  MCV 86.3 86.4  PLT 176 154     Time spent in discharge (includes decision making & examination of pt): 35 minutes  11/21/2023, 3:22 PM   Reyes IVAR Moores, MD [facility] Office  [phone]

## 2023-11-21 NOTE — Progress Notes (Signed)
 Pt is pleasant, alert and fully oriented x 4, afebrile, stable hemodynamically, NSR on the monitor. On room air, normal respiratory effort. No acute distress noted. He is able to rest well with no major complaints. Left upper chest incision has no active drainage. Left arm movement is limited and on arm sling.  Pain is well tolerated. Plan of care is reviewed. Pt has been progressing. We will continue to monitor.   Wendi Dash, RN

## 2023-11-21 NOTE — Progress Notes (Signed)
 Progress Note  [name]. Date of Encounter: 11/21/2023 [facility] Cardiologist: Lonni Cash, MD   Interval Summary   ICD implant yesterday. No acute overnight events. Patient reports feeling relatively well. No new or acute complaints.  Vital Signs Vitals:   11/21/23 0000 11/21/23 0200 11/21/23 0348 11/21/23 0721  BP: (!) 113/57 116/61 (!) 114/99 128/80  Pulse:   81 83  Resp:   18 18  Temp:   98.2 F (36.8 C) 97.8 F (36.6 C)  TempSrc:   Oral Oral  SpO2:   96% 95%  Weight:      Height:        Intake/Output Summary (Last 24 hours) at 11/21/2023 0748 Last data filed at 11/21/2023 0354 Gross per 24 hour  Intake 490 ml  Output 1150 ml  Net -660 ml      11/20/2023    6:16 AM 11/18/2023    3:39 PM 11/17/2023    8:30 PM  Last 3 Weights  Weight (lbs) 164 lb 167 lb 12.3 oz 166 lb  Weight (kg) 74.39 kg 76.1 kg 75.297 kg      Telemetry/ECG  SR with PVCs - Personally Reviewed  Physical Exam  General: Well developed, in no acute distress.  Neck: No JVD.  Cardiac: Normal rate, regular rhythm. Left chest ICD pocket without hematoma or bleeding. Resp: Normal work of breathing.  Ext: No edema.  Neuro: No gross focal deficits.  Psych: Normal affect.   Cardiac MRI 7/31 IMPRESSION: 1. Mild LV dilatation with severe systolic dysfunction (EF 25%). Thinning/akinesis of basal to mid lateral wall   2.  Normal RV size with moderate systolic dysfunction (EF 37%)   3. Mixed cardiomyopathy with evidence of both ischemic and nonischemic etiologies. Subendocardial LGE in basal to mid lateral wall consistent with prior infarct. While LGE less than 50% transmural suggests viability, given wall thickness less than 5mm, basal to mid lateral wall appears nonviable. There is also basal septal midwall LGE, which is a scar pattern seen in nonischemic cardiomyopathies and associated with worse prognosis   4. RV insertion site LGE, which is a nonspecific  scar pattern often seen in setting of elevated pulmonary pressures   5.  Moderate mitral regurgitation (regurgitant fraction 32%)   6. Right lower lobe pulmonary nodule measuring 2cm. Recommend CT chest for further evaluation  Assessment & Plan  Mr. Secrist is a 73 year old male with multivessel CAD status post recent LAD PCI, systolic heart failure who presented with palpitations and hypotension and was found to have monomorphic ventricular tachycardia.  He does not appear to be in decompensated heart failure.  Electrolytes were normal.  Troponins normal.    He had a recent admission a few weeks ago for newly diagnosed systolic heart failure.  During that hospitalization left heart catheterization was performed which showed high-grade stenosis of the proximal LAD as well as severe diffuse disease of the circumflex and an occluded RCA.  He was turned down for CABG and underwent PCI to the LAD on July 3.  He now presents with ventricular tachycardia which I suspect is scar mediated in the setting of his significant coronary disease.  Occlusion of circumflex and RCA appear to be chronic, but given his VT I query if there was a missed MI preceding his last hospital stay.  Currently there are no obvious signs of acute ischemia.   S/p secondary prevention ICD on 8/1.   #Ventricular tachycardia #Multifocal PVCs #Multivessel CAD #HFrEF #Ischemic cardiomyopathy  Plan:  - Continue oral amiodaorne - 400mg  BID x 7d, 200mg  BID x7d, 200mg  daily.  - Continue home GDMT regimen for CHF. - CXR with appropriate lead position and no PTX by my interpretation. Final radiology read pending.  - Device interrogation with appropriate device function and stable lead parameters.  - Usual post implant teaching provided regarding activity restrictions and wound care.  - Follow up in device clinic in ~14 days.   Okay for discharge from EP perspective as long as final Radiology report of CXR does not show any  complication.    For questions or updates, please contact [facility] Please consult [email or web address] for contact info under       Signed, Fonda Kitty, MD
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
## 2023-11-24 ENCOUNTER — Encounter (HOSPITAL_COMMUNITY): Payer: Self-pay

## 2023-11-24 ENCOUNTER — Telehealth: Payer: Self-pay

## 2023-11-24 ENCOUNTER — Ambulatory Visit (HOSPITAL_COMMUNITY)
Admit: 2023-11-24 | Discharge: 2023-11-24 | Disposition: A | Discharge status: Home / Self Care | Attending: Family Medicine

## 2023-11-24 VITALS — BP 148/58 | HR 82 | Ht 70.0 in | Wt 173.4 lb

## 2023-11-24 DIAGNOSIS — I10 Essential (primary) hypertension: Secondary | ICD-10-CM | POA: Diagnosis not present

## 2023-11-24 DIAGNOSIS — Z7902 Long term (current) use of antithrombotics/antiplatelets: Secondary | ICD-10-CM | POA: Insufficient documentation

## 2023-11-24 DIAGNOSIS — Z955 Presence of coronary angioplasty implant and graft: Secondary | ICD-10-CM | POA: Insufficient documentation

## 2023-11-24 DIAGNOSIS — Z7982 Long term (current) use of aspirin: Secondary | ICD-10-CM | POA: Diagnosis not present

## 2023-11-24 DIAGNOSIS — I5022 Chronic systolic (congestive) heart failure: Secondary | ICD-10-CM | POA: Diagnosis not present

## 2023-11-24 DIAGNOSIS — Z79899 Other long term (current) drug therapy: Secondary | ICD-10-CM | POA: Diagnosis not present

## 2023-11-24 DIAGNOSIS — E119 Type 2 diabetes mellitus without complications: Secondary | ICD-10-CM | POA: Insufficient documentation

## 2023-11-24 DIAGNOSIS — I493 Ventricular premature depolarization: Secondary | ICD-10-CM | POA: Insufficient documentation

## 2023-11-24 DIAGNOSIS — E785 Hyperlipidemia, unspecified: Secondary | ICD-10-CM

## 2023-11-24 DIAGNOSIS — E1169 Type 2 diabetes mellitus with other specified complication: Secondary | ICD-10-CM

## 2023-11-24 DIAGNOSIS — I472 Ventricular tachycardia, unspecified: Secondary | ICD-10-CM | POA: Insufficient documentation

## 2023-11-24 DIAGNOSIS — I110 Hypertensive heart disease with heart failure: Secondary | ICD-10-CM | POA: Insufficient documentation

## 2023-11-24 DIAGNOSIS — I2510 Atherosclerotic heart disease of native coronary artery without angina pectoris: Secondary | ICD-10-CM | POA: Diagnosis not present

## 2023-11-24 DIAGNOSIS — Z7984 Long term (current) use of oral hypoglycemic drugs: Secondary | ICD-10-CM | POA: Insufficient documentation

## 2023-11-24 DIAGNOSIS — Z95810 Presence of automatic (implantable) cardiac defibrillator: Secondary | ICD-10-CM | POA: Diagnosis not present

## 2023-11-24 LAB — BASIC METABOLIC PANEL WITH GFR
Anion gap: 10 (ref 5–15)
BUN: 18 mg/dL (ref 8–23)
CO2: 24 mmol/L (ref 22–32)
Calcium: 9.5 mg/dL (ref 8.9–10.3)
Chloride: 107 mmol/L (ref 98–111)
Creatinine, Ser: 1.37 mg/dL — ABNORMAL HIGH (ref 0.61–1.24)
GFR, Estimated: 55 mL/min — ABNORMAL LOW (ref >60)
Glucose, Bld: 235 mg/dL — ABNORMAL HIGH (ref 70–99)
Potassium: 4.8 mmol/L (ref 3.5–5.1)
Sodium: 141 mmol/L (ref 135–145)

## 2023-11-24 LAB — BRAIN NATRIURETIC PEPTIDE: B Natriuretic Peptide: 491 pg/mL — ABNORMAL HIGH (ref 0.0–100.0)

## 2023-11-24 NOTE — Patient Instructions (Addendum)
 Good to see you today!    No changes were made to your medications  Labs done today, your results will be available in MyChart, we will contact you for abnormal readings.  Your physician recommends that you schedule a follow-up appointment 3 months(November) Call office in September to schedule an appointment  If you have any questions or concerns before your next appointment please send us  a message through [place] or call our office at [phone].    TO LEAVE A MESSAGE FOR THE NURSE SELECT OPTION 2, PLEASE LEAVE A MESSAGE INCLUDING: YOUR NAME DATE OF BIRTH CALL BACK NUMBER REASON FOR CALL**this is important as we prioritize the call backs  YOU WILL RECEIVE A CALL BACK THE SAME DAY AS LONG AS YOU CALL BEFORE 4:00 PM At the [facility], you and your health needs are our priority. As part of our continuing mission to provide you with exceptional heart care, we have created designated Provider Care Teams. These Care Teams include your primary Cardiologist (physician) and Advanced Practice Providers (APPs- Physician Assistants and Nurse Practitioners) who all work together to provide you with the care you need, when you need it.   You may see any of the following providers on your designated Care Team at your next follow up: Dr Toribio Fuel Dr Ezra Shuck Dr. Ria Commander Dr. Morene Brownie Amy Lenetta, NP Caffie Shed, [place] [facility] [place], [place] Beckey Coe, NP [place] Lee, NP Ellouise Class, NP Tinnie Redman, PharmD Jaun Bash, PharmD   Please be sure to bring in all your medications bottles to every appointment.    Thank you for choosing [facility]

## 2023-11-24 NOTE — Telephone Encounter (Addendum)
 Follow-up after same day discharge: Implant date: 11/20/2023 MD: Soyla Norton, MD Device: ICD Location: L chest   Wound check visit: yes 90 day MD follow-up: yes  Remote Transmission received:yes  Dressing/sling removed: yes  Confirm OAC restart on: n/a  Please continue to monitor your cardiac device site for redness, swelling, and drainage. Call the device clinic at [phone] if you experience these symptoms, fever/chills, or have questions about your device.   Remote monitoring is used to monitor your cardiac device from home. This monitoring is scheduled every 91 days by our office. It allows us  to keep an eye on the functioning of your device to ensure it is working properly.  Spoke with pt he is doing well

## 2023-11-25 ENCOUNTER — Encounter: Payer: Self-pay | Admitting: Emergency Medicine

## 2023-11-25 ENCOUNTER — Ambulatory Visit (HOSPITAL_COMMUNITY): Payer: Self-pay | Admitting: Family Medicine

## 2023-11-25 ENCOUNTER — Ambulatory Visit: Payer: Self-pay | Admitting: Cardiovascular Disease

## 2023-11-25 DIAGNOSIS — R002 Palpitations: Secondary | ICD-10-CM

## 2023-11-25 DIAGNOSIS — I499 Cardiac arrhythmia, unspecified: Secondary | ICD-10-CM

## 2023-11-25 MED FILL — Midazolam HCl Inj 2 MG/2ML (Base Equivalent): INTRAMUSCULAR | Qty: 2 | Status: AC

## 2023-11-26 ENCOUNTER — Ambulatory Visit: Admitting: Cardiology

## 2023-12-01 NOTE — Progress Notes (Signed)
 Cardiology Office Note:  .   Date:  12/01/2023  ID:  Jonathan Cervantes., DOB [birth year], MRN [id number] PCP: Elio Hunter HERO, CRNP  [facility] Providers Cardiologist:  Lonni Cash, MD {  History of Present Illness: .   Jonathan Cervantes. is a 73 y.o. male w/PMHx of  DM, HLD CAD > (June 2025 > CTS consult not CABG candidate > PCI to LAD) ICM, CHF ICD  June 2025 admitted with increased SOB.  HS Trop not elevated. Echo showed EF 30-35% with LV RWMA.   Cath showed multivessel CAD.  CT surgery consulted, and felt CABG not an option due to RCA distal branches and left circumflex are not graftable; they are small and diffusely diseased.  Underwent PCI to LAD with DES   Admitted July 2025 w/WCT (slow) Amio started  cMRI showing LVEF 25%, RVEF 37%, subendocardial LGE in basal to mid lateral wall consistent with prior infarct & also scar patterns associated with NICM, which carries worse prognosis; overall significant scar burden with some areas of viability.  ICD implanted  Saw the HF team 11/24/23, doing well, no symptoms, planned for f/u echo with perhaps candidacy for advanced therapies in the future if needed Noted frequent PVCs Amio continued (still on taper) Referred to c.rehab  Today's visit is scheduled as his wound check visit ROS:   He feels really well No CP, palpitations. No cardiac awareness of any kind No dizzy spells, near syncope or syncope   Device information MDT single chamber ICD implanted 11/20/23  Arrhythmia/AAD hx VT Amiodarone  started July 2025  Studies Reviewed: SABRA    EKG not done today  DEVICE interrogation done today and reviewed by myself Battery and lead measurements are good No VT Acute lead output remains   - cMRI 7/25: LVEF 25%, RVEF 37%, subendocardial LGE in basal to mid lateral wall consistent with prior infarct & also scar patterns associated with NICM, which carries worse prognosis; overall significant scar burden with  some areas of viability.    - s/p PTCA DES to mild LAD. CX lesion 2 CTO and severely diseased not amenable to stent.  (7/25)   - R/LHC 7/25: severe multivessel CAD RA 8, PA 30/20 (23), PCWP 16, CO/CI (Fick) 4/2.1   Risk Assessment/Calculations:    Physical Exam:   VS:  There were no vitals taken for this visit.   Wt Readings from Last 3 Encounters:  11/24/23 173 lb 6.4 oz (78.7 kg)  11/20/23 164 lb (74.4 kg)  11/06/23 173 lb 12.8 oz (78.8 kg)    GEN: Well nourished, well developed in no acute distress NECK: No JVD; No carotid bruits CARDIAC: RRR, no murmurs, rubs, gallops RESPIRATORY:  CTA b/l without rales, wheezing or rhonchi  ABDOMEN: Soft, non-tender, non-distended EXTREMITIES: No edema; No deformity   ICD site: steri strips are removed without difficulty, wound edges are well approximated, no erythema, edema, or heat, has a slight swelling, no overt hematoma appreciated, is soft, not changing or increasing in size by patient's report no thinning, fluctuation, tethering  ASSESSMENT AND PLAN: .    ICD intact function no programming changes made Well healed site, no signs of infection  He is on Plavix , pocket looks good, slight swelling but no overt hematoma Will have him back in 2-3 weeks re-look  VT (was slow ~ 100bpm or so) PVCs continue amiodarone  titration No PVCs noted during his device check today    CAD ICM Chronic CHF No symptoms or exam  findings of volume OL C/w Dr. Cleopatra teams    Dispo: remotes as usual, device RN visit in 2 weeks or so, usual 91 day, sooner if needed  Signed, Jonathan Macario Arthur, PA-C

## 2023-12-02 ENCOUNTER — Encounter: Payer: Self-pay | Admitting: Physician Assistant

## 2023-12-02 ENCOUNTER — Ambulatory Visit: Attending: Physician Assistant | Admitting: Physician Assistant

## 2023-12-02 VITALS — BP 100/64 | HR 76 | Ht 70.0 in | Wt 169.0 lb

## 2023-12-02 VITALS — BP 100/64 | HR 76 | Temp 98.2°F | Resp 16 | Ht 70.0 in | Wt 169.0 lb

## 2023-12-02 DIAGNOSIS — Z5189 Encounter for other specified aftercare: Secondary | ICD-10-CM

## 2023-12-02 DIAGNOSIS — I255 Ischemic cardiomyopathy: Secondary | ICD-10-CM | POA: Diagnosis not present

## 2023-12-02 DIAGNOSIS — Z95810 Presence of automatic (implantable) cardiac defibrillator: Secondary | ICD-10-CM

## 2023-12-02 DIAGNOSIS — I2510 Atherosclerotic heart disease of native coronary artery without angina pectoris: Secondary | ICD-10-CM | POA: Diagnosis not present

## 2023-12-02 DIAGNOSIS — I472 Ventricular tachycardia, unspecified: Secondary | ICD-10-CM

## 2023-12-02 DIAGNOSIS — I5022 Chronic systolic (congestive) heart failure: Secondary | ICD-10-CM

## 2023-12-02 LAB — CUP PACEART INCLINIC DEVICE CHECK
Date Time Interrogation Session: 20250813184549
Implantable Lead Connection Status: 753985
Implantable Lead Implant Date: 20250801
Implantable Lead Location: 753860
Implantable Pulse Generator Implant Date: 20250801

## 2023-12-02 NOTE — Patient Instructions (Addendum)
 Medication Instructions:   Your physician recommends that you continue on your current medications as directed. Please refer to the Current Medication list given to you today.   *If you need a refill on your cardiac medications before your next appointment, please call your pharmacy*   Lab Work: NONE ORDERED  TODAY   If you have labs (blood work) drawn today and your tests are completely normal, you will receive your results only by: MyChart Message (if you have MyChart) OR A paper copy in the mail If you have any lab test that is abnormal or we need to change your treatment, we will call you to review the results   Testing/Procedures: NONE ORDERED  TODAY    Follow-Up: At [facility], you and your health needs are our priority.  As part of our continuing mission to provide you with exceptional heart care, our providers are all part of one team.  This team includes your primary Cardiologist (physician) and Advanced Practice Providers or APPs (Physician Assistants and Nurse Practitioners) who all work together to provide you with the care you need, when you need it.  Your next appointment:   WITH DEVICE CLINIC NURSE  IN  2-3  WEEKS  FOR WOUND POCKET   AS SCHEDULED   Provider:  Soyla Norton, MD    We recommend signing up for the patient portal called MyChart.  Sign up information is provided on this After Visit Summary.  MyChart is used to connect with patients for Virtual Visits (Telemedicine).  Patients are able to view lab/test results, encounter notes, upcoming appointments, etc.  Non-urgent messages can be sent to your provider as well.   To learn more about what you can do with MyChart, go to [email or web address].   Other Instructions

## 2023-12-03 ENCOUNTER — Ambulatory Visit

## 2023-12-05 ENCOUNTER — Ambulatory Visit: Payer: Self-pay | Admitting: Cardiology

## 2023-12-09 ENCOUNTER — Telehealth (HOSPITAL_COMMUNITY): Payer: Self-pay

## 2023-12-09 NOTE — Telephone Encounter (Signed)
 Attempted to call patient to schedule cardiac rehab- no answer, left message. Sent MyChart message.

## 2023-12-10 ENCOUNTER — Ambulatory Visit (HOSPITAL_COMMUNITY)
Admission: RE | Admit: 2023-12-10 | Discharge: 2023-12-10 | Disposition: A | Discharge status: Home / Self Care | Source: Ambulatory Visit | Attending: Cardiology | Admitting: Cardiology

## 2023-12-10 DIAGNOSIS — I255 Ischemic cardiomyopathy: Secondary | ICD-10-CM

## 2023-12-10 DIAGNOSIS — I5022 Chronic systolic (congestive) heart failure: Secondary | ICD-10-CM

## 2023-12-10 LAB — ECHOCARDIOGRAM LIMITED
Area-P 1/2: 4.33 cm2
S' Lateral: 4.3 cm

## 2023-12-11 ENCOUNTER — Telehealth (HOSPITAL_COMMUNITY): Payer: Self-pay

## 2023-12-11 NOTE — Telephone Encounter (Signed)
 Patient called back to get scheduled in the Cardiac Rehab Program. Patient will come in for orientation on 9/18 and will attend the 1:15 exercise class.  Sent MyChart message.

## 2023-12-23 ENCOUNTER — Telehealth: Payer: Self-pay | Admitting: Cardiology

## 2023-12-23 NOTE — Telephone Encounter (Signed)
 Pt called in asking if his device check 9/16 can be changed since he will be out of town.

## 2023-12-25 ENCOUNTER — Other Ambulatory Visit (HOSPITAL_COMMUNITY): Payer: Self-pay

## 2024-01-05 ENCOUNTER — Ambulatory Visit (INDEPENDENT_AMBULATORY_CARE_PROVIDER_SITE_OTHER)

## 2024-01-05 DIAGNOSIS — I5022 Chronic systolic (congestive) heart failure: Secondary | ICD-10-CM | POA: Diagnosis not present

## 2024-01-06 LAB — CUP PACEART REMOTE DEVICE CHECK
Battery Remaining Longevity: 171 mo
Battery Voltage: 3.18 V
Brady Statistic RV Percent Paced: 0.01 %
Date Time Interrogation Session: 20250915225944
HighPow Impedance: 54 Ohm
Implantable Lead Connection Status: 753985
Implantable Lead Implant Date: 20250801
Implantable Lead Location: 753860
Implantable Pulse Generator Implant Date: 20250801
Lead Channel Impedance Value: 323 Ohm
Lead Channel Impedance Value: 456 Ohm
Lead Channel Pacing Threshold Amplitude: 1.125 V
Lead Channel Pacing Threshold Pulse Width: 0.4 ms
Lead Channel Sensing Intrinsic Amplitude: 13.3 mV
Lead Channel Setting Pacing Amplitude: 3.5 V
Lead Channel Setting Pacing Pulse Width: 0.4 ms
Lead Channel Setting Sensing Sensitivity: 0.3 mV

## 2024-01-06 NOTE — Telephone Encounter (Signed)
 Remote transmission was processed 01/04/24.

## 2024-01-07 ENCOUNTER — Inpatient Hospital Stay (HOSPITAL_COMMUNITY): Admission: RE | Admit: 2024-01-07 | Source: Ambulatory Visit

## 2024-01-07 ENCOUNTER — Telehealth (HOSPITAL_COMMUNITY): Payer: Self-pay | Admitting: *Deleted

## 2024-01-07 NOTE — Telephone Encounter (Signed)
 Left message to call cardiac rehab.Hadassah Elpidio Quan RN BSN

## 2024-01-09 ENCOUNTER — Ambulatory Visit: Payer: Self-pay | Admitting: Cardiology

## 2024-01-11 ENCOUNTER — Telehealth (HOSPITAL_COMMUNITY): Payer: Self-pay

## 2024-01-11 ENCOUNTER — Encounter (HOSPITAL_COMMUNITY)

## 2024-01-11 NOTE — Progress Notes (Signed)
Remote ICD Transmission.

## 2024-01-11 NOTE — Telephone Encounter (Signed)
 Patient was no call, no show for orientation. Jonathan Cervantes attempted to call, no response.  Attempted f/u call today- no answer, left message. Sent MyChart message.  Closing referral.

## 2024-01-12 ENCOUNTER — Encounter (HOSPITAL_COMMUNITY): Payer: Self-pay

## 2024-01-12 ENCOUNTER — Telehealth (HOSPITAL_COMMUNITY): Payer: Self-pay

## 2024-01-12 NOTE — Telephone Encounter (Signed)
 Pt returned phone call and would like to reschedule his cardiac rehab appts. Pt will come in for orientation on 9/30@1030  and will attend the 1:15 class time.   Sent packet.

## 2024-01-12 NOTE — Telephone Encounter (Signed)
 Patient left message stating he was returning calls regarding cardiac rehab. Attempted to call patient back- no answer, left message to call us  back.

## 2024-01-13 ENCOUNTER — Encounter (HOSPITAL_COMMUNITY)

## 2024-01-14 ENCOUNTER — Telehealth (HOSPITAL_COMMUNITY): Payer: Self-pay

## 2024-01-14 NOTE — Telephone Encounter (Addendum)
 Left message attempting to confirm cardiac rehab appointment of 1030 on 01/19/24.

## 2024-01-15 ENCOUNTER — Encounter (HOSPITAL_COMMUNITY)

## 2024-01-18 ENCOUNTER — Encounter (HOSPITAL_COMMUNITY)

## 2024-01-19 ENCOUNTER — Encounter (HOSPITAL_COMMUNITY)
Admission: RE | Admit: 2024-01-19 | Discharge: 2024-01-19 | Disposition: A | Discharge status: Home / Self Care | Source: Ambulatory Visit | Attending: Cardiovascular Disease | Admitting: Cardiovascular Disease

## 2024-01-19 VITALS — BP 124/60 | HR 74 | Ht 70.0 in | Wt 170.4 lb

## 2024-01-19 DIAGNOSIS — Z955 Presence of coronary angioplasty implant and graft: Secondary | ICD-10-CM | POA: Diagnosis present

## 2024-01-19 DIAGNOSIS — I5022 Chronic systolic (congestive) heart failure: Secondary | ICD-10-CM | POA: Insufficient documentation

## 2024-01-19 NOTE — Progress Notes (Signed)
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 discipline of the coming together of mind, breath, and body. The benefits of yoga include improved flexibility, improved range of motion, better posture and core strength, increased lung function, weight loss, and positive self-image. Yoga's heart health benefits include lowered blood pressure, healthier heart rate, decreased cholesterol and triglyceride levels, improved immune function, and reduced stress. Seek guidance from your physician and exercise physiologist before implementing an exercise routine and learn your capabilities and proper form for all exercise.  Medical   Aging: Enhancing Your Quality of Life  Clinical staff conducted group or individual video education with verbal and written material and guidebook.  Patient learns key strategies and recommendations  to stay in good physical health and enhance quality of life, such as prevention strategies, having an advocate, securing a Health Care Proxy and Power of Attorney, and keeping a list of medications and system for tracking them. It also discusses how to avoid risk for bone loss.  Biology of Weight Control  Clinical staff conducted group or individual video education with verbal and written material and guidebook.  Patient learns that weight gain occurs because we consume more calories than we burn (eating more, moving less). Even if your body weight is normal, you may have higher ratios of fat compared to muscle mass. Too much body fat puts you at increased risk for cardiovascular disease, heart attack, stroke, type 2 diabetes, and obesity-related cancers. In addition to exercise, following the Pritikin Eating Plan can help reduce your risk.  Decoding Lab Results  Clinical staff conducted group or individual video education with verbal and written material and guidebook.  Patient learns that lab test reflects one measurement whose values change over time and are influenced by many factors, including medication, stress, sleep, exercise, food, hydration, pre-existing medical conditions, and more. It is recommended to use the knowledge from this video to become more involved with your lab results and evaluate your numbers to speak with your doctor.   Diseases of Our Time - Overview  Clinical staff conducted group or individual video education with verbal and written material and guidebook.  Patient learns that according to the CDC, 50% to 70% of chronic diseases (such as obesity, type 2 diabetes, elevated lipids, hypertension, and heart disease) are avoidable through lifestyle improvements including healthier food choices, listening to satiety cues, and increased physical activity.  Sleep Disorders Clinical staff conducted group or individual video education with verbal and written material and  guidebook.  Patient learns how good quality and duration of sleep are important to overall health and well-being. Patient also learns about sleep disorders and how they impact health along with recommendations to address them, including discussing  with a physician.  Nutrition  Dining Out - Part 2 Clinical staff conducted group or individual video education with verbal and written material and guidebook.  Patient learns how to plan ahead and communicate in order to maximize their dining experience in a healthy and nutritious manner. Included are recommended food choices based on the type of restaurant the patient is visiting.   Fueling a Banker conducted group or individual video education with verbal and written material and guidebook.  There is a strong connection between our food choices and our health. Diseases like obesity and type 2 diabetes are very prevalent and are in large-part due to lifestyle choices. The Pritikin Eating Plan provides plenty of food and hunger-curbing satisfaction. It is easy to follow, affordable, and helps reduce health risks.  Menu Workshop  Clinical staff conducted group or individual video education with verbal and written material and guidebook.  Patient learns that restaurant meals can sabotage health goals because they are often packed with calories, fat, sodium, and sugar. Recommendations include strategies to plan ahead and to communicate with the manager, chef, or server to help order a healthier meal.  Planning Your Eating Strategy  Clinical staff conducted group or individual video education with verbal and written material and guidebook.  Patient learns about the Pritikin Eating Plan and its benefit of reducing the risk of disease. The Pritikin Eating Plan does not focus on calories. Instead, it emphasizes high-quality, nutrient-rich foods. By knowing the characteristics of the foods, we choose, we can determine their calorie density  and make informed decisions.  Targeting Your Nutrition Priorities  Clinical staff conducted group or individual video education with verbal and written material and guidebook.  Patient learns that lifestyle habits have a tremendous impact on disease risk and progression. This video provides eating and physical activity recommendations based on your personal health goals, such as reducing LDL cholesterol, losing weight, preventing or controlling type 2 diabetes, and reducing high blood pressure.  Vitamins and Minerals  Clinical staff conducted group or individual video education with verbal and written material and guidebook.  Patient learns different ways to obtain key vitamins and minerals, including through a recommended healthy diet. It is important to discuss all supplements you take with your doctor.   Healthy Mind-Set    Smoking Cessation  Clinical staff conducted group or individual video education with verbal and written material and guidebook.  Patient learns that cigarette smoking and tobacco addiction pose a serious health risk which affects millions of people. Stopping smoking will significantly reduce the risk of heart disease, lung disease, and many forms of cancer. Recommended strategies for quitting are covered, including working with your doctor to develop a successful plan.  Culinary   Becoming a Set designer conducted group or individual video education with verbal and written material and guidebook.  Patient learns that cooking at home can be healthy, cost-effective, quick, and puts them in control. Keys to cooking healthy recipes will include looking at your recipe, assessing your equipment needs, planning ahead, making it simple, choosing cost-effective seasonal ingredients, and limiting the use of added fats, salts, and sugars.  Cooking - Breakfast and Snacks  Clinical staff conducted group or individual video education with verbal and written material  and guidebook.  Patient learns how important breakfast is to satiety and nutrition through the entire day. Recommendations include key foods to eat during breakfast to help stabilize blood sugar levels and to prevent overeating at meals later  in the day. Planning ahead is also a key component.  Cooking - Educational psychologist conducted group or individual video education with verbal and written material and guidebook.  Patient learns eating strategies to improve overall health, including an approach to cook more at home. Recommendations include thinking of animal protein as a side on your plate rather than center stage and focusing instead on lower calorie dense options like vegetables, fruits, whole grains, and plant-based proteins, such as beans. Making sauces in large quantities to freeze for later and leaving the skin on your vegetables are also recommended to maximize your experience.  Cooking - Healthy Salads and Dressing Clinical staff conducted group or individual video education with verbal and written material and guidebook.  Patient learns that vegetables, fruits, whole grains, and legumes are the foundations of the Pritikin Eating Plan. Recommendations include how to incorporate each of these in flavorful and healthy salads, and how to create homemade salad dressings. Proper handling of ingredients is also covered. Cooking - Soups and [employer] - Soups and Desserts Clinical staff conducted group or individual video education with verbal and written material and guidebook.  Patient learns that Pritikin soups and desserts make for easy, nutritious, and delicious snacks and meal components that are low in sodium, fat, sugar, and calorie density, while high in vitamins, minerals, and filling fiber. Recommendations include simple and healthy ideas for soups and desserts.   Overview     The Pritikin Solution Program Overview Clinical staff conducted group or individual video  education with verbal and written material and guidebook.  Patient learns that the results of the Pritikin Program have been documented in more than 100 articles published in peer-reviewed journals, and the benefits include reducing risk factors for (and, in some cases, even reversing) high cholesterol, high blood pressure, type 2 diabetes, obesity, and more! An overview of the three key pillars of the Pritikin Program will be covered: eating well, doing regular exercise, and having a healthy mind-set.  WORKSHOPS  Exercise: Exercise Basics: Building Your Action Plan Clinical staff led group instruction and group discussion with PowerPoint presentation and patient guidebook. To enhance the learning environment the use of posters, models and videos may be added. At the conclusion of this workshop, patients will comprehend the difference between physical activity and exercise, as well as the benefits of incorporating both, into their routine. Patients will understand the FITT (Frequency, Intensity, Time, and Type) principle and how to use it to build an exercise action plan. In addition, safety concerns and other considerations for exercise and cardiac rehab will be addressed by the presenter. The purpose of this lesson is to promote a comprehensive and effective weekly exercise routine in order to improve patients' overall level of fitness.   Managing Heart Disease: Your Path to a Healthier Heart Clinical staff led group instruction and group discussion with PowerPoint presentation and patient guidebook. To enhance the learning environment the use of posters, models and videos may be added.At the conclusion of this workshop, patients will understand the anatomy and physiology of the heart. Additionally, they will understand how Pritikin's three pillars impact the risk factors, the progression, and the management of heart disease.  The purpose of this lesson is to provide a high-level overview of the  heart, heart disease, and how the Pritikin lifestyle positively impacts risk factors.  Exercise Biomechanics Clinical staff led group instruction and group discussion with PowerPoint presentation and patient guidebook. To enhance the learning environment the  use of posters, models and videos may be added. Patients will learn how the structural parts of their bodies function and how these functions impact their daily activities, movement, and exercise. Patients will learn how to promote a neutral spine, learn how to manage pain, and identify ways to improve their physical movement in order to promote healthy living. The purpose of this lesson is to expose patients to common physical limitations that impact physical activity. Participants will learn practical ways to adapt and manage aches and pains, and to minimize their effect on regular exercise. Patients will learn how to maintain good posture while sitting, walking, and lifting.  Balance Training and Fall Prevention  Clinical staff led group instruction and group discussion with PowerPoint presentation and patient guidebook. To enhance the learning environment the use of posters, models and videos may be added. At the conclusion of this workshop, patients will understand the importance of their sensorimotor skills (vision, proprioception, and the vestibular system) in maintaining their ability to balance as they age. Patients will apply a variety of balancing exercises that are appropriate for their current level of function. Patients will understand the common causes for poor balance, possible solutions to these problems, and ways to modify their physical environment in order to minimize their fall risk. The purpose of this lesson is to teach patients about the importance of maintaining balance as they age and ways to minimize their risk of falling.  WORKSHOPS   Nutrition:  Fueling a Ship broker led group instruction and  group discussion with PowerPoint presentation and patient guidebook. To enhance the learning environment the use of posters, models and videos may be added. Patients will review the foundational principles of the Pritikin Eating Plan and understand what constitutes a serving size in each of the food groups. Patients will also learn Pritikin-friendly foods that are better choices when away from home and review make-ahead meal and snack options. Calorie density will be reviewed and applied to three nutrition priorities: weight maintenance, weight loss, and weight gain. The purpose of this lesson is to reinforce (in a group setting) the key concepts around what patients are recommended to eat and how to apply these guidelines when away from home by planning and selecting Pritikin-friendly options. Patients will understand how calorie density may be adjusted for different weight management goals.  Mindful Eating  Clinical staff led group instruction and group discussion with PowerPoint presentation and patient guidebook. To enhance the learning environment the use of posters, models and videos may be added. Patients will briefly review the concepts of the Pritikin Eating Plan and the importance of low-calorie dense foods. The concept of mindful eating will be introduced as well as the importance of paying attention to internal hunger signals. Triggers for non-hunger eating and techniques for dealing with triggers will be explored. The purpose of this lesson is to provide patients with the opportunity to review the basic principles of the Pritikin Eating Plan, discuss the value of eating mindfully and how to measure internal cues of hunger and fullness using the Hunger Scale. Patients will also discuss reasons for non-hunger eating and learn strategies to use for controlling emotional eating.  Targeting Your Nutrition Priorities Clinical staff led group instruction and group discussion with PowerPoint presentation  and patient guidebook. To enhance the learning environment the use of posters, models and videos may be added. Patients will learn how to determine their genetic susceptibility to disease by reviewing their family history. Patients will gain insight into  the importance of diet as part of an overall healthy lifestyle in mitigating the impact of genetics and other environmental insults. The purpose of this lesson is to provide patients with the opportunity to assess their personal nutrition priorities by looking at their family history, their own health history and current risk factors. Patients will also be able to discuss ways of prioritizing and modifying the Pritikin Eating Plan for their highest risk areas  Menu  Clinical staff led group instruction and group discussion with PowerPoint presentation and patient guidebook. To enhance the learning environment the use of posters, models and videos may be added. Using menus brought in from [employer], or printed from [employer], patients will apply the Pritikin dining out guidelines that were presented in the [employer] video. Patients will also be able to practice these guidelines in a variety of provided scenarios. The purpose of this lesson is to provide patients with the opportunity to practice hands-on learning of the Pritikin Dining Out guidelines with actual menus and practice scenarios.  Label Reading Clinical staff led group instruction and group discussion with PowerPoint presentation and patient guidebook. To enhance the learning environment the use of posters, models and videos may be added. Patients will review and discuss the Pritikin label reading guidelines presented in Pritikin's Label Reading Educational series video. Using fool labels brought in from local grocery stores and markets, patients will apply the label reading guidelines and determine if the packaged food meet the Pritikin guidelines. The purpose of  this lesson is to provide patients with the opportunity to review, discuss, and practice hands-on learning of the Pritikin Label Reading guidelines with actual packaged food labels. Cooking School  Pritikin's [employer] are designed to teach patients ways to prepare quick, simple, and affordable recipes at home. The importance of nutrition's role in chronic disease risk reduction is reflected in its emphasis in the overall Pritikin program. By learning how to prepare essential core Pritikin Eating Plan recipes, patients will increase control over what they eat; be able to customize the flavor of foods without the use of added salt, sugar, or fat; and improve the quality of the food they consume. By learning a set of core recipes which are easily assembled, quickly prepared, and affordable, patients are more likely to prepare more healthy foods at home. These workshops focus on convenient breakfasts, simple entres, side dishes, and desserts which can be prepared with minimal effort and are consistent with nutrition recommendations for cardiovascular risk reduction. Cooking [employer] are taught by a Armed forces logistics/support/administrative officer (RD) who has been trained by the [employer]. The chef or RD has a clear understanding of the importance of minimizing - if not completely eliminating - added fat, sugar, and sodium in recipes. Throughout the series of Cooking School Workshop sessions, patients will learn about healthy ingredients and efficient methods of cooking to build confidence in their capability to prepare    Cooking School weekly topics:  Adding Flavor- Sodium-Free  Fast and Healthy Breakfasts  Powerhouse Plant-Based Proteins  Satisfying Salads and Dressings  Simple Sides and Sauces  International Cuisine-Spotlight on the [employer] Zones  Delicious Desserts  Savory Soups  [employer] - Meals in a Astronomer Appetizers and Snacks  Comforting Weekend  Breakfasts  One-Pot Wonders   Fast Evening Meals  Landscape architect Your Pritikin Plate  WORKSHOPS   Healthy Mindset (Psychosocial):  Focused Goals, Sustainable Changes Clinical staff led group instruction and  group discussion with PowerPoint presentation and patient guidebook. To enhance the learning environment the use of posters, models and videos may be added. Patients will be able to apply effective goal setting strategies to establish at least one personal goal, and then take consistent, meaningful action toward that goal. They will learn to identify common barriers to achieving personal goals and develop strategies to overcome them. Patients will also gain an understanding of how our mind-set can impact our ability to achieve goals and the importance of cultivating a positive and growth-oriented mind-set. The purpose of this lesson is to provide patients with a deeper understanding of how to set and achieve personal goals, as well as the tools and strategies needed to overcome common obstacles which may arise along the way.  From Head to Heart: The Power of a Healthy Outlook  Clinical staff led group instruction and group discussion with PowerPoint presentation and patient guidebook. To enhance the learning environment the use of posters, models and videos may be added. Patients will be able to recognize and describe the impact of emotions and mood on physical health. They will discover the importance of self-care and explore self-care practices which may work for them. Patients will also learn how to utilize the 4 C's to cultivate a healthier outlook and better manage stress and challenges. The purpose of this lesson is to demonstrate to patients how a healthy outlook is an essential part of maintaining good health, especially as they continue their cardiac rehab journey.  Healthy Sleep for a Healthy Heart Clinical staff led group instruction and group discussion with  PowerPoint presentation and patient guidebook. To enhance the learning environment the use of posters, models and videos may be added. At the conclusion of this workshop, patients will be able to demonstrate knowledge of the importance of sleep to overall health, well-being, and quality of life. They will understand the symptoms of, and treatments for, common sleep disorders. Patients will also be able to identify daytime and nighttime behaviors which impact sleep, and they will be able to apply these tools to help manage sleep-related challenges. The purpose of this lesson is to provide patients with a general overview of sleep and outline the importance of quality sleep. Patients will learn about a few of the most common sleep disorders. Patients will also be introduced to the concept of "sleep hygiene," and discover ways to self-manage certain sleeping problems through simple daily behavior changes. Finally, the workshop will motivate patients by clarifying the links between quality sleep and their goals of heart-healthy living.   Recognizing and Reducing Stress Clinical staff led group instruction and group discussion with PowerPoint presentation and patient guidebook. To enhance the learning environment the use of posters, models and videos may be added. At the conclusion of this workshop, patients will be able to understand the types of stress reactions, differentiate between acute and chronic stress, and recognize the impact that chronic stress has on their health. They will also be able to apply different coping mechanisms, such as reframing negative self-talk. Patients will have the opportunity to practice a variety of stress management techniques, such as deep abdominal breathing, progressive muscle relaxation, and/or guided imagery.  The purpose of this lesson is to educate patients on the role of stress in their lives and to provide healthy techniques for coping with it.  Learning  Barriers/Preferences:  Learning Barriers/Preferences - 01/19/24 1112       Learning Barriers/Preferences   Learning Barriers None    Learning Preferences  Computer/Internet;Individual Instruction;Pictoral;Verbal Instruction;Video;Written Material          Education Topics:  Knowledge Questionnaire Score:  Knowledge Questionnaire Score - 01/19/24 1423       Knowledge Questionnaire Score   Pre Score 22/24          Core Components/Risk Factors/Patient Goals at Admission:  Personal Goals and Risk Factors at Admission - 01/19/24 1216       Core Components/Risk Factors/Patient Goals on Admission   Diabetes Yes    Intervention Provide education about signs/symptoms and action to take for hypo/hyperglycemia.;Provide education about proper nutrition, including hydration, and aerobic/resistive exercise prescription along with prescribed medications to achieve blood glucose in normal ranges: Fasting glucose 65-99 mg/dL    Expected Outcomes Short Term: Participant verbalizes understanding of the signs/symptoms and immediate care of hyper/hypoglycemia, proper foot care and importance of medication, aerobic/resistive exercise and nutrition plan for blood glucose control.;Long Term: Attainment of HbA1C < 7%.    Heart Failure Yes    Intervention Provide a combined exercise and nutrition program that is supplemented with education, support and counseling about heart failure. Directed toward relieving symptoms such as shortness of breath, decreased exercise tolerance, and extremity edema.    Expected Outcomes Short term: Attendance in program 2-3 days a week with increased exercise capacity. Reported lower sodium intake. Reported increased fruit and vegetable intake. Reports medication compliance.;Improve functional capacity of life;Short term: Daily weights obtained and reported for increase. Utilizing diuretic protocols set by physician.;Long term: Adoption of self-care skills and reduction of  barriers for early signs and symptoms recognition and intervention leading to self-care maintenance.    Hypertension Yes    Intervention Provide education on lifestyle modifcations including regular physical activity/exercise, weight management, moderate sodium restriction and increased consumption of fresh fruit, vegetables, and low fat dairy, alcohol moderation, and smoking cessation.;Monitor prescription use compliance.    Expected Outcomes Short Term: Continued assessment and intervention until BP is < 140/40mm HG in hypertensive participants. < 130/59mm HG in hypertensive participants with diabetes, heart failure or chronic kidney disease.;Long Term: Maintenance of blood pressure at goal levels.    Lipids Yes    Intervention Provide education and support for participant on nutrition & aerobic/resistive exercise along with prescribed medications to achieve LDL 70mg , HDL >40mg .    Expected Outcomes Short Term: Participant states understanding of desired cholesterol values and is compliant with medications prescribed. Participant is following exercise prescription and nutrition guidelines.;Long Term: Cholesterol controlled with medications as prescribed, with individualized exercise RX and with personalized nutrition plan. Value goals: LDL < 70mg , HDL > 40 mg.          Core Components/Risk Factors/Patient Goals Review:    Core Components/Risk Factors/Patient Goals at Discharge (Final Review):    ITP Comments:  ITP Comments     Row Name 01/19/24 1018           ITP Comments Medical Director- Dr. Wilbert Bihari, MD. Introduction to the [facility]. Initial folder reviewed with Prentice.          Comments: Jebidiah attended orientation for the cardiac rehabilitation program on  01/19/2024  to perform initial intake and exercise walk test. He was introduced to the [employer] education and orientation packet was reviewed. Completed 6-minute walk test,  measurements, initial ITP, and exercise prescription. Vital signs stable. Telemetry-normal sinus rhythm, with frequent PVCs/ bigeminy, RBBB. Wacey was asymptomatic throughout.   Service time was from 1018 to 1257. Arnoldo CHRISTELLA Gal, MS, ACSM CEP 01/19/2024  1426     

## 2024-01-19 NOTE — Progress Notes (Signed)
 Cardiac Rehab Medication Review   Does the patient  feel that his/her medications are working for him/her?  yes  Has the patient been experiencing any side effects to the medications prescribed?  Possible cough from medication  Does the patient measure his/her own blood pressure or blood glucose at home?  Yes, he checks his blood sugar and blood pressure each morning.  Does the patient have any problems obtaining medications due to transportation or finances?   no  Understanding of regimen: excellent Understanding of indications: excellent Potential of compliance: excellent    Comments: Jonathan Cervantes checks his blood pressure most morning.    Jonathan Cervantes 01/19/2024 12:12 PM

## 2024-01-20 ENCOUNTER — Encounter (HOSPITAL_COMMUNITY)

## 2024-01-22 ENCOUNTER — Encounter (HOSPITAL_COMMUNITY)

## 2024-01-25 ENCOUNTER — Encounter (HOSPITAL_COMMUNITY)

## 2024-01-25 ENCOUNTER — Encounter (HOSPITAL_COMMUNITY)
Admission: RE | Admit: 2024-01-25 | Discharge: 2024-01-25 | Disposition: A | Discharge status: Home / Self Care | Source: Ambulatory Visit | Attending: Cardiovascular Disease | Admitting: Cardiovascular Disease

## 2024-01-25 DIAGNOSIS — Z955 Presence of coronary angioplasty implant and graft: Secondary | ICD-10-CM | POA: Insufficient documentation

## 2024-01-25 DIAGNOSIS — I5022 Chronic systolic (congestive) heart failure: Secondary | ICD-10-CM | POA: Insufficient documentation

## 2024-01-25 NOTE — Progress Notes (Signed)
 Patient reports that he was standing at the pulpit things became dark while he was praying in the pulpit. 2 of his parishioners helped him sit down. Jonathan Cervantes says that he is diabetic and his blood sugar was 160 that morning he ate breakfast. This was at the end of the service. Jonathan Cervantes says he went home and checked his blood sugar it was 275. Jonathan Cervantes checked his blood pressure it was 120/70 heart rate 75. Today sitting blood pressure 122/64 sitting . Standing blood pressure 132/82 heart rate 76. Telemetry rhythm Sinus Rhythm rate 75.  CBG 268 at home this morning. Will consult with onsite provider [name] NP. Discussed with onsite provider [name]. [name] recommended that Jonathan Cervantes follow up with Dr Verlin on Wed. Will hold off on exercise until he sees Dr Verlin on  Wednesday. Patient states understanding and left cardiac rehab without further complaints.Hadassah Elpidio Quan RN BSN

## 2024-01-27 ENCOUNTER — Encounter (HOSPITAL_COMMUNITY)

## 2024-01-27 NOTE — Progress Notes (Signed)
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 of the coming together of mind, breath, and body. The benefits of yoga include improved flexibility, improved range of motion, better posture and core strength, increased lung function, weight loss, and positive self-image. Yoga's heart health benefits include lowered blood pressure, healthier heart rate, decreased cholesterol and triglyceride levels, improved immune function, and reduced stress. Seek guidance from your physician and exercise physiologist before implementing an exercise routine and learn your capabilities and proper form for all exercise.  Medical   Aging: Enhancing Your Quality of Life  Clinical staff conducted group or individual video education with verbal and written material and guidebook.  Patient learns key strategies and recommendations  to stay in good physical health and enhance quality of life, such as prevention strategies, having an advocate, securing a Health Care Proxy and Power of Attorney, and keeping a list of medications and system for tracking them. It also discusses how to avoid risk for bone loss.  Biology of Weight Control  Clinical staff conducted group or individual video education with verbal and written material and guidebook.  Patient learns that weight gain occurs because we consume more calories than we burn (eating more, moving less). Even if your body weight is normal, you may have higher ratios of fat compared to muscle mass. Too much body fat puts you at increased risk for cardiovascular disease, heart attack, stroke, type 2 diabetes, and obesity-related cancers. In addition to exercise, following the Pritikin Eating Plan can help reduce your risk.  Decoding Lab Results  Clinical staff conducted group or individual video education with verbal and written material and guidebook.  Patient learns that lab test reflects one measurement whose values change over time and are influenced by many factors, including medication, stress, sleep, exercise, food, hydration, pre-existing medical conditions, and more. It is recommended to use the knowledge from this video to become more involved with your lab results and evaluate your numbers to speak with your doctor.   Diseases of Our Time - Overview  Clinical staff conducted group or individual video education with verbal and written material and guidebook.  Patient learns that according to the CDC, 50% to 70% of chronic diseases (such as obesity, type 2 diabetes, elevated lipids, hypertension, and heart disease) are avoidable through lifestyle improvements including healthier food choices, listening to satiety cues, and increased physical activity.  Sleep Disorders Clinical staff conducted group or individual video education with verbal and written material and  guidebook.  Patient learns how good quality and duration of sleep are important to overall health and well-being. Patient also learns about sleep disorders and how they impact health along with recommendations to address them, including discussing  with a physician.  Nutrition  Dining Out - Part 2 Clinical staff conducted group or individual video education with verbal and written material and guidebook.  Patient learns how to plan ahead and communicate in order to maximize their dining experience in a healthy and nutritious manner. Included are recommended food choices based on the type of restaurant the patient is visiting.   Fueling a Banker conducted group or individual video education with verbal and written material and guidebook.  There is a strong connection between our food choices and our health. Diseases like obesity and type 2 diabetes are very prevalent and are in large-part due to lifestyle choices. The Pritikin Eating Plan provides plenty of food and hunger-curbing satisfaction. It is easy to follow, affordable, and helps reduce health risks.  Menu Workshop  Clinical staff conducted group or individual video education with verbal and written material and guidebook.  Patient learns that restaurant meals can sabotage health goals because they are often packed with calories, fat, sodium, and sugar. Recommendations include strategies to plan ahead and to communicate with the manager, chef, or server to help order a healthier meal.  Planning Your Eating Strategy  Clinical staff conducted group or individual video education with verbal and written material and guidebook.  Patient learns about the Pritikin Eating Plan and its benefit of reducing the risk of disease. The Pritikin Eating Plan does not focus on calories. Instead, it emphasizes high-quality, nutrient-rich foods. By knowing the characteristics of the foods, we choose, we can determine their calorie density  and make informed decisions.  Targeting Your Nutrition Priorities  Clinical staff conducted group or individual video education with verbal and written material and guidebook.  Patient learns that lifestyle habits have a tremendous impact on disease risk and progression. This video provides eating and physical activity recommendations based on your personal health goals, such as reducing LDL cholesterol, losing weight, preventing or controlling type 2 diabetes, and reducing high blood pressure.  Vitamins and Minerals  Clinical staff conducted group or individual video education with verbal and written material and guidebook.  Patient learns different ways to obtain key vitamins and minerals, including through a recommended healthy diet. It is important to discuss all supplements you take with your doctor.   Healthy Mind-Set    Smoking Cessation  Clinical staff conducted group or individual video education with verbal and written material and guidebook.  Patient learns that cigarette smoking and tobacco addiction pose a serious health risk which affects millions of people. Stopping smoking will significantly reduce the risk of heart disease, lung disease, and many forms of cancer. Recommended strategies for quitting are covered, including working with your doctor to develop a successful plan.  Culinary   Becoming a Set designer conducted group or individual video education with verbal and written material and guidebook.  Patient learns that cooking at home can be healthy, cost-effective, quick, and puts them in control. Keys to cooking healthy recipes will include looking at your recipe, assessing your equipment needs, planning ahead, making it simple, choosing cost-effective seasonal ingredients, and limiting the use of added fats, salts, and sugars.  Cooking - Breakfast and Snacks  Clinical staff conducted group or individual video education with verbal and written material  and guidebook.  Patient learns how important breakfast is to satiety and nutrition through the entire day. Recommendations include key foods to eat during breakfast to help stabilize blood sugar levels and to prevent overeating at meals later  in the day. Planning ahead is also a key component.  Cooking - Educational psychologist conducted group or individual video education with verbal and written material and guidebook.  Patient learns eating strategies to improve overall health, including an approach to cook more at home. Recommendations include thinking of animal protein as a side on your plate rather than center stage and focusing instead on lower calorie dense options like vegetables, fruits, whole grains, and plant-based proteins, such as beans. Making sauces in large quantities to freeze for later and leaving the skin on your vegetables are also recommended to maximize your experience.  Cooking - Healthy Salads and Dressing Clinical staff conducted group or individual video education with verbal and written material and guidebook.  Patient learns that vegetables, fruits, whole grains, and legumes are the foundations of the Pritikin Eating Plan. Recommendations include how to incorporate each of these in flavorful and healthy salads, and how to create homemade salad dressings. Proper handling of ingredients is also covered. Cooking - Soups and [employer] - Soups and Desserts Clinical staff conducted group or individual video education with verbal and written material and guidebook.  Patient learns that Pritikin soups and desserts make for easy, nutritious, and delicious snacks and meal components that are low in sodium, fat, sugar, and calorie density, while high in vitamins, minerals, and filling fiber. Recommendations include simple and healthy ideas for soups and desserts.   Overview     The Pritikin Solution Program Overview Clinical staff conducted group or individual video  education with verbal and written material and guidebook.  Patient learns that the results of the Pritikin Program have been documented in more than 100 articles published in peer-reviewed journals, and the benefits include reducing risk factors for (and, in some cases, even reversing) high cholesterol, high blood pressure, type 2 diabetes, obesity, and more! An overview of the three key pillars of the Pritikin Program will be covered: eating well, doing regular exercise, and having a healthy mind-set.  WORKSHOPS  Exercise: Exercise Basics: Building Your Action Plan Clinical staff led group instruction and group discussion with PowerPoint presentation and patient guidebook. To enhance the learning environment the use of posters, models and videos may be added. At the conclusion of this workshop, patients will comprehend the difference between physical activity and exercise, as well as the benefits of incorporating both, into their routine. Patients will understand the FITT (Frequency, Intensity, Time, and Type) principle and how to use it to build an exercise action plan. In addition, safety concerns and other considerations for exercise and cardiac rehab will be addressed by the presenter. The purpose of this lesson is to promote a comprehensive and effective weekly exercise routine in order to improve patients' overall level of fitness.   Managing Heart Disease: Your Path to a Healthier Heart Clinical staff led group instruction and group discussion with PowerPoint presentation and patient guidebook. To enhance the learning environment the use of posters, models and videos may be added.At the conclusion of this workshop, patients will understand the anatomy and physiology of the heart. Additionally, they will understand how Pritikin's three pillars impact the risk factors, the progression, and the management of heart disease.  The purpose of this lesson is to provide a high-level overview of the  heart, heart disease, and how the Pritikin lifestyle positively impacts risk factors.  Exercise Biomechanics Clinical staff led group instruction and group discussion with PowerPoint presentation and patient guidebook. To enhance the learning environment the  use of posters, models and videos may be added. Patients will learn how the structural parts of their bodies function and how these functions impact their daily activities, movement, and exercise. Patients will learn how to promote a neutral spine, learn how to manage pain, and identify ways to improve their physical movement in order to promote healthy living. The purpose of this lesson is to expose patients to common physical limitations that impact physical activity. Participants will learn practical ways to adapt and manage aches and pains, and to minimize their effect on regular exercise. Patients will learn how to maintain good posture while sitting, walking, and lifting.  Balance Training and Fall Prevention  Clinical staff led group instruction and group discussion with PowerPoint presentation and patient guidebook. To enhance the learning environment the use of posters, models and videos may be added. At the conclusion of this workshop, patients will understand the importance of their sensorimotor skills (vision, proprioception, and the vestibular system) in maintaining their ability to balance as they age. Patients will apply a variety of balancing exercises that are appropriate for their current level of function. Patients will understand the common causes for poor balance, possible solutions to these problems, and ways to modify their physical environment in order to minimize their fall risk. The purpose of this lesson is to teach patients about the importance of maintaining balance as they age and ways to minimize their risk of falling.  WORKSHOPS   Nutrition:  Fueling a Ship broker led group instruction and  group discussion with PowerPoint presentation and patient guidebook. To enhance the learning environment the use of posters, models and videos may be added. Patients will review the foundational principles of the Pritikin Eating Plan and understand what constitutes a serving size in each of the food groups. Patients will also learn Pritikin-friendly foods that are better choices when away from home and review make-ahead meal and snack options. Calorie density will be reviewed and applied to three nutrition priorities: weight maintenance, weight loss, and weight gain. The purpose of this lesson is to reinforce (in a group setting) the key concepts around what patients are recommended to eat and how to apply these guidelines when away from home by planning and selecting Pritikin-friendly options. Patients will understand how calorie density may be adjusted for different weight management goals.  Mindful Eating  Clinical staff led group instruction and group discussion with PowerPoint presentation and patient guidebook. To enhance the learning environment the use of posters, models and videos may be added. Patients will briefly review the concepts of the Pritikin Eating Plan and the importance of low-calorie dense foods. The concept of mindful eating will be introduced as well as the importance of paying attention to internal hunger signals. Triggers for non-hunger eating and techniques for dealing with triggers will be explored. The purpose of this lesson is to provide patients with the opportunity to review the basic principles of the Pritikin Eating Plan, discuss the value of eating mindfully and how to measure internal cues of hunger and fullness using the Hunger Scale. Patients will also discuss reasons for non-hunger eating and learn strategies to use for controlling emotional eating.  Targeting Your Nutrition Priorities Clinical staff led group instruction and group discussion with PowerPoint presentation  and patient guidebook. To enhance the learning environment the use of posters, models and videos may be added. Patients will learn how to determine their genetic susceptibility to disease by reviewing their family history. Patients will gain insight into  the importance of diet as part of an overall healthy lifestyle in mitigating the impact of genetics and other environmental insults. The purpose of this lesson is to provide patients with the opportunity to assess their personal nutrition priorities by looking at their family history, their own health history and current risk factors. Patients will also be able to discuss ways of prioritizing and modifying the Pritikin Eating Plan for their highest risk areas  Menu  Clinical staff led group instruction and group discussion with PowerPoint presentation and patient guidebook. To enhance the learning environment the use of posters, models and videos may be added. Using menus brought in from [employer], or printed from [employer], patients will apply the Pritikin dining out guidelines that were presented in the [employer] video. Patients will also be able to practice these guidelines in a variety of provided scenarios. The purpose of this lesson is to provide patients with the opportunity to practice hands-on learning of the Pritikin Dining Out guidelines with actual menus and practice scenarios.  Label Reading Clinical staff led group instruction and group discussion with PowerPoint presentation and patient guidebook. To enhance the learning environment the use of posters, models and videos may be added. Patients will review and discuss the Pritikin label reading guidelines presented in Pritikin's Label Reading Educational series video. Using fool labels brought in from local grocery stores and markets, patients will apply the label reading guidelines and determine if the packaged food meet the Pritikin guidelines. The purpose of  this lesson is to provide patients with the opportunity to review, discuss, and practice hands-on learning of the Pritikin Label Reading guidelines with actual packaged food labels. Cooking School  Pritikin's [employer] are designed to teach patients ways to prepare quick, simple, and affordable recipes at home. The importance of nutrition's role in chronic disease risk reduction is reflected in its emphasis in the overall Pritikin program. By learning how to prepare essential core Pritikin Eating Plan recipes, patients will increase control over what they eat; be able to customize the flavor of foods without the use of added salt, sugar, or fat; and improve the quality of the food they consume. By learning a set of core recipes which are easily assembled, quickly prepared, and affordable, patients are more likely to prepare more healthy foods at home. These workshops focus on convenient breakfasts, simple entres, side dishes, and desserts which can be prepared with minimal effort and are consistent with nutrition recommendations for cardiovascular risk reduction. Cooking [employer] are taught by a Armed forces logistics/support/administrative officer (RD) who has been trained by the [employer]. The chef or RD has a clear understanding of the importance of minimizing - if not completely eliminating - added fat, sugar, and sodium in recipes. Throughout the series of Cooking School Workshop sessions, patients will learn about healthy ingredients and efficient methods of cooking to build confidence in their capability to prepare    Cooking School weekly topics:  Adding Flavor- Sodium-Free  Fast and Healthy Breakfasts  Powerhouse Plant-Based Proteins  Satisfying Salads and Dressings  Simple Sides and Sauces  International Cuisine-Spotlight on the [employer] Zones  Delicious Desserts  Savory Soups  [employer] - Meals in a Astronomer Appetizers and Snacks  Comforting Weekend  Breakfasts  One-Pot Wonders   Fast Evening Meals  Landscape architect Your Pritikin Plate  WORKSHOPS   Healthy Mindset (Psychosocial):  Focused Goals, Sustainable Changes Clinical staff led group instruction and  group discussion with PowerPoint presentation and patient guidebook. To enhance the learning environment the use of posters, models and videos may be added. Patients will be able to apply effective goal setting strategies to establish at least one personal goal, and then take consistent, meaningful action toward that goal. They will learn to identify common barriers to achieving personal goals and develop strategies to overcome them. Patients will also gain an understanding of how our mind-set can impact our ability to achieve goals and the importance of cultivating a positive and growth-oriented mind-set. The purpose of this lesson is to provide patients with a deeper understanding of how to set and achieve personal goals, as well as the tools and strategies needed to overcome common obstacles which may arise along the way.  From Head to Heart: The Power of a Healthy Outlook  Clinical staff led group instruction and group discussion with PowerPoint presentation and patient guidebook. To enhance the learning environment the use of posters, models and videos may be added. Patients will be able to recognize and describe the impact of emotions and mood on physical health. They will discover the importance of self-care and explore self-care practices which may work for them. Patients will also learn how to utilize the 4 C's to cultivate a healthier outlook and better manage stress and challenges. The purpose of this lesson is to demonstrate to patients how a healthy outlook is an essential part of maintaining good health, especially as they continue their cardiac rehab journey.  Healthy Sleep for a Healthy Heart Clinical staff led group instruction and group discussion with  PowerPoint presentation and patient guidebook. To enhance the learning environment the use of posters, models and videos may be added. At the conclusion of this workshop, patients will be able to demonstrate knowledge of the importance of sleep to overall health, well-being, and quality of life. They will understand the symptoms of, and treatments for, common sleep disorders. Patients will also be able to identify daytime and nighttime behaviors which impact sleep, and they will be able to apply these tools to help manage sleep-related challenges. The purpose of this lesson is to provide patients with a general overview of sleep and outline the importance of quality sleep. Patients will learn about a few of the most common sleep disorders. Patients will also be introduced to the concept of "sleep hygiene," and discover ways to self-manage certain sleeping problems through simple daily behavior changes. Finally, the workshop will motivate patients by clarifying the links between quality sleep and their goals of heart-healthy living.   Recognizing and Reducing Stress Clinical staff led group instruction and group discussion with PowerPoint presentation and patient guidebook. To enhance the learning environment the use of posters, models and videos may be added. At the conclusion of this workshop, patients will be able to understand the types of stress reactions, differentiate between acute and chronic stress, and recognize the impact that chronic stress has on their health. They will also be able to apply different coping mechanisms, such as reframing negative self-talk. Patients will have the opportunity to practice a variety of stress management techniques, such as deep abdominal breathing, progressive muscle relaxation, and/or guided imagery.  The purpose of this lesson is to educate patients on the role of stress in their lives and to provide healthy techniques for coping with it.  Learning  Barriers/Preferences:  Learning Barriers/Preferences - 01/19/24 1112       Learning Barriers/Preferences   Learning Barriers None    Learning Preferences  Computer/Internet;Individual Instruction;Pictoral;Verbal Instruction;Video;Written Material          Education Topics:  Knowledge Questionnaire Score:  Knowledge Questionnaire Score - 01/19/24 1423       Knowledge Questionnaire Score   Pre Score 22/24          Core Components/Risk Factors/Patient Goals at Admission:  Personal Goals and Risk Factors at Admission - 01/19/24 1216       Core Components/Risk Factors/Patient Goals on Admission   Diabetes Yes    Intervention Provide education about signs/symptoms and action to take for hypo/hyperglycemia.;Provide education about proper nutrition, including hydration, and aerobic/resistive exercise prescription along with prescribed medications to achieve blood glucose in normal ranges: Fasting glucose 65-99 mg/dL    Expected Outcomes Short Term: Participant verbalizes understanding of the signs/symptoms and immediate care of hyper/hypoglycemia, proper foot care and importance of medication, aerobic/resistive exercise and nutrition plan for blood glucose control.;Long Term: Attainment of HbA1C < 7%.    Heart Failure Yes    Intervention Provide a combined exercise and nutrition program that is supplemented with education, support and counseling about heart failure. Directed toward relieving symptoms such as shortness of breath, decreased exercise tolerance, and extremity edema.    Expected Outcomes Short term: Attendance in program 2-3 days a week with increased exercise capacity. Reported lower sodium intake. Reported increased fruit and vegetable intake. Reports medication compliance.;Improve functional capacity of life;Short term: Daily weights obtained and reported for increase. Utilizing diuretic protocols set by physician.;Long term: Adoption of self-care skills and reduction of  barriers for early signs and symptoms recognition and intervention leading to self-care maintenance.    Hypertension Yes    Intervention Provide education on lifestyle modifcations including regular physical activity/exercise, weight management, moderate sodium restriction and increased consumption of fresh fruit, vegetables, and low fat dairy, alcohol moderation, and smoking cessation.;Monitor prescription use compliance.    Expected Outcomes Short Term: Continued assessment and intervention until BP is < 140/39mm HG in hypertensive participants. < 130/59mm HG in hypertensive participants with diabetes, heart failure or chronic kidney disease.;Long Term: Maintenance of blood pressure at goal levels.    Lipids Yes    Intervention Provide education and support for participant on nutrition & aerobic/resistive exercise along with prescribed medications to achieve LDL 70mg , HDL >40mg .    Expected Outcomes Short Term: Participant states understanding of desired cholesterol values and is compliant with medications prescribed. Participant is following exercise prescription and nutrition guidelines.;Long Term: Cholesterol controlled with medications as prescribed, with individualized exercise RX and with personalized nutrition plan. Value goals: LDL < 70mg , HDL > 40 mg.          Core Components/Risk Factors/Patient Goals Review:    Core Components/Risk Factors/Patient Goals at Discharge (Final Review):    ITP Comments:  ITP Comments     Row Name 01/19/24 1018 01/25/24 1703         ITP Comments Medical Director- Dr. Wilbert Bihari, MD. Introduction to the [facility]/ Intensive [facility]iac Rehab Program. Initial folder reviewed with Prentice. 30 Day ITP Review. Erion is tenatively scheduled to start cardiac rehab on 01/29/24.         Comments: See ITP comments.Hadassah Elpidio Quan RN BSN

## 2024-01-29 ENCOUNTER — Encounter (HOSPITAL_COMMUNITY)

## 2024-01-29 ENCOUNTER — Encounter (HOSPITAL_COMMUNITY): Admission: RE | Admit: 2024-01-29 | Source: Ambulatory Visit

## 2024-02-01 ENCOUNTER — Encounter (HOSPITAL_COMMUNITY)

## 2024-02-03 ENCOUNTER — Encounter (HOSPITAL_COMMUNITY)

## 2024-02-03 ENCOUNTER — Encounter: Payer: Self-pay | Admitting: Cardiovascular Disease

## 2024-02-03 ENCOUNTER — Ambulatory Visit: Attending: Cardiovascular Disease | Admitting: Cardiovascular Disease

## 2024-02-03 VITALS — BP 112/68 | HR 84 | Ht 70.0 in | Wt 165.4 lb

## 2024-02-03 DIAGNOSIS — I340 Nonrheumatic mitral (valve) insufficiency: Secondary | ICD-10-CM

## 2024-02-03 DIAGNOSIS — I472 Ventricular tachycardia, unspecified: Secondary | ICD-10-CM | POA: Diagnosis not present

## 2024-02-03 DIAGNOSIS — I255 Ischemic cardiomyopathy: Secondary | ICD-10-CM | POA: Diagnosis not present

## 2024-02-03 DIAGNOSIS — I5022 Chronic systolic (congestive) heart failure: Secondary | ICD-10-CM

## 2024-02-03 DIAGNOSIS — I2510 Atherosclerotic heart disease of native coronary artery without angina pectoris: Secondary | ICD-10-CM | POA: Diagnosis not present

## 2024-02-03 NOTE — Patient Instructions (Signed)
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
## 2024-02-05 ENCOUNTER — Encounter (HOSPITAL_COMMUNITY)

## 2024-02-05 ENCOUNTER — Encounter (HOSPITAL_COMMUNITY)
Admission: RE | Admit: 2024-02-05 | Discharge: 2024-02-05 | Disposition: A | Source: Ambulatory Visit | Attending: Cardiovascular Disease

## 2024-02-05 DIAGNOSIS — Z955 Presence of coronary angioplasty implant and graft: Secondary | ICD-10-CM

## 2024-02-05 DIAGNOSIS — I5022 Chronic systolic (congestive) heart failure: Secondary | ICD-10-CM

## 2024-02-05 LAB — GLUCOSE, CAPILLARY
Glucose-Capillary: 174 mg/dL — ABNORMAL HIGH (ref 70–99)
Glucose-Capillary: 128 mg/dL — ABNORMAL HIGH (ref 70–99)

## 2024-02-05 NOTE — Progress Notes (Signed)
 Daily Session Note  Patient Details  Name: Jonathan Cervantes. MRN: [id number] Date of Birth: 1951/04/02 Referring Provider:   Flowsheet Row INTENSIVE CARDIAC REHAB ORIENT from 01/19/2024 in [facility]  Referring Provider Verlin Lonni BIRCH, MD    Encounter Date: 02/05/2024  Check In:  Session Check In - 02/05/24 1502       Check-In   Supervising physician immediately available to respond to emergencies [facility] MD immediately available    Physician(s) Rosabel Mose, NP    Location [facility]    Staff Present Arnoldo Gal, MS, ACSM-CEP, Exercise Physiologist;David Makemson, MS, ACSM-CEP, CCRP, Exercise Physiologist;Ladawn Boullion, RN, Valere Music, RN, BSN;Jetta Walker BS, ACSM-CEP, Exercise Physiologist;Johnny Fayette, MS, Exercise Physiologist    Virtual Visit No    Medication changes reported     No    Fall or balance concerns reported    No    Tobacco Cessation No Change    Warm-up and Cool-down Performed as group-led instruction   See note in EPIC   Resistance Training Performed Yes    VAD Patient? No    PAD/SET Patient? No      Pain Assessment   Currently in Pain? No/denies    Pain Score 0-No pain    Multiple Pain Sites No          Capillary Blood Glucose: Results for orders placed or performed during the hospital encounter of 02/05/24 (from the past 24 hours)  Glucose, capillary     Status: Abnormal   Collection Time: 02/05/24  3:10 PM  Result Value Ref Range   Glucose-Capillary 174 (H) 70 - 99 mg/dL  Glucose, capillary     Status: Abnormal   Collection Time: 02/05/24  3:54 PM  Result Value Ref Range   Glucose-Capillary 128 (H) 70 - 99 mg/dL      Social History   Tobacco Use  Smoking Status Never  Smokeless Tobacco Never    Goals Met:  Exercise tolerated well No report of concerns or symptoms today Strength training completed today  Goals Unmet:  Not  Applicable  Comments: Pt started cardiac rehab today.  Pt tolerated light exercise without difficulty. VSS, telemetry-Sinus Rhythm, asymptomatic.  Medication list reconciled. Pt denies barriers to medicaiton compliance.  PSYCHOSOCIAL ASSESSMENT:  PHQ-3. Pt exhibits positive coping skills, hopeful outlook with supportive family. No psychosocial needs identified at this time, no psychosocial interventions necessary.    Pt enjoys computer.   Pt oriented to exercise equipment and routine.    Understanding verbalized.Hadassah Elpidio Quan RN BSN    Dr. Wilbert Bihari is Medical Director for Cardiac Rehab at [facility].

## 2024-02-06 ENCOUNTER — Encounter: Payer: Self-pay | Admitting: Cardiovascular Disease

## 2024-02-08 ENCOUNTER — Encounter (HOSPITAL_COMMUNITY): Admission: RE | Admit: 2024-02-08 | Discharge: 2024-02-08 | Attending: Cardiovascular Disease

## 2024-02-08 ENCOUNTER — Encounter (HOSPITAL_COMMUNITY)

## 2024-02-08 DIAGNOSIS — Z955 Presence of coronary angioplasty implant and graft: Secondary | ICD-10-CM

## 2024-02-08 DIAGNOSIS — I5022 Chronic systolic (congestive) heart failure: Secondary | ICD-10-CM | POA: Diagnosis not present

## 2024-02-08 LAB — GLUCOSE, CAPILLARY
Glucose-Capillary: 154 mg/dL — ABNORMAL HIGH (ref 70–99)
Glucose-Capillary: 144 mg/dL — ABNORMAL HIGH (ref 70–99)

## 2024-02-08 MED ORDER — SPIRONOLACTONE 25 MG PO TABS: 12.5000 mg | ORAL_TABLET | Freq: Every day | ORAL | 3 refills | Status: AC

## 2024-02-10 ENCOUNTER — Encounter (HOSPITAL_COMMUNITY)

## 2024-02-10 ENCOUNTER — Encounter (HOSPITAL_COMMUNITY)
Admission: RE | Admit: 2024-02-10 | Discharge: 2024-02-10 | Disposition: A | Discharge status: Home / Self Care | Source: Ambulatory Visit | Attending: Cardiovascular Disease | Admitting: Cardiovascular Disease

## 2024-02-10 DIAGNOSIS — Z955 Presence of coronary angioplasty implant and graft: Secondary | ICD-10-CM

## 2024-02-10 DIAGNOSIS — I5022 Chronic systolic (congestive) heart failure: Secondary | ICD-10-CM

## 2024-02-12 ENCOUNTER — Encounter (HOSPITAL_COMMUNITY)

## 2024-02-12 ENCOUNTER — Encounter (HOSPITAL_COMMUNITY)
Admission: RE | Admit: 2024-02-12 | Discharge: 2024-02-12 | Disposition: A | Discharge status: Home / Self Care | Source: Ambulatory Visit | Attending: Cardiovascular Disease | Admitting: Cardiovascular Disease

## 2024-02-12 DIAGNOSIS — Z955 Presence of coronary angioplasty implant and graft: Secondary | ICD-10-CM

## 2024-02-12 DIAGNOSIS — I5022 Chronic systolic (congestive) heart failure: Secondary | ICD-10-CM | POA: Diagnosis not present

## 2024-02-15 ENCOUNTER — Encounter (HOSPITAL_COMMUNITY)
Admission: RE | Admit: 2024-02-15 | Discharge: 2024-02-15 | Disposition: A | Discharge status: Home / Self Care | Source: Ambulatory Visit | Attending: Cardiovascular Disease | Admitting: Cardiovascular Disease

## 2024-02-15 ENCOUNTER — Encounter (HOSPITAL_COMMUNITY)

## 2024-02-15 DIAGNOSIS — Z955 Presence of coronary angioplasty implant and graft: Secondary | ICD-10-CM

## 2024-02-15 DIAGNOSIS — I5022 Chronic systolic (congestive) heart failure: Secondary | ICD-10-CM

## 2024-02-17 ENCOUNTER — Encounter (HOSPITAL_COMMUNITY)

## 2024-02-17 ENCOUNTER — Encounter (HOSPITAL_COMMUNITY)
Admission: RE | Admit: 2024-02-17 | Discharge: 2024-02-17 | Disposition: A | Source: Ambulatory Visit | Attending: Cardiovascular Disease

## 2024-02-17 DIAGNOSIS — I5022 Chronic systolic (congestive) heart failure: Secondary | ICD-10-CM | POA: Diagnosis not present

## 2024-02-17 DIAGNOSIS — Z955 Presence of coronary angioplasty implant and graft: Secondary | ICD-10-CM

## 2024-02-17 LAB — GLUCOSE, CAPILLARY: Glucose-Capillary: 216 mg/dL — ABNORMAL HIGH (ref 70–99)

## 2024-02-17 NOTE — Progress Notes (Signed)
 Dorn reported feeling lightheaded during cool down stretches. Blood pressure 84/50. Telemetry rhythm Sinus rate 86. Patient was given water. Exercise stopped. Feet elevated. Recheck BP 94/60. CBG 216. Patient was given more water. Recheck BP 125/66 sitting. Standing 108/69. Medications reviewed taking as prescribed. Weight today 76.4 kg. Will consult with onsite provider Lum Louis NP Per Lum Louis NP.Recommend he stay hydrated. Current GDMT can cause hypotension. If he continues to experience symptoms of lightheadedness and hypotension while staying hydrated may need to to consider lowering GDMT. Otherwise no changes at this time.  MF Overall reassuring there is been improvement in blood pressure just with a little bit of hydration   Symptoms resolved upon exit from cardiac rehab. Will forward to [facility] NP. Will continue to monitor the patient throughout  the program. Mika plans to return to exercise on Friday. Hadassah Elpidio Quan RN BSN

## 2024-02-19 ENCOUNTER — Encounter (HOSPITAL_COMMUNITY)

## 2024-02-19 ENCOUNTER — Encounter (HOSPITAL_COMMUNITY)
Admission: RE | Admit: 2024-02-19 | Discharge: 2024-02-19 | Disposition: A | Discharge status: Home / Self Care | Source: Ambulatory Visit | Attending: Cardiovascular Disease | Admitting: Cardiovascular Disease

## 2024-02-19 DIAGNOSIS — Z955 Presence of coronary angioplasty implant and graft: Secondary | ICD-10-CM

## 2024-02-19 DIAGNOSIS — I5022 Chronic systolic (congestive) heart failure: Secondary | ICD-10-CM

## 2024-02-20 ENCOUNTER — Encounter: Payer: Self-pay | Admitting: Cardiovascular Disease

## 2024-02-22 ENCOUNTER — Encounter (HOSPITAL_COMMUNITY)

## 2024-02-22 ENCOUNTER — Telehealth (HOSPITAL_COMMUNITY): Payer: Self-pay | Admitting: Vascular Surgery

## 2024-02-22 ENCOUNTER — Telehealth (HOSPITAL_COMMUNITY): Payer: Self-pay | Admitting: *Deleted

## 2024-02-22 MED ORDER — CLOPIDOGREL BISULFATE 75 MG PO TABS: 75.0000 mg | ORAL_TABLET | Freq: Every day | ORAL | 2 refills | Status: AC

## 2024-02-22 NOTE — Telephone Encounter (Signed)
-----   Message from Harlene CHRISTELLA Gainer sent at 02/22/2024 11:35 AM EST ----- No med changes for now ----- Message ----- From: Cyrus Hadassah ORN, RN Sent: 02/17/2024   4:44 PM EST To: Harlene CHRISTELLA Gainer, FNP  Good afternoon Harlene, I want to bring it to your attention that Mr Mcglothen was lightheaded and hypotensive during cool down stretches at cardiac rehab. Please review attached media with vital signs from cardiac rehab. I discussed with out onsite provider Lum Ran NP. Could you please review his medications and let us  know if you have any further concerns or recommendations.  Tyion saw Dr Verlin recently per your note Mr Carnell is supposed to see Dr Zenaida next.  I appreciate your input! Sincerely, [facility]  Cardiac Rehab

## 2024-02-22 NOTE — Telephone Encounter (Signed)
 Lvm to make f.u appt w/ Zenaida next ava

## 2024-02-23 NOTE — Progress Notes (Signed)
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 organ as it circulates oxygen, nutrients, white blood cells, and hormones throughout the entire body, and carries waste away. Data supports a plant-based eating plan like the Pritikin Program for its effectiveness in slowing progression of and reversing heart disease. The video provides a number of  recommendations to address heart disease.   Metabolic Syndrome and Belly Fat  Clinical staff conducted group or individual video education with verbal and written material and guidebook.  Patient learns what metabolic syndrome is, how it leads to heart disease, and how one can reverse it and keep it from coming back. You have metabolic syndrome if you have 3 of the following 5 criteria: abdominal obesity, high blood pressure, high triglycerides, low HDL cholesterol, and high blood sugar.  Hypertension and Heart Disease Clinical staff conducted group or individual video education with verbal and written material and guidebook.  Patient learns that high blood pressure, or hypertension, is very common in the [place] . Hypertension is largely due to excessive salt intake, but other important risk factors include being overweight, physical inactivity, drinking too much alcohol, smoking, and not eating enough potassium from fruits and vegetables. High blood pressure is a leading risk factor for heart attack, stroke, congestive heart failure, dementia, kidney failure, and premature death. Long-term effects of excessive salt intake include stiffening of the arteries and thickening of heart muscle and organ damage. Recommendations include ways to reduce hypertension and the risk of heart disease.  Diseases of Our Time - Focusing on Diabetes Clinical staff conducted group or individual video education with verbal and written material and guidebook.  Patient learns why the best way to stop diseases of our time is prevention, through food and other lifestyle changes. Medicine (such as prescription pills and surgeries) is often only a Band-Aid on the problem, not a long-term solution. Most common diseases of our time include obesity, type 2 diabetes, hypertension, heart disease, and cancer. The Pritikin Program is recommended and has been proven to help reduce, reverse, and/or prevent the damaging effects of  metabolic syndrome.  Nutrition   Overview of the Pritikin Eating Plan  Clinical staff conducted group or individual video education with verbal and written material and guidebook.  Patient learns about the Pritikin Eating Plan for disease risk reduction. The Pritikin Eating Plan emphasizes a wide variety of unrefined, minimally-processed carbohydrates, like fruits, vegetables, whole grains, and legumes. Go, Caution, and Stop food choices are explained. Plant-based and lean animal proteins are emphasized. Rationale provided for low sodium intake for blood pressure control, low added sugars for blood sugar stabilization, and low added fats and oils for coronary artery disease risk reduction and weight management.  Calorie Density  Clinical staff conducted group or individual video education with verbal and written material and guidebook.  Patient learns about calorie density and how it impacts the Pritikin Eating Plan. Knowing the characteristics of the food you choose will help you decide whether those  foods will lead to weight gain or weight loss, and whether you want to consume more or less of them. Weight loss is usually a side effect of the Pritikin Eating Plan because of its focus on low calorie-dense foods.  Label Reading  Clinical staff conducted group or individual video education with verbal and written material and guidebook.  Patient learns about the Pritikin recommended label reading guidelines and corresponding recommendations regarding calorie density, added sugars, sodium content, and whole grains.  Dining Out - Part 1  Clinical staff conducted group or individual video education with verbal and written material and guidebook.  Patient learns that restaurant meals can be sabotaging because they can be so high in calories, fat, sodium, and/or sugar. Patient learns recommended strategies on how to positively address this and avoid unhealthy pitfalls.  Facts on Fats  Clinical staff  conducted group or individual video education with verbal and written material and guidebook.  Patient learns that lifestyle modifications can be just as effective, if not more so, as many medications for lowering your risk of heart disease. A Pritikin lifestyle can help to reduce your risk of inflammation and atherosclerosis (cholesterol build-up, or plaque, in the artery walls). Lifestyle interventions such as dietary choices and physical activity address the cause of atherosclerosis. A review of the types of fats and their impact on blood cholesterol levels, along with dietary recommendations to reduce fat intake is also included.  Nutrition Action Plan  Clinical staff conducted group or individual video education with verbal and written material and guidebook.  Patient learns how to incorporate Pritikin recommendations into their lifestyle. Recommendations include planning and keeping personal health goals in mind as an important part of their success.  Healthy Mind-Set    Healthy Minds, Bodies, Hearts  Clinical staff conducted group or individual video education with verbal and written material and guidebook.  Patient learns how to identify when they are stressed. Video will discuss the impact of that stress, as well as the many benefits of stress management. Patient will also be introduced to stress management techniques. The way we think, act, and feel has an impact on our hearts.  How Our Thoughts Can Heal Our Hearts  Clinical staff conducted group or individual video education with verbal and written material and guidebook.  Patient learns that negative thoughts can cause depression and anxiety. This can result in negative lifestyle behavior and serious health problems. Cognitive behavioral therapy is an effective method to help control our thoughts in order to change and improve our emotional outlook.  Additional Videos:  Exercise    Improving Performance  Clinical staff conducted group  or individual video education with verbal and written material and guidebook.  Patient learns to use a non-linear approach by alternating intensity levels and lengths of time spent exercising to help burn more calories and lose more body fat. Cardiovascular exercise helps improve heart health, metabolism, hormonal balance, blood sugar control, and recovery from fatigue. Resistance training improves strength, endurance, balance, coordination, reaction time, metabolism, and muscle mass. Flexibility exercise improves circulation, posture, and balance. Seek guidance from your physician and exercise physiologist before implementing an exercise routine and learn your capabilities and proper form for all exercise.  Introduction to Yoga  Clinical staff conducted group or individual video education with verbal and written material and guidebook.  Patient learns about yoga, a discipline of the coming together of mind, breath, and body. The benefits of yoga include improved flexibility, improved range of motion, better posture and core  strength, increased lung function, weight loss, and positive self-image. Yoga's heart health benefits include lowered blood pressure, healthier heart rate, decreased cholesterol and triglyceride levels, improved immune function, and reduced stress. Seek guidance from your physician and exercise physiologist before implementing an exercise routine and learn your capabilities and proper form for all exercise.  Medical   Aging: Enhancing Your Quality of Life  Clinical staff conducted group or individual video education with verbal and written material and guidebook.  Patient learns key strategies and recommendations to stay in good physical health and enhance quality of life, such as prevention strategies, having an advocate, securing a Health Care Proxy and Power of Attorney, and keeping a list of medications and system for tracking them. It also discusses how to avoid risk for bone  loss.  Biology of Weight Control  Clinical staff conducted group or individual video education with verbal and written material and guidebook.  Patient learns that weight gain occurs because we consume more calories than we burn (eating more, moving less). Even if your body weight is normal, you may have higher ratios of fat compared to muscle mass. Too much body fat puts you at increased risk for cardiovascular disease, heart attack, stroke, type 2 diabetes, and obesity-related cancers. In addition to exercise, following the Pritikin Eating Plan can help reduce your risk.  Decoding Lab Results  Clinical staff conducted group or individual video education with verbal and written material and guidebook.  Patient learns that lab test reflects one measurement whose values change over time and are influenced by many factors, including medication, stress, sleep, exercise, food, hydration, pre-existing medical conditions, and more. It is recommended to use the knowledge from this video to become more involved with your lab results and evaluate your numbers to speak with your doctor.   Diseases of Our Time - Overview  Clinical staff conducted group or individual video education with verbal and written material and guidebook.  Patient learns that according to the CDC, 50% to 70% of chronic diseases (such as obesity, type 2 diabetes, elevated lipids, hypertension, and heart disease) are avoidable through lifestyle improvements including healthier food choices, listening to satiety cues, and increased physical activity.  Sleep Disorders Clinical staff conducted group or individual video education with verbal and written material and guidebook.  Patient learns how good quality and duration of sleep are important to overall health and well-being. Patient also learns about sleep disorders and how they impact health along with recommendations to address them, including discussing with a physician.  Nutrition   Dining Out - Part 2 Clinical staff conducted group or individual video education with verbal and written material and guidebook.  Patient learns how to plan ahead and communicate in order to maximize their dining experience in a healthy and nutritious manner. Included are recommended food choices based on the type of restaurant the patient is visiting.   Fueling a Banker conducted group or individual video education with verbal and written material and guidebook.  There is a strong connection between our food choices and our health. Diseases like obesity and type 2 diabetes are very prevalent and are in large-part due to lifestyle choices. The Pritikin Eating Plan provides plenty of food and hunger-curbing satisfaction. It is easy to follow, affordable, and helps reduce health risks.  Menu Workshop  Clinical staff conducted group or individual video education with verbal and written material and guidebook.  Patient learns that restaurant meals can sabotage health goals because they are  often packed with calories, fat, sodium, and sugar. Recommendations include strategies to plan ahead and to communicate with the manager, chef, or server to help order a healthier meal.  Planning Your Eating Strategy  Clinical staff conducted group or individual video education with verbal and written material and guidebook.  Patient learns about the Pritikin Eating Plan and its benefit of reducing the risk of disease. The Pritikin Eating Plan does not focus on calories. Instead, it emphasizes high-quality, nutrient-rich foods. By knowing the characteristics of the foods, we choose, we can determine their calorie density and make informed decisions.  Targeting Your Nutrition Priorities  Clinical staff conducted group or individual video education with verbal and written material and guidebook.  Patient learns that lifestyle habits have a tremendous impact on disease risk and progression. This  video provides eating and physical activity recommendations based on your personal health goals, such as reducing LDL cholesterol, losing weight, preventing or controlling type 2 diabetes, and reducing high blood pressure.  Vitamins and Minerals  Clinical staff conducted group or individual video education with verbal and written material and guidebook.  Patient learns different ways to obtain key vitamins and minerals, including through a recommended healthy diet. It is important to discuss all supplements you take with your doctor.   Healthy Mind-Set    Smoking Cessation  Clinical staff conducted group or individual video education with verbal and written material and guidebook.  Patient learns that cigarette smoking and tobacco addiction pose a serious health risk which affects millions of people. Stopping smoking will significantly reduce the risk of heart disease, lung disease, and many forms of cancer. Recommended strategies for quitting are covered, including working with your doctor to develop a successful plan.  Culinary   Becoming a Set Designer conducted group or individual video education with verbal and written material and guidebook.  Patient learns that cooking at home can be healthy, cost-effective, quick, and puts them in control. Keys to cooking healthy recipes will include looking at your recipe, assessing your equipment needs, planning ahead, making it simple, choosing cost-effective seasonal ingredients, and limiting the use of added fats, salts, and sugars.  Cooking - Breakfast and Snacks  Clinical staff conducted group or individual video education with verbal and written material and guidebook.  Patient learns how important breakfast is to satiety and nutrition through the entire day. Recommendations include key foods to eat during breakfast to help stabilize blood sugar levels and to prevent overeating at meals later in the day. Planning ahead is also a  key component.  Cooking - Educational Psychologist conducted group or individual video education with verbal and written material and guidebook.  Patient learns eating strategies to improve overall health, including an approach to cook more at home. Recommendations include thinking of animal protein as a side on your plate rather than center stage and focusing instead on lower calorie dense options like vegetables, fruits, whole grains, and plant-based proteins, such as beans. Making sauces in large quantities to freeze for later and leaving the skin on your vegetables are also recommended to maximize your experience.  Cooking - Healthy Salads and Dressing Clinical staff conducted group or individual video education with verbal and written material and guidebook.  Patient learns that vegetables, fruits, whole grains, and legumes are the foundations of the Pritikin Eating Plan. Recommendations include how to incorporate each of these in flavorful and healthy salads, and how to create homemade salad dressings. Proper handling of  ingredients is also covered. Cooking - Soups and [employer] - Soups and Desserts Clinical staff conducted group or individual video education with verbal and written material and guidebook.  Patient learns that Pritikin soups and desserts make for easy, nutritious, and delicious snacks and meal components that are low in sodium, fat, sugar, and calorie density, while high in vitamins, minerals, and filling fiber. Recommendations include simple and healthy ideas for soups and desserts.   Overview     The Pritikin Solution Program Overview Clinical staff conducted group or individual video education with verbal and written material and guidebook.  Patient learns that the results of the Pritikin Program have been documented in more than 100 articles published in peer-reviewed journals, and the benefits include reducing risk factors for (and, in some cases, even  reversing) high cholesterol, high blood pressure, type 2 diabetes, obesity, and more! An overview of the three key pillars of the Pritikin Program will be covered: eating well, doing regular exercise, and having a healthy mind-set.  WORKSHOPS  Exercise: Exercise Basics: Building Your Action Plan Clinical staff led group instruction and group discussion with PowerPoint presentation and patient guidebook. To enhance the learning environment the use of posters, models and videos may be added. At the conclusion of this workshop, patients will comprehend the difference between physical activity and exercise, as well as the benefits of incorporating both, into their routine. Patients will understand the FITT (Frequency, Intensity, Time, and Type) principle and how to use it to build an exercise action plan. In addition, safety concerns and other considerations for exercise and cardiac rehab will be addressed by the presenter. The purpose of this lesson is to promote a comprehensive and effective weekly exercise routine in order to improve patients' overall level of fitness.   Managing Heart Disease: Your Path to a Healthier Heart Clinical staff led group instruction and group discussion with PowerPoint presentation and patient guidebook. To enhance the learning environment the use of posters, models and videos may be added.At the conclusion of this workshop, patients will understand the anatomy and physiology of the heart. Additionally, they will understand how Pritikin's three pillars impact the risk factors, the progression, and the management of heart disease.  The purpose of this lesson is to provide a high-level overview of the heart, heart disease, and how the Pritikin lifestyle positively impacts risk factors.  Exercise Biomechanics Clinical staff led group instruction and group discussion with PowerPoint presentation and patient guidebook. To enhance the learning environment the use of  posters, models and videos may be added. Patients will learn how the structural parts of their bodies function and how these functions impact their daily activities, movement, and exercise. Patients will learn how to promote a neutral spine, learn how to manage pain, and identify ways to improve their physical movement in order to promote healthy living. The purpose of this lesson is to expose patients to common physical limitations that impact physical activity. Participants will learn practical ways to adapt and manage aches and pains, and to minimize their effect on regular exercise. Patients will learn how to maintain good posture while sitting, walking, and lifting.  Balance Training and Fall Prevention  Clinical staff led group instruction and group discussion with PowerPoint presentation and patient guidebook. To enhance the learning environment the use of posters, models and videos may be added. At the conclusion of this workshop, patients will understand the importance of their sensorimotor skills (vision, proprioception, and the vestibular system) in maintaining their ability  to balance as they age. Patients will apply a variety of balancing exercises that are appropriate for their current level of function. Patients will understand the common causes for poor balance, possible solutions to these problems, and ways to modify their physical environment in order to minimize their fall risk. The purpose of this lesson is to teach patients about the importance of maintaining balance as they age and ways to minimize their risk of falling.  WORKSHOPS   Nutrition:  Fueling a Ship Broker led group instruction and group discussion with PowerPoint presentation and patient guidebook. To enhance the learning environment the use of posters, models and videos may be added. Patients will review the foundational principles of the Pritikin Eating Plan and understand what constitutes a  serving size in each of the food groups. Patients will also learn Pritikin-friendly foods that are better choices when away from home and review make-ahead meal and snack options. Calorie density will be reviewed and applied to three nutrition priorities: weight maintenance, weight loss, and weight gain. The purpose of this lesson is to reinforce (in a group setting) the key concepts around what patients are recommended to eat and how to apply these guidelines when away from home by planning and selecting Pritikin-friendly options. Patients will understand how calorie density may be adjusted for different weight management goals.  Mindful Eating  Clinical staff led group instruction and group discussion with PowerPoint presentation and patient guidebook. To enhance the learning environment the use of posters, models and videos may be added. Patients will briefly review the concepts of the Pritikin Eating Plan and the importance of low-calorie dense foods. The concept of mindful eating will be introduced as well as the importance of paying attention to internal hunger signals. Triggers for non-hunger eating and techniques for dealing with triggers will be explored. The purpose of this lesson is to provide patients with the opportunity to review the basic principles of the Pritikin Eating Plan, discuss the value of eating mindfully and how to measure internal cues of hunger and fullness using the Hunger Scale. Patients will also discuss reasons for non-hunger eating and learn strategies to use for controlling emotional eating.  Targeting Your Nutrition Priorities Clinical staff led group instruction and group discussion with PowerPoint presentation and patient guidebook. To enhance the learning environment the use of posters, models and videos may be added. Patients will learn how to determine their genetic susceptibility to disease by reviewing their family history. Patients will gain insight into the  importance of diet as part of an overall healthy lifestyle in mitigating the impact of genetics and other environmental insults. The purpose of this lesson is to provide patients with the opportunity to assess their personal nutrition priorities by looking at their family history, their own health history and current risk factors. Patients will also be able to discuss ways of prioritizing and modifying the Pritikin Eating Plan for their highest risk areas  Menu  Clinical staff led group instruction and group discussion with PowerPoint presentation and patient guidebook. To enhance the learning environment the use of posters, models and videos may be added. Using menus brought in from [employer], or printed from [employer], patients will apply the Pritikin dining out guidelines that were presented in the [employer] video. Patients will also be able to practice these guidelines in a variety of provided scenarios. The purpose of this lesson is to provide patients with the opportunity to practice hands-on learning of the Pritikin  Dining Out guidelines with actual menus and practice scenarios.  Label Reading Clinical staff led group instruction and group discussion with PowerPoint presentation and patient guidebook. To enhance the learning environment the use of posters, models and videos may be added. Patients will review and discuss the Pritikin label reading guidelines presented in Pritikin's Label Reading Educational series video. Using fool labels brought in from local grocery stores and markets, patients will apply the label reading guidelines and determine if the packaged food meet the Pritikin guidelines. The purpose of this lesson is to provide patients with the opportunity to review, discuss, and practice hands-on learning of the Pritikin Label Reading guidelines with actual packaged food labels. Cooking School  Pritikin's [employer] are designed to teach  patients ways to prepare quick, simple, and affordable recipes at home. The importance of nutrition's role in chronic disease risk reduction is reflected in its emphasis in the overall Pritikin program. By learning how to prepare essential core Pritikin Eating Plan recipes, patients will increase control over what they eat; be able to customize the flavor of foods without the use of added salt, sugar, or fat; and improve the quality of the food they consume. By learning a set of core recipes which are easily assembled, quickly prepared, and affordable, patients are more likely to prepare more healthy foods at home. These workshops focus on convenient breakfasts, simple entres, side dishes, and desserts which can be prepared with minimal effort and are consistent with nutrition recommendations for cardiovascular risk reduction. Cooking [employer] are taught by a armed forces logistics/support/administrative officer (RD) who has been trained by the [employer]. The chef or RD has a clear understanding of the importance of minimizing - if not completely eliminating - added fat, sugar, and sodium in recipes. Throughout the series of Cooking School Workshop sessions, patients will learn about healthy ingredients and efficient methods of cooking to build confidence in their capability to prepare    Cooking School weekly topics:  Adding Flavor- Sodium-Free  Fast and Healthy Breakfasts  Powerhouse Plant-Based Proteins  Satisfying Salads and Dressings  Simple Sides and Sauces  International Cuisine-Spotlight on the [employer] Zones  Delicious Desserts  Savory Soups  [employer] - Meals in a Astronomer Appetizers and Snacks  Comforting Weekend Breakfasts  One-Pot Wonders   Fast Evening Meals  Landscape Architect Your [facility] Plate  WORKSHOPS   Healthy Mindset (Psychosocial):  Focused Goals, Sustainable Changes Clinical staff led group instruction and group discussion with PowerPoint  presentation and patient guidebook. To enhance the learning environment the use of posters, models and videos may be added. Patients will be able to apply effective goal setting strategies to establish at least one personal goal, and then take consistent, meaningful action toward that goal. They will learn to identify common barriers to achieving personal goals and develop strategies to overcome them. Patients will also gain an understanding of how our mind-set can impact our ability to achieve goals and the importance of cultivating a positive and growth-oriented mind-set. The purpose of this lesson is to provide patients with a deeper understanding of how to set and achieve personal goals, as well as the tools and strategies needed to overcome common obstacles which may arise along the way.  From Head to Heart: The Power of a Healthy Outlook  Clinical staff led group instruction and group discussion with PowerPoint presentation and patient guidebook. To enhance the learning environment the use of posters, models and videos  may be added. Patients will be able to recognize and describe the impact of emotions and mood on physical health. They will discover the importance of self-care and explore self-care practices which may work for them. Patients will also learn how to utilize the 4 C's to cultivate a healthier outlook and better manage stress and challenges. The purpose of this lesson is to demonstrate to patients how a healthy outlook is an essential part of maintaining good health, especially as they continue their cardiac rehab journey.  Healthy Sleep for a Healthy Heart Clinical staff led group instruction and group discussion with PowerPoint presentation and patient guidebook. To enhance the learning environment the use of posters, models and videos may be added. At the conclusion of this workshop, patients will be able to demonstrate knowledge of the importance of sleep to overall health, well-being,  and quality of life. They will understand the symptoms of, and treatments for, common sleep disorders. Patients will also be able to identify daytime and nighttime behaviors which impact sleep, and they will be able to apply these tools to help manage sleep-related challenges. The purpose of this lesson is to provide patients with a general overview of sleep and outline the importance of quality sleep. Patients will learn about a few of the most common sleep disorders. Patients will also be introduced to the concept of "sleep hygiene," and discover ways to self-manage certain sleeping problems through simple daily behavior changes. Finally, the workshop will motivate patients by clarifying the links between quality sleep and their goals of heart-healthy living.   Recognizing and Reducing Stress Clinical staff led group instruction and group discussion with PowerPoint presentation and patient guidebook. To enhance the learning environment the use of posters, models and videos may be added. At the conclusion of this workshop, patients will be able to understand the types of stress reactions, differentiate between acute and chronic stress, and recognize the impact that chronic stress has on their health. They will also be able to apply different coping mechanisms, such as reframing negative self-talk. Patients will have the opportunity to practice a variety of stress management techniques, such as deep abdominal breathing, progressive muscle relaxation, and/or guided imagery.  The purpose of this lesson is to educate patients on the role of stress in their lives and to provide healthy techniques for coping with it.  Learning Barriers/Preferences:  Learning Barriers/Preferences - 01/19/24 1112       Learning Barriers/Preferences   Learning Barriers None    Learning Preferences Computer/Internet;Individual Instruction;Pictoral;Verbal Instruction;Video;Written Material          Education  Topics:  Knowledge Questionnaire Score:  Knowledge Questionnaire Score - 01/19/24 1423       Knowledge Questionnaire Score   Pre Score 22/24          Core Components/Risk Factors/Patient Goals at Admission:  Personal Goals and Risk Factors at Admission - 01/19/24 1216       Core Components/Risk Factors/Patient Goals on Admission   Diabetes Yes    Intervention Provide education about signs/symptoms and action to take for hypo/hyperglycemia.;Provide education about proper nutrition, including hydration, and aerobic/resistive exercise prescription along with prescribed medications to achieve blood glucose in normal ranges: Fasting glucose 65-99 mg/dL    Expected Outcomes Short Term: Participant verbalizes understanding of the signs/symptoms and immediate care of hyper/hypoglycemia, proper foot care and importance of medication, aerobic/resistive exercise and nutrition plan for blood glucose control.;Long Term: Attainment of HbA1C < 7%.    Heart Failure Yes  Intervention Provide a combined exercise and nutrition program that is supplemented with education, support and counseling about heart failure. Directed toward relieving symptoms such as shortness of breath, decreased exercise tolerance, and extremity edema.    Expected Outcomes Short term: Attendance in program 2-3 days a week with increased exercise capacity. Reported lower sodium intake. Reported increased fruit and vegetable intake. Reports medication compliance.;Improve functional capacity of life;Short term: Daily weights obtained and reported for increase. Utilizing diuretic protocols set by physician.;Long term: Adoption of self-care skills and reduction of barriers for early signs and symptoms recognition and intervention leading to self-care maintenance.    Hypertension Yes    Intervention Provide education on lifestyle modifcations including regular physical activity/exercise, weight management, moderate sodium restriction and  increased consumption of fresh fruit, vegetables, and low fat dairy, alcohol moderation, and smoking cessation.;Monitor prescription use compliance.    Expected Outcomes Short Term: Continued assessment and intervention until BP is < 140/2mm HG in hypertensive participants. < 130/65mm HG in hypertensive participants with diabetes, heart failure or chronic kidney disease.;Long Term: Maintenance of blood pressure at goal levels.    Lipids Yes    Intervention Provide education and support for participant on nutrition & aerobic/resistive exercise along with prescribed medications to achieve LDL 70mg , HDL >40mg .    Expected Outcomes Short Term: Participant states understanding of desired cholesterol values and is compliant with medications prescribed. Participant is following exercise prescription and nutrition guidelines.;Long Term: Cholesterol controlled with medications as prescribed, with individualized exercise RX and with personalized nutrition plan. Value goals: LDL < 70mg , HDL > 40 mg.          Core Components/Risk Factors/Patient Goals Review:   Goals and Risk Factor Review     Row Name 02/09/24 0900 02/23/24 1222           Core Components/Risk Factors/Patient Goals Review   Personal Goals Review Lipids;Heart Failure;Hypertension;Diabetes;Stress Lipids;Heart Failure;Hypertension;Diabetes;Stress      Review Leeroy started cardiac rehab on 02/05/24. Emoni is off to a good start to exercise. Vital signs and CBG's have been stable. Xzavian has not had any further episodes or symptoms since 01/24/24. Edin has a hypotensive episode on 02/17/24. Kingson's symptoms improved after water given. Will continue to monitor. Abdou exercised on 02/19/24 without symptoms      Expected Outcomes Dymond will continue to participate in cardiac rehab for exercise, nutrition and lifestyle modifications Ramone will continue to participate in cardiac rehab for exercise, nutrition and lifestyle modifications         Core  Components/Risk Factors/Patient Goals at Discharge (Final Review):   Goals and Risk Factor Review - 02/23/24 1222       Core Components/Risk Factors/Patient Goals Review   Personal Goals Review Lipids;Heart Failure;Hypertension;Diabetes;Stress    Review Erek has a hypotensive episode on 02/17/24. Jowel's symptoms improved after water given. Will continue to monitor. Livio exercised on 02/19/24 without symptoms    Expected Outcomes Denney will continue to participate in cardiac rehab for exercise, nutrition and lifestyle modifications          ITP Comments:  ITP Comments     Row Name 01/19/24 1018 01/25/24 1703 02/09/24 9147       ITP Comments Medical Director- Dr. Wilbert Bihari, MD. Introduction to the [facility]/ Intensive Cardiac Rehab Program. Initial folder reviewed with Prentice. 30 Day ITP Review. Coreon is tenatively scheduled to start cardiac rehab on 01/29/24. 30 Day ITP Review. Pike started cardiac rehab on 02/05/20. Ac is off to a good start  to exercise.        Comments: See ITP comments.Hadassah Elpidio Quan RN BSN

## 2024-02-24 ENCOUNTER — Encounter (HOSPITAL_COMMUNITY)

## 2024-02-24 ENCOUNTER — Encounter (HOSPITAL_COMMUNITY)
Admission: RE | Admit: 2024-02-24 | Discharge: 2024-02-24 | Disposition: A | Discharge status: Home / Self Care | Source: Ambulatory Visit | Attending: Cardiovascular Disease | Admitting: Cardiovascular Disease

## 2024-02-24 DIAGNOSIS — Z48812 Encounter for surgical aftercare following surgery on the circulatory system: Secondary | ICD-10-CM | POA: Insufficient documentation

## 2024-02-24 DIAGNOSIS — I5022 Chronic systolic (congestive) heart failure: Secondary | ICD-10-CM

## 2024-02-24 DIAGNOSIS — Z955 Presence of coronary angioplasty implant and graft: Secondary | ICD-10-CM | POA: Insufficient documentation

## 2024-02-26 ENCOUNTER — Encounter (HOSPITAL_COMMUNITY)

## 2024-02-26 ENCOUNTER — Encounter (HOSPITAL_COMMUNITY)
Admission: RE | Admit: 2024-02-26 | Discharge: 2024-02-26 | Disposition: A | Discharge status: Home / Self Care | Source: Ambulatory Visit | Attending: Cardiovascular Disease | Admitting: Cardiovascular Disease

## 2024-02-26 DIAGNOSIS — Z955 Presence of coronary angioplasty implant and graft: Secondary | ICD-10-CM

## 2024-02-26 DIAGNOSIS — I5022 Chronic systolic (congestive) heart failure: Secondary | ICD-10-CM

## 2024-02-26 DIAGNOSIS — Z48812 Encounter for surgical aftercare following surgery on the circulatory system: Secondary | ICD-10-CM | POA: Diagnosis not present

## 2024-02-29 ENCOUNTER — Ambulatory Visit: Attending: Cardiology | Admitting: Cardiology

## 2024-02-29 ENCOUNTER — Encounter (HOSPITAL_COMMUNITY): Admission: RE | Admit: 2024-02-29 | Discharge: 2024-02-29 | Disposition: A | Source: Ambulatory Visit

## 2024-02-29 ENCOUNTER — Encounter: Payer: Self-pay | Admitting: Cardiology

## 2024-02-29 ENCOUNTER — Encounter (HOSPITAL_COMMUNITY)

## 2024-02-29 ENCOUNTER — Other Ambulatory Visit: Payer: Self-pay

## 2024-02-29 VITALS — BP 114/68 | HR 74 | Ht 70.0 in | Wt 172.0 lb

## 2024-02-29 DIAGNOSIS — I2510 Atherosclerotic heart disease of native coronary artery without angina pectoris: Secondary | ICD-10-CM

## 2024-02-29 DIAGNOSIS — I472 Ventricular tachycardia, unspecified: Secondary | ICD-10-CM

## 2024-02-29 DIAGNOSIS — Z79899 Other long term (current) drug therapy: Secondary | ICD-10-CM

## 2024-02-29 DIAGNOSIS — I5022 Chronic systolic (congestive) heart failure: Secondary | ICD-10-CM

## 2024-02-29 DIAGNOSIS — Z955 Presence of coronary angioplasty implant and graft: Secondary | ICD-10-CM

## 2024-02-29 DIAGNOSIS — Z48812 Encounter for surgical aftercare following surgery on the circulatory system: Secondary | ICD-10-CM | POA: Diagnosis not present

## 2024-02-29 LAB — CUP PACEART INCLINIC DEVICE CHECK
Date Time Interrogation Session: 20251110124442
HighPow Impedance: 63 Ohm
Implantable Lead Connection Status: 753985
Implantable Lead Implant Date: 20250801
Implantable Lead Location: 753860
Implantable Pulse Generator Implant Date: 20250801
Lead Channel Impedance Value: 551 Ohm
Lead Channel Pacing Threshold Amplitude: 1 V
Lead Channel Pacing Threshold Amplitude: 1 V
Lead Channel Pacing Threshold Pulse Width: 0.4 ms
Lead Channel Pacing Threshold Pulse Width: 0.4 ms
Lead Channel Sensing Intrinsic Amplitude: 15.3 mV

## 2024-02-29 MED ORDER — SACUBITRIL-VALSARTAN 24-26 MG PO TABS: 1.0000 | ORAL_TABLET | Freq: Two times a day (BID) | ORAL | 2 refills | Status: AC

## 2024-02-29 NOTE — Progress Notes (Signed)
  Electrophysiology Office Note:   Date:  02/29/2024  ID:  Jonathan Baney., DOB 04-30-1950, MRN [id number]  Primary Cardiologist: Lonni Cash, MD Primary Heart Failure: None Electrophysiologist: Macenzie Burford Gladis Norton, MD      History of Present Illness:   Jonathan Sperling. is a 73 y.o. male with h/o diabetes, hyperlipidemia coronary artery disease post LAD PCI, ischemic cardiomyopathy, CHF seen today for routine electrophysiology followup.   Since last being seen in our clinic the patient reports doing well.  He has no acute complaints.  He does get dizzy sometimes when he has been working out with cardiac rehab, but otherwise has been doing well.  he denies chest pain, palpitations, dyspnea, PND, orthopnea, nausea, vomiting, dizziness, syncope, edema, weight gain, or early satiety.   Review of systems complete and found to be negative unless listed in HPI.      EP Information / Studies Reviewed:    EKG is ordered today. Personal review as below.  EKG Interpretation Date/Time:  Monday February 29 2024 11:16:12 EST Ventricular Rate:  74 PR Interval:  160 QRS Duration:  166 QT Interval:  432 QTC Calculation: 479 R Axis:   145  Text Interpretation: Normal sinus rhythm Right bundle branch block Left posterior fascicular block Bifascicular block When compared with ECG of 24-Nov-2023 14:10, Premature ventricular complexes are no longer Present Confirmed by Susie Ehresman ([id number]) on 02/29/2024 11:23:14 AM   ICD Interrogation-  reviewed in detail today,  See PACEART report.  Device History: Medtronic Single Chamber ICD implanted 11/20/2023 for ventricular tachycardia History of appropriate therapy: No History of AAD therapy: Yes; currently on amiodarone    Risk Assessment/Calculations:            Physical Exam:   VS:  BP 114/68 (BP Location: Left Arm, Patient Position: Sitting, Cuff Size: Normal)   Pulse 74   Ht 5' 10 (1.778 m)   Wt 172 lb (78 kg)   SpO2 95%   BMI  24.68 kg/m    Wt Readings from Last 3 Encounters:  02/29/24 172 lb (78 kg)  02/03/24 165 lb 6.4 oz (75 kg)  01/19/24 170 lb 6.7 oz (77.3 kg)     GEN: Well nourished, well developed in no acute distress NECK: No JVD; No carotid bruits CARDIAC: Regular rate and rhythm, no murmurs, rubs, gallops RESPIRATORY:  Clear to auscultation without rales, wheezing or rhonchi  ABDOMEN: Soft, non-tender, non-distended EXTREMITIES:  No edema; No deformity   ASSESSMENT AND PLAN:    Chronic systolic dysfunction s/p Medtronic single chamber ICD  euvolemic today Stable on an appropriate medical regimen Normal ICD function See [facility] report No changes today  2.  Coronary artery disease: Post PCI to the LAD.  Plan per primary cardiology.  3.  PVCs/VT: On amiodarone .  Minimal noted.  No arrhythmias on device interrogation.  No changes.  4.  High risk medication monitoring: On amiodarone .  Jonathan Cervantes check TSH and LFTs today  Disposition:   Follow up with EP Team in 12 months   Signed, Donnovan Stamour Gladis Norton, MD

## 2024-02-29 NOTE — Addendum Note (Signed)
 Addended by: CHAUVIGNE, Tricha Ruggirello on: 02/29/2024 11:49 AM   Modules accepted: Orders

## 2024-02-29 NOTE — Addendum Note (Signed)
 Addended by: BETHENA POWELL SAUNDERS on: 02/29/2024 09:43 AM   Modules accepted: Orders

## 2024-02-29 NOTE — Patient Instructions (Signed)

## 2024-03-01 ENCOUNTER — Ambulatory Visit: Payer: Self-pay | Admitting: Cardiology

## 2024-03-01 LAB — COMPREHENSIVE METABOLIC PANEL WITH GFR
ALT: 14 IU/L (ref 0–44)
AST: 17 IU/L (ref 0–40)
Albumin: 4.3 g/dL (ref 3.8–4.8)
Alkaline Phosphatase: 56 IU/L (ref 47–123)
BUN/Creatinine Ratio: 18 (ref 10–24)
BUN: 23 mg/dL (ref 8–27)
Bilirubin Total: 0.4 mg/dL (ref 0.0–1.2)
CO2: 22 mmol/L (ref 20–29)
Calcium: 9.2 mg/dL (ref 8.6–10.2)
Chloride: 104 mmol/L (ref 96–106)
Creatinine, Ser: 1.3 mg/dL — ABNORMAL HIGH (ref 0.76–1.27)
Globulin, Total: 2 g/dL (ref 1.5–4.5)
Glucose: 167 mg/dL — ABNORMAL HIGH (ref 70–99)
Potassium: 4.3 mmol/L (ref 3.5–5.2)
Sodium: 140 mmol/L (ref 134–144)
Total Protein: 6.3 g/dL (ref 6.0–8.5)
eGFR: 59 mL/min/1.73 — ABNORMAL LOW (ref >59)

## 2024-03-01 LAB — T4, FREE: Free T4: 1.48 ng/dL (ref 0.82–1.77)

## 2024-03-01 LAB — TSH: TSH: 4.16 u[IU]/mL (ref 0.450–4.500)

## 2024-03-02 ENCOUNTER — Encounter (HOSPITAL_COMMUNITY)

## 2024-03-02 ENCOUNTER — Encounter (HOSPITAL_COMMUNITY)
Admission: RE | Admit: 2024-03-02 | Discharge: 2024-03-02 | Disposition: A | Discharge status: Home / Self Care | Source: Ambulatory Visit | Attending: Cardiovascular Disease | Admitting: Cardiovascular Disease

## 2024-03-02 DIAGNOSIS — I5022 Chronic systolic (congestive) heart failure: Secondary | ICD-10-CM

## 2024-03-02 DIAGNOSIS — Z48812 Encounter for surgical aftercare following surgery on the circulatory system: Secondary | ICD-10-CM | POA: Diagnosis not present

## 2024-03-02 DIAGNOSIS — Z955 Presence of coronary angioplasty implant and graft: Secondary | ICD-10-CM

## 2024-03-04 ENCOUNTER — Encounter (HOSPITAL_COMMUNITY)

## 2024-03-04 ENCOUNTER — Encounter (HOSPITAL_COMMUNITY)
Admission: RE | Admit: 2024-03-04 | Discharge: 2024-03-04 | Disposition: A | Discharge status: Home / Self Care | Source: Ambulatory Visit | Attending: Cardiovascular Disease | Admitting: Cardiovascular Disease

## 2024-03-04 DIAGNOSIS — Z48812 Encounter for surgical aftercare following surgery on the circulatory system: Secondary | ICD-10-CM | POA: Diagnosis not present

## 2024-03-04 DIAGNOSIS — Z955 Presence of coronary angioplasty implant and graft: Secondary | ICD-10-CM

## 2024-03-04 DIAGNOSIS — I5022 Chronic systolic (congestive) heart failure: Secondary | ICD-10-CM

## 2024-03-07 ENCOUNTER — Encounter (HOSPITAL_COMMUNITY)
Admission: RE | Admit: 2024-03-07 | Discharge: 2024-03-07 | Disposition: A | Source: Ambulatory Visit | Attending: Cardiovascular Disease

## 2024-03-07 ENCOUNTER — Encounter (HOSPITAL_COMMUNITY)

## 2024-03-07 DIAGNOSIS — I5022 Chronic systolic (congestive) heart failure: Secondary | ICD-10-CM

## 2024-03-07 DIAGNOSIS — Z48812 Encounter for surgical aftercare following surgery on the circulatory system: Secondary | ICD-10-CM | POA: Diagnosis not present

## 2024-03-07 DIAGNOSIS — Z955 Presence of coronary angioplasty implant and graft: Secondary | ICD-10-CM

## 2024-03-09 ENCOUNTER — Encounter (HOSPITAL_COMMUNITY)
Admission: RE | Admit: 2024-03-09 | Discharge: 2024-03-09 | Disposition: A | Discharge status: Home / Self Care | Source: Ambulatory Visit | Attending: Cardiovascular Disease | Admitting: Cardiovascular Disease

## 2024-03-09 ENCOUNTER — Encounter (HOSPITAL_COMMUNITY)

## 2024-03-09 DIAGNOSIS — I5022 Chronic systolic (congestive) heart failure: Secondary | ICD-10-CM

## 2024-03-09 DIAGNOSIS — Z48812 Encounter for surgical aftercare following surgery on the circulatory system: Secondary | ICD-10-CM | POA: Diagnosis not present

## 2024-03-09 DIAGNOSIS — Z955 Presence of coronary angioplasty implant and graft: Secondary | ICD-10-CM

## 2024-03-11 ENCOUNTER — Encounter (HOSPITAL_COMMUNITY)

## 2024-03-11 ENCOUNTER — Encounter (HOSPITAL_COMMUNITY)
Admission: RE | Admit: 2024-03-11 | Discharge: 2024-03-11 | Disposition: A | Discharge status: Home / Self Care | Source: Ambulatory Visit | Attending: Cardiovascular Disease | Admitting: Cardiovascular Disease

## 2024-03-11 DIAGNOSIS — Z955 Presence of coronary angioplasty implant and graft: Secondary | ICD-10-CM

## 2024-03-11 DIAGNOSIS — Z48812 Encounter for surgical aftercare following surgery on the circulatory system: Secondary | ICD-10-CM | POA: Diagnosis not present

## 2024-03-11 DIAGNOSIS — I5022 Chronic systolic (congestive) heart failure: Secondary | ICD-10-CM

## 2024-03-14 ENCOUNTER — Encounter (HOSPITAL_COMMUNITY)
Admission: RE | Admit: 2024-03-14 | Discharge: 2024-03-14 | Disposition: A | Source: Ambulatory Visit | Attending: Cardiovascular Disease

## 2024-03-14 ENCOUNTER — Encounter (HOSPITAL_COMMUNITY)

## 2024-03-14 DIAGNOSIS — Z955 Presence of coronary angioplasty implant and graft: Secondary | ICD-10-CM

## 2024-03-14 DIAGNOSIS — I5022 Chronic systolic (congestive) heart failure: Secondary | ICD-10-CM

## 2024-03-14 DIAGNOSIS — Z48812 Encounter for surgical aftercare following surgery on the circulatory system: Secondary | ICD-10-CM | POA: Diagnosis not present

## 2024-03-16 ENCOUNTER — Encounter (HOSPITAL_COMMUNITY)

## 2024-03-16 ENCOUNTER — Other Ambulatory Visit: Payer: Self-pay | Admitting: Pharmacist

## 2024-03-16 ENCOUNTER — Encounter: Payer: Self-pay | Admitting: Pharmacist

## 2024-03-16 ENCOUNTER — Encounter (HOSPITAL_COMMUNITY)
Admission: RE | Admit: 2024-03-16 | Discharge: 2024-03-16 | Disposition: A | Discharge status: Home / Self Care | Source: Ambulatory Visit | Attending: Cardiovascular Disease | Admitting: Cardiovascular Disease

## 2024-03-16 DIAGNOSIS — I5022 Chronic systolic (congestive) heart failure: Secondary | ICD-10-CM

## 2024-03-16 DIAGNOSIS — Z48812 Encounter for surgical aftercare following surgery on the circulatory system: Secondary | ICD-10-CM | POA: Diagnosis not present

## 2024-03-16 DIAGNOSIS — Z955 Presence of coronary angioplasty implant and graft: Secondary | ICD-10-CM

## 2024-03-16 DIAGNOSIS — E785 Hyperlipidemia, unspecified: Secondary | ICD-10-CM

## 2024-03-18 ENCOUNTER — Encounter (HOSPITAL_COMMUNITY)

## 2024-03-21 ENCOUNTER — Encounter (HOSPITAL_COMMUNITY)

## 2024-03-23 ENCOUNTER — Encounter (HOSPITAL_COMMUNITY)

## 2024-03-23 ENCOUNTER — Encounter (HOSPITAL_COMMUNITY)
Admission: RE | Admit: 2024-03-23 | Discharge: 2024-03-23 | Disposition: A | Discharge status: Home / Self Care | Source: Ambulatory Visit | Attending: Cardiovascular Disease | Admitting: Cardiovascular Disease

## 2024-03-23 DIAGNOSIS — Z955 Presence of coronary angioplasty implant and graft: Secondary | ICD-10-CM | POA: Diagnosis present

## 2024-03-23 DIAGNOSIS — I5022 Chronic systolic (congestive) heart failure: Secondary | ICD-10-CM | POA: Insufficient documentation

## 2024-03-23 NOTE — Progress Notes (Signed)
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 Education Topics Pritikin   Geographical Information Systems Officer Exercise   Exercise Workshop Location Manager and Fall Prevention   Instruction Review Code 1- Verbalizes Understanding   Class Start Time 1408   Class Stop Time 1500   Class Time Calculation (min) 52 min    Row Name 02/17/24 1400     Education   Cardiac Education Topics Pritikin   Customer Service Manager   Weekly Topic Fast and Healthy Breakfasts   Instruction Review  Code 1- Verbalizes Understanding   Class Start Time 1400   Class Stop Time 1442   Class Time Calculation (min) 42 min    Row Name 02/19/24 1400     Education   Cardiac Education Topics Pritikin   Licensed Conveyancer Nutrition   Nutrition Other  Label Reading   Instruction Review Code 1- Verbalizes Understanding   Class Start Time 1358   Class Stop Time 1456   Class Time Calculation (min) 58 min    Row Name 02/24/24 1400     Education   Cardiac Education Topics Pritikin   Customer Service Manager   Weekly Topic Personalizing Your Pritikin Plate   Instruction Review Code 1- Verbalizes Understanding   Class Start Time 1358   Class Stop Time 1436   Class Time Calculation (min) 38 min    Row Name 02/26/24 1300     Education   Cardiac Education Topics Pritikin   Licensed Conveyancer Nutrition   Nutrition Overview of the Pritikin Eating Plan   Instruction Review Code 1- Verbalizes Understanding   Class Start Time 1400   Class Stop Time 1444   Class Time Calculation (min) 44 min    Row Name 02/29/24 1400     Education   Cardiac Education Topics Pritikin   [employer] Workshops     Workshops   Educator Exercise Physiologist   [employer] Psychosocial   Psychosocial Workshop Recognizing and Reducing Stress   Instruction Review Code 1- Verbalizes Understanding   Class Start Time 1359   Class Stop Time 1445   Class Time Calculation (min) 46 min    Row Name 03/02/24 1300     Education   Cardiac Education Topics Pritikin   Orthoptist   Educator Dietitian   Weekly Topic Delicious Desserts   Instruction Review Code 1- Verbalizes Understanding   Class Start Time 1400   Class Stop Time 1438   Class Time Calculation (min) 38 min    Row Name 03/04/24 1500     Education   Cardiac Education Topics Pritikin    Nurse, Children's   Educator Dietitian   [employer] Nutrition   Nutrition Calorie Density   Instruction Review Code 1- Verbalizes Understanding   Class Start Time 1400   Class Stop Time 1435   Class Time Calculation (min) 35 min    Row Name 03/07/24 1400  Education   Cardiac Education Topics Pritikin   Psychologist, Forensic Exercise Education   Exercise Education Move It!   Instruction Review Code 1- Verbalizes Understanding   Class Start Time 1412   Class Stop Time 1447   Class Time Calculation (min) 35 min    Row Name 03/09/24 1400     Education   Cardiac Education Topics Pritikin   Customer Service Manager   Weekly Topic Efficiency Cooking - Meals in a Snap   Instruction Review Code 1- Verbalizes Understanding   Class Start Time 1400   Class Stop Time 1444   Class Time Calculation (min) 44 min    Row Name 03/11/24 1300     Education   Cardiac Education Topics Pritikin   Geographical Information Systems Officer Exercise   Exercise Workshop Exercise Basics: Diplomatic Services Operational Officer   Instruction Review Code 1- Verbalizes Understanding   Class Start Time 1400   Class Stop Time 1452   Class Time Calculation (min) 52 min    Row Name 03/14/24 1400     Education   Cardiac Education Topics Pritikin   Glass Blower/designer Nutrition   Nutrition Workshop Targeting Your Nutrition Priorities   Instruction Review Code 1- Verbalizes Understanding   Class Start Time 1400   Class Stop Time 1445   Class Time Calculation (min) 45 min      Core Videos: Exercise    Move It!  Clinical staff conducted group or individual video education with verbal and written material and guidebook.  Patient learns the recommended Pritikin exercise program. Exercise with the goal of living a long,  healthy life. Some of the health benefits of exercise include controlled diabetes, healthier blood pressure levels, improved cholesterol levels, improved heart and lung capacity, improved sleep, and better body composition. Everyone should speak with their doctor before starting or changing an exercise routine.  Biomechanical Limitations Clinical staff conducted group or individual video education with verbal and written material and guidebook.  Patient learns how biomechanical limitations can impact exercise and how we can mitigate and possibly overcome limitations to have an impactful and balanced exercise routine.  Body Composition Clinical staff conducted group or individual video education with verbal and written material and guidebook.  Patient learns that body composition (ratio of muscle mass to fat mass) is a key component to assessing overall fitness, rather than body weight alone. Increased fat mass, especially visceral belly fat, can put us  at increased risk for metabolic syndrome, type 2 diabetes, heart disease, and even death. It is recommended to combine diet and exercise (cardiovascular and resistance training) to improve your body composition. Seek guidance from your physician and exercise physiologist before implementing an exercise routine.  Exercise Action Plan Clinical staff conducted group or individual video education with verbal and written material and guidebook.  Patient learns the recommended strategies to achieve and enjoy long-term exercise adherence, including variety, self-motivation, self-efficacy, and positive decision making. Benefits of exercise include fitness, good health, weight management, more energy, better sleep, less stress, and overall well-being.  Medical   Heart Disease Risk Reduction Clinical staff conducted group or individual video education with verbal and written material and guidebook.  Patient learns our heart is our most vital  organ as it  circulates oxygen, nutrients, white blood cells, and hormones throughout the entire body, and carries waste away. Data supports a plant-based eating plan like the Pritikin Program for its effectiveness in slowing progression of and reversing heart disease. The video provides a number of recommendations to address heart disease.   Metabolic Syndrome and Belly Fat  Clinical staff conducted group or individual video education with verbal and written material and guidebook.  Patient learns what metabolic syndrome is, how it leads to heart disease, and how one can reverse it and keep it from coming back. You have metabolic syndrome if you have 3 of the following 5 criteria: abdominal obesity, high blood pressure, high triglycerides, low HDL cholesterol, and high blood sugar.  Hypertension and Heart Disease Clinical staff conducted group or individual video education with verbal and written material and guidebook.  Patient learns that high blood pressure, or hypertension, is very common in the [place] . Hypertension is largely due to excessive salt intake, but other important risk factors include being overweight, physical inactivity, drinking too much alcohol, smoking, and not eating enough potassium from fruits and vegetables. High blood pressure is a leading risk factor for heart attack, stroke, congestive heart failure, dementia, kidney failure, and premature death. Long-term effects of excessive salt intake include stiffening of the arteries and thickening of heart muscle and organ damage. Recommendations include ways to reduce hypertension and the risk of heart disease.  Diseases of Our Time - Focusing on Diabetes Clinical staff conducted group or individual video education with verbal and written material and guidebook.  Patient learns why the best way to stop diseases of our time is prevention, through food and other lifestyle changes. Medicine (such as prescription pills and surgeries) is often  only a Band-Aid on the problem, not a long-term solution. Most common diseases of our time include obesity, type 2 diabetes, hypertension, heart disease, and cancer. The Pritikin Program is recommended and has been proven to help reduce, reverse, and/or prevent the damaging effects of metabolic syndrome.  Nutrition   Overview of the Pritikin Eating Plan  Clinical staff conducted group or individual video education with verbal and written material and guidebook.  Patient learns about the Pritikin Eating Plan for disease risk reduction. The Pritikin Eating Plan emphasizes a wide variety of unrefined, minimally-processed carbohydrates, like fruits, vegetables, whole grains, and legumes. Go, Caution, and Stop food choices are explained. Plant-based and lean animal proteins are emphasized. Rationale provided for low sodium intake for blood pressure control, low added sugars for blood sugar stabilization, and low added fats and oils for coronary artery disease risk reduction and weight management.  Calorie Density  Clinical staff conducted group or individual video education with verbal and written material and guidebook.  Patient learns about calorie density and how it impacts the Pritikin Eating Plan. Knowing the characteristics of the food you choose will help you decide whether those foods will lead to weight gain or weight loss, and whether you want to consume more or less of them. Weight loss is usually a side effect of the Pritikin Eating Plan because of its focus on low calorie-dense foods.  Label Reading  Clinical staff conducted group or individual video education with verbal and written material and guidebook.  Patient learns about the Pritikin recommended label reading guidelines and corresponding recommendations regarding calorie density, added sugars, sodium content, and whole grains.  Dining Out - Part 1  Clinical staff conducted group or individual video education with verbal and  written  material and guidebook.  Patient learns that restaurant meals can be sabotaging because they can be so high in calories, fat, sodium, and/or sugar. Patient learns recommended strategies on how to positively address this and avoid unhealthy pitfalls.  Facts on Fats  Clinical staff conducted group or individual video education with verbal and written material and guidebook.  Patient learns that lifestyle modifications can be just as effective, if not more so, as many medications for lowering your risk of heart disease. A Pritikin lifestyle can help to reduce your risk of inflammation and atherosclerosis (cholesterol build-up, or plaque, in the artery walls). Lifestyle interventions such as dietary choices and physical activity address the cause of atherosclerosis. A review of the types of fats and their impact on blood cholesterol levels, along with dietary recommendations to reduce fat intake is also included.  Nutrition Action Plan  Clinical staff conducted group or individual video education with verbal and written material and guidebook.  Patient learns how to incorporate Pritikin recommendations into their lifestyle. Recommendations include planning and keeping personal health goals in mind as an important part of their success.  Healthy Mind-Set    Healthy Minds, Bodies, Hearts  Clinical staff conducted group or individual video education with verbal and written material and guidebook.  Patient learns how to identify when they are stressed. Video will discuss the impact of that stress, as well as the many benefits of stress management. Patient will also be introduced to stress management techniques. The way we think, act, and feel has an impact on our hearts.  How Our Thoughts Can Heal Our Hearts  Clinical staff conducted group or individual video education with verbal and written material and guidebook.  Patient learns that negative thoughts can cause depression and anxiety. This can result in  negative lifestyle behavior and serious health problems. Cognitive behavioral therapy is an effective method to help control our thoughts in order to change and improve our emotional outlook.  Additional Videos:  Exercise    Improving Performance  Clinical staff conducted group or individual video education with verbal and written material and guidebook.  Patient learns to use a non-linear approach by alternating intensity levels and lengths of time spent exercising to help burn more calories and lose more body fat. Cardiovascular exercise helps improve heart health, metabolism, hormonal balance, blood sugar control, and recovery from fatigue. Resistance training improves strength, endurance, balance, coordination, reaction time, metabolism, and muscle mass. Flexibility exercise improves circulation, posture, and balance. Seek guidance from your physician and exercise physiologist before implementing an exercise routine and learn your capabilities and proper form for all exercise.  Introduction to Yoga  Clinical staff conducted group or individual video education with verbal and written material and guidebook.  Patient learns about yoga, a discipline of the coming together of mind, breath, and body. The benefits of yoga include improved flexibility, improved range of motion, better posture and core strength, increased lung function, weight loss, and positive self-image. Yoga's heart health benefits include lowered blood pressure, healthier heart rate, decreased cholesterol and triglyceride levels, improved immune function, and reduced stress. Seek guidance from your physician and exercise physiologist before implementing an exercise routine and learn your capabilities and proper form for all exercise.  Medical   Aging: Enhancing Your Quality of Life  Clinical staff conducted group or individual video education with verbal and written material and guidebook.  Patient learns key strategies and  recommendations to stay in good physical health and enhance quality of life, such  as prevention strategies, having an advocate, securing a Health Care Proxy and Power of Attorney, and keeping a list of medications and system for tracking them. It also discusses how to avoid risk for bone loss.  Biology of Weight Control  Clinical staff conducted group or individual video education with verbal and written material and guidebook.  Patient learns that weight gain occurs because we consume more calories than we burn (eating more, moving less). Even if your body weight is normal, you may have higher ratios of fat compared to muscle mass. Too much body fat puts you at increased risk for cardiovascular disease, heart attack, stroke, type 2 diabetes, and obesity-related cancers. In addition to exercise, following the Pritikin Eating Plan can help reduce your risk.  Decoding Lab Results  Clinical staff conducted group or individual video education with verbal and written material and guidebook.  Patient learns that lab test reflects one measurement whose values change over time and are influenced by many factors, including medication, stress, sleep, exercise, food, hydration, pre-existing medical conditions, and more. It is recommended to use the knowledge from this video to become more involved with your lab results and evaluate your numbers to speak with your doctor.   Diseases of Our Time - Overview  Clinical staff conducted group or individual video education with verbal and written material and guidebook.  Patient learns that according to the CDC, 50% to 70% of chronic diseases (such as obesity, type 2 diabetes, elevated lipids, hypertension, and heart disease) are avoidable through lifestyle improvements including healthier food choices, listening to satiety cues, and increased physical activity.  Sleep Disorders Clinical staff conducted group or individual video education with verbal and written  material and guidebook.  Patient learns how good quality and duration of sleep are important to overall health and well-being. Patient also learns about sleep disorders and how they impact health along with recommendations to address them, including discussing with a physician.  Nutrition  Dining Out - Part 2 Clinical staff conducted group or individual video education with verbal and written material and guidebook.  Patient learns how to plan ahead and communicate in order to maximize their dining experience in a healthy and nutritious manner. Included are recommended food choices based on the type of restaurant the patient is visiting.   Fueling a Banker conducted group or individual video education with verbal and written material and guidebook.  There is a strong connection between our food choices and our health. Diseases like obesity and type 2 diabetes are very prevalent and are in large-part due to lifestyle choices. The Pritikin Eating Plan provides plenty of food and hunger-curbing satisfaction. It is easy to follow, affordable, and helps reduce health risks.  Menu Workshop  Clinical staff conducted group or individual video education with verbal and written material and guidebook.  Patient learns that restaurant meals can sabotage health goals because they are often packed with calories, fat, sodium, and sugar. Recommendations include strategies to plan ahead and to communicate with the manager, chef, or server to help order a healthier meal.  Planning Your Eating Strategy  Clinical staff conducted group or individual video education with verbal and written material and guidebook.  Patient learns about the Pritikin Eating Plan and its benefit of reducing the risk of disease. The Pritikin Eating Plan does not focus on calories. Instead, it emphasizes high-quality, nutrient-rich foods. By knowing the characteristics of the foods, we choose, we can determine their  calorie density and make  informed decisions.  Targeting Your Nutrition Priorities  Clinical staff conducted group or individual video education with verbal and written material and guidebook.  Patient learns that lifestyle habits have a tremendous impact on disease risk and progression. This video provides eating and physical activity recommendations based on your personal health goals, such as reducing LDL cholesterol, losing weight, preventing or controlling type 2 diabetes, and reducing high blood pressure.  Vitamins and Minerals  Clinical staff conducted group or individual video education with verbal and written material and guidebook.  Patient learns different ways to obtain key vitamins and minerals, including through a recommended healthy diet. It is important to discuss all supplements you take with your doctor.   Healthy Mind-Set    Smoking Cessation  Clinical staff conducted group or individual video education with verbal and written material and guidebook.  Patient learns that cigarette smoking and tobacco addiction pose a serious health risk which affects millions of people. Stopping smoking will significantly reduce the risk of heart disease, lung disease, and many forms of cancer. Recommended strategies for quitting are covered, including working with your doctor to develop a successful plan.  Culinary   Becoming a Set Designer conducted group or individual video education with verbal and written material and guidebook.  Patient learns that cooking at home can be healthy, cost-effective, quick, and puts them in control. Keys to cooking healthy recipes will include looking at your recipe, assessing your equipment needs, planning ahead, making it simple, choosing cost-effective seasonal ingredients, and limiting the use of added fats, salts, and sugars.  Cooking - Breakfast and Snacks  Clinical staff conducted group or individual video education with verbal and  written material and guidebook.  Patient learns how important breakfast is to satiety and nutrition through the entire day. Recommendations include key foods to eat during breakfast to help stabilize blood sugar levels and to prevent overeating at meals later in the day. Planning ahead is also a key component.  Cooking - Educational Psychologist conducted group or individual video education with verbal and written material and guidebook.  Patient learns eating strategies to improve overall health, including an approach to cook more at home. Recommendations include thinking of animal protein as a side on your plate rather than center stage and focusing instead on lower calorie dense options like vegetables, fruits, whole grains, and plant-based proteins, such as beans. Making sauces in large quantities to freeze for later and leaving the skin on your vegetables are also recommended to maximize your experience.  Cooking - Healthy Salads and Dressing Clinical staff conducted group or individual video education with verbal and written material and guidebook.  Patient learns that vegetables, fruits, whole grains, and legumes are the foundations of the Pritikin Eating Plan. Recommendations include how to incorporate each of these in flavorful and healthy salads, and how to create homemade salad dressings. Proper handling of ingredients is also covered. Cooking - Soups and [employer] - Soups and Desserts Clinical staff conducted group or individual video education with verbal and written material and guidebook.  Patient learns that Pritikin soups and desserts make for easy, nutritious, and delicious snacks and meal components that are low in sodium, fat, sugar, and calorie density, while high in vitamins, minerals, and filling fiber. Recommendations include simple and healthy ideas for soups and desserts.   Overview     The Pritikin Solution Program Overview Clinical staff conducted group or  individual video education with verbal and  written material and guidebook.  Patient learns that the results of the Pritikin Program have been documented in more than 100 articles published in peer-reviewed journals, and the benefits include reducing risk factors for (and, in some cases, even reversing) high cholesterol, high blood pressure, type 2 diabetes, obesity, and more! An overview of the three key pillars of the Pritikin Program will be covered: eating well, doing regular exercise, and having a healthy mind-set.  WORKSHOPS  Exercise: Exercise Basics: Building Your Action Plan Clinical staff led group instruction and group discussion with PowerPoint presentation and patient guidebook. To enhance the learning environment the use of posters, models and videos may be added. At the conclusion of this workshop, patients will comprehend the difference between physical activity and exercise, as well as the benefits of incorporating both, into their routine. Patients will understand the FITT (Frequency, Intensity, Time, and Type) principle and how to use it to build an exercise action plan. In addition, safety concerns and other considerations for exercise and cardiac rehab will be addressed by the presenter. The purpose of this lesson is to promote a comprehensive and effective weekly exercise routine in order to improve patients' overall level of fitness.   Managing Heart Disease: Your Path to a Healthier Heart Clinical staff led group instruction and group discussion with PowerPoint presentation and patient guidebook. To enhance the learning environment the use of posters, models and videos may be added.At the conclusion of this workshop, patients will understand the anatomy and physiology of the heart. Additionally, they will understand how Pritikin's three pillars impact the risk factors, the progression, and the management of heart disease.  The purpose of this lesson is to provide a high-level  overview of the heart, heart disease, and how the Pritikin lifestyle positively impacts risk factors.  Exercise Biomechanics Clinical staff led group instruction and group discussion with PowerPoint presentation and patient guidebook. To enhance the learning environment the use of posters, models and videos may be added. Patients will learn how the structural parts of their bodies function and how these functions impact their daily activities, movement, and exercise. Patients will learn how to promote a neutral spine, learn how to manage pain, and identify ways to improve their physical movement in order to promote healthy living. The purpose of this lesson is to expose patients to common physical limitations that impact physical activity. Participants will learn practical ways to adapt and manage aches and pains, and to minimize their effect on regular exercise. Patients will learn how to maintain good posture while sitting, walking, and lifting.  Balance Training and Fall Prevention  Clinical staff led group instruction and group discussion with PowerPoint presentation and patient guidebook. To enhance the learning environment the use of posters, models and videos may be added. At the conclusion of this workshop, patients will understand the importance of their sensorimotor skills (vision, proprioception, and the vestibular system) in maintaining their ability to balance as they age. Patients will apply a variety of balancing exercises that are appropriate for their current level of function. Patients will understand the common causes for poor balance, possible solutions to these problems, and ways to modify their physical environment in order to minimize their fall risk. The purpose of this lesson is to teach patients about the importance of maintaining balance as they age and ways to minimize their risk of falling.  WORKSHOPS   Nutrition:  Fueling a Ship Broker led group  instruction and group discussion with PowerPoint presentation and patient  guidebook. To enhance the learning environment the use of posters, models and videos may be added. Patients will review the foundational principles of the [facility] Eating Plan and understand what constitutes a serving size in each of the food groups. Patients will also learn [facility]-friendly foods that are better choices when away from home and review make-ahead meal and snack options. Calorie density will be reviewed and applied to three nutrition priorities: weight maintenance, weight loss, and weight gain. The purpose of this lesson is to reinforce (in a group setting) the key concepts around what patients are recommended to eat and how to apply these guidelines when away from home by planning and selecting Pritikin-friendly options. Patients will understand how calorie density may be adjusted for different weight management goals.  Mindful Eating  Clinical staff led group instruction and group discussion with PowerPoint presentation and patient guidebook. To enhance the learning environment the use of posters, models and videos may be added. Patients will briefly review the concepts of the [facility] Eating Plan and the importance of low-calorie dense foods. The concept of mindful eating will be introduced as well as the importance of paying attention to internal hunger signals. Triggers for non-hunger eating and techniques for dealing with triggers will be explored. The purpose of this lesson is to provide patients with the opportunity to review the basic principles of the [facility] Eating Plan, discuss the value of eating mindfully and how to measure internal cues of hunger and fullness using the Hunger Scale. Patients will also discuss reasons for non-hunger eating and learn strategies to use for controlling emotional eating.  Targeting Your Nutrition Priorities Clinical staff led group instruction and group discussion with  PowerPoint presentation and patient guidebook. To enhance the learning environment the use of posters, models and videos may be added. Patients will learn how to determine their genetic susceptibility to disease by reviewing their family history. Patients will gain insight into the importance of diet as part of an overall healthy lifestyle in mitigating the impact of genetics and other environmental insults. The purpose of this lesson is to provide patients with the opportunity to assess their personal nutrition priorities by looking at their family history, their own health history and current risk factors. Patients will also be able to discuss ways of prioritizing and modifying the [facility] Eating Plan for their highest risk areas  Menu  Clinical staff led group instruction and group discussion with PowerPoint presentation and patient guidebook. To enhance the learning environment the use of posters, models and videos may be added. Using menus brought in from [employer], or printed from [employer], patients will apply the [facility] dining out guidelines that were presented in the [employer] video. Patients will also be able to practice these guidelines in a variety of provided scenarios. The purpose of this lesson is to provide patients with the opportunity to practice hands-on learning of the [facility] Dining Out guidelines with actual menus and practice scenarios.  Label Reading Clinical staff led group instruction and group discussion with PowerPoint presentation and patient guidebook. To enhance the learning environment the use of posters, models and videos may be added. Patients will review and discuss the [facility] label reading guidelines presented in [employer]'s Label Reading Educational series video. Using fool labels brought in from local grocery stores and markets, patients will apply the label reading guidelines and determine if the packaged food meet the [facility]  guidelines. The purpose of this lesson is to provide patients with the opportunity  to review, discuss, and practice hands-on learning of the [facility] Label Reading guidelines with actual packaged food labels. Cooking School  [facility]'s [employer] are designed to teach patients ways to prepare quick, simple, and affordable recipes at home. The importance of nutrition's role in chronic disease risk reduction is reflected in its emphasis in the overall [facility] program. By learning how to prepare essential core [facility] Eating Plan recipes, patients will increase control over what they eat; be able to customize the flavor of foods without the use of added salt, sugar, or fat; and improve the quality of the food they consume. By learning a set of core recipes which are easily assembled, quickly prepared, and affordable, patients are more likely to prepare more healthy foods at home. These workshops focus on convenient breakfasts, simple entres, side dishes, and desserts which can be prepared with minimal effort and are consistent with nutrition recommendations for cardiovascular risk reduction. Cooking [employer] are taught by a armed forces logistics/support/administrative officer (RD) who has been trained by the [employer]. The chef or RD has a clear understanding of the importance of minimizing - if not completely eliminating - added fat, sugar, and sodium in recipes. Throughout the series of Cooking School Workshop sessions, patients will learn about healthy ingredients and efficient methods of cooking to build confidence in their capability to prepare    Cooking School weekly topics:  Adding Flavor- Sodium-Free  Fast and Healthy Breakfasts  Powerhouse Plant-Based Proteins  Satisfying Salads and Dressings  Simple Sides and Sauces  International Cuisine-Spotlight on the [employer] Zones  Delicious Desserts  Savory Soups  [employer] - Meals in a Astronomer Appetizers and  Snacks  Comforting Weekend Breakfasts  One-Pot Wonders   Fast Evening Meals  Landscape Architect Your Pritikin Plate  WORKSHOPS   Healthy Mindset (Psychosocial):  Focused Goals, Sustainable Changes Clinical staff led group instruction and group discussion with PowerPoint presentation and patient guidebook. To enhance the learning environment the use of posters, models and videos may be added. Patients will be able to apply effective goal setting strategies to establish at least one personal goal, and then take consistent, meaningful action toward that goal. They will learn to identify common barriers to achieving personal goals and develop strategies to overcome them. Patients will also gain an understanding of how our mind-set can impact our ability to achieve goals and the importance of cultivating a positive and growth-oriented mind-set. The purpose of this lesson is to provide patients with a deeper understanding of how to set and achieve personal goals, as well as the tools and strategies needed to overcome common obstacles which may arise along the way.  From Head to Heart: The Power of a Healthy Outlook  Clinical staff led group instruction and group discussion with PowerPoint presentation and patient guidebook. To enhance the learning environment the use of posters, models and videos may be added. Patients will be able to recognize and describe the impact of emotions and mood on physical health. They will discover the importance of self-care and explore self-care practices which may work for them. Patients will also learn how to utilize the 4 C's to cultivate a healthier outlook and better manage stress and challenges. The purpose of this lesson is to demonstrate to patients how a healthy outlook is an essential part of maintaining good health, especially as they continue their cardiac rehab journey.  Healthy Sleep for a Healthy Heart Clinical staff led group instruction and  group discussion with PowerPoint presentation and patient guidebook. To enhance the learning environment the use of posters, models and videos may be added. At the conclusion of this workshop, patients will be able to demonstrate knowledge of the importance of sleep to overall health, well-being, and quality of life. They will understand the symptoms of, and treatments for, common sleep disorders. Patients will also be able to identify daytime and nighttime behaviors which impact sleep, and they will be able to apply these tools to help manage sleep-related challenges. The purpose of this lesson is to provide patients with a general overview of sleep and outline the importance of quality sleep. Patients will learn about a few of the most common sleep disorders. Patients will also be introduced to the concept of "sleep hygiene," and discover ways to self-manage certain sleeping problems through simple daily behavior changes. Finally, the workshop will motivate patients by clarifying the links between quality sleep and their goals of heart-healthy living.   Recognizing and Reducing Stress Clinical staff led group instruction and group discussion with PowerPoint presentation and patient guidebook. To enhance the learning environment the use of posters, models and videos may be added. At the conclusion of this workshop, patients will be able to understand the types of stress reactions, differentiate between acute and chronic stress, and recognize the impact that chronic stress has on their health. They will also be able to apply different coping mechanisms, such as reframing negative self-talk. Patients will have the opportunity to practice a variety of stress management techniques, such as deep abdominal breathing, progressive muscle relaxation, and/or guided imagery.  The purpose of this lesson is to educate patients on the role of stress in their lives and to provide healthy techniques for coping with  it.  Learning Barriers/Preferences:  Learning Barriers/Preferences - 01/19/24 1112       Learning Barriers/Preferences   Learning Barriers None    Learning Preferences Computer/Internet;Individual Instruction;Pictoral;Verbal Instruction;Video;Written Material          Education Topics:  Knowledge Questionnaire Score:  Knowledge Questionnaire Score - 01/19/24 1423       Knowledge Questionnaire Score   Pre Score 22/24          Core Components/Risk Factors/Patient Goals at Admission:  Personal Goals and Risk Factors at Admission - 01/19/24 1216       Core Components/Risk Factors/Patient Goals on Admission   Diabetes Yes    Intervention Provide education about signs/symptoms and action to take for hypo/hyperglycemia.;Provide education about proper nutrition, including hydration, and aerobic/resistive exercise prescription along with prescribed medications to achieve blood glucose in normal ranges: Fasting glucose 65-99 mg/dL    Expected Outcomes Short Term: Participant verbalizes understanding of the signs/symptoms and immediate care of hyper/hypoglycemia, proper foot care and importance of medication, aerobic/resistive exercise and nutrition plan for blood glucose control.;Long Term: Attainment of HbA1C < 7%.    Heart Failure Yes    Intervention Provide a combined exercise and nutrition program that is supplemented with education, support and counseling about heart failure. Directed toward relieving symptoms such as shortness of breath, decreased exercise tolerance, and extremity edema.    Expected Outcomes Short term: Attendance in program 2-3 days a week with increased exercise capacity. Reported lower sodium intake. Reported increased fruit and vegetable intake. Reports medication compliance.;Improve functional capacity of life;Short term: Daily weights obtained and reported for increase. Utilizing diuretic protocols set by physician.;Long term: Adoption of self-care skills and  reduction of barriers for early signs and symptoms recognition and intervention  leading to self-care maintenance.    Hypertension Yes    Intervention Provide education on lifestyle modifcations including regular physical activity/exercise, weight management, moderate sodium restriction and increased consumption of fresh fruit, vegetables, and low fat dairy, alcohol moderation, and smoking cessation.;Monitor prescription use compliance.    Expected Outcomes Short Term: Continued assessment and intervention until BP is < 140/18mm HG in hypertensive participants. < 130/7mm HG in hypertensive participants with diabetes, heart failure or chronic kidney disease.;Long Term: Maintenance of blood pressure at goal levels.    Lipids Yes    Intervention Provide education and support for participant on nutrition & aerobic/resistive exercise along with prescribed medications to achieve LDL 70mg , HDL >40mg .    Expected Outcomes Short Term: Participant states understanding of desired cholesterol values and is compliant with medications prescribed. Participant is following exercise prescription and nutrition guidelines.;Long Term: Cholesterol controlled with medications as prescribed, with individualized exercise RX and with personalized nutrition plan. Value goals: LDL < 70mg , HDL > 40 mg.          Core Components/Risk Factors/Patient Goals Review:   Goals and Risk Factor Review     Row Name 02/09/24 0900 02/23/24 1222 03/23/24 0858         Core Components/Risk Factors/Patient Goals Review   Personal Goals Review Lipids;Heart Failure;Hypertension;Diabetes;Stress Lipids;Heart Failure;Hypertension;Diabetes;Stress Lipids;Heart Failure;Hypertension;Diabetes;Stress     Review Mickael started cardiac rehab on 02/05/24. Arjan is off to a good start to exercise. Vital signs and CBG's have been stable. Leeum has not had any further episodes or symptoms since 01/24/24. Bon has a hypotensive episode on 02/17/24. Jamair's  symptoms improved after water given. Will continue to monitor. Ashlee exercised on 02/19/24 without symptoms Yanky has been doing well with exercise at cardiac rehab. Vital signs  and CBG's have been stable. Nyzier has increased his workloads.     Expected Outcomes Ozro will continue to participate in cardiac rehab for exercise, nutrition and lifestyle modifications Tayquan will continue to participate in cardiac rehab for exercise, nutrition and lifestyle modifications Matas will continue to participate in cardiac rehab for exercise, nutrition and lifestyle modifications        Core Components/Risk Factors/Patient Goals at Discharge (Final Review):   Goals and Risk Factor Review - 03/23/24 0858       Core Components/Risk Factors/Patient Goals Review   Personal Goals Review Lipids;Heart Failure;Hypertension;Diabetes;Stress    Review Nikhil has been doing well with exercise at cardiac rehab. Vital signs  and CBG's have been stable. Yurem has increased his workloads.    Expected Outcomes Iosefa will continue to participate in cardiac rehab for exercise, nutrition and lifestyle modifications          ITP Comments:  ITP Comments     Row Name 01/19/24 1018 01/25/24 1703 02/09/24 9147 03/23/24 0855     ITP Comments Medical Director- Dr. Wilbert Bihari, MD. Introduction to the [facility] Education/ Intensive Cardiac Rehab Program. Initial folder reviewed with Prentice. 30 Day ITP Review. Rosalio is tenatively scheduled to start cardiac rehab on 01/29/24. 30 Day ITP Review. Keeghan started cardiac rehab on 02/05/20. Krystal is off to a good start to exercise. 30 Day ITP Review. Brailyn  has good attendance and participation with exercise at cardiac rehab       Comments: See ITP comments.Hadassah Elpidio Quan RN BSN

## 2024-03-24 LAB — LIPID PANEL
Chol/HDL Ratio: 3.3 ratio (ref 0.0–5.0)
Cholesterol, Total: 136 mg/dL (ref 100–199)
HDL: 41 mg/dL (ref >39)
LDL Chol Calc (NIH): 61 mg/dL (ref 0–99)
Triglycerides: 210 mg/dL — ABNORMAL HIGH (ref 0–149)
VLDL Cholesterol Cal: 34 mg/dL (ref 5–40)

## 2024-03-25 ENCOUNTER — Encounter (HOSPITAL_COMMUNITY)
Admission: RE | Admit: 2024-03-25 | Discharge: 2024-03-25 | Disposition: A | Source: Ambulatory Visit | Attending: Cardiovascular Disease

## 2024-03-25 ENCOUNTER — Ambulatory Visit: Payer: Self-pay | Admitting: Pharmacist

## 2024-03-25 ENCOUNTER — Encounter (HOSPITAL_COMMUNITY)

## 2024-03-25 DIAGNOSIS — I5022 Chronic systolic (congestive) heart failure: Secondary | ICD-10-CM | POA: Diagnosis not present

## 2024-03-25 DIAGNOSIS — Z955 Presence of coronary angioplasty implant and graft: Secondary | ICD-10-CM

## 2024-03-28 ENCOUNTER — Encounter (HOSPITAL_COMMUNITY)

## 2024-03-28 ENCOUNTER — Encounter (HOSPITAL_COMMUNITY)
Admission: RE | Admit: 2024-03-28 | Discharge: 2024-03-28 | Disposition: A | Source: Ambulatory Visit | Attending: Cardiovascular Disease

## 2024-03-28 DIAGNOSIS — I5022 Chronic systolic (congestive) heart failure: Secondary | ICD-10-CM

## 2024-03-28 DIAGNOSIS — Z955 Presence of coronary angioplasty implant and graft: Secondary | ICD-10-CM

## 2024-03-30 ENCOUNTER — Encounter (HOSPITAL_COMMUNITY)
Admission: RE | Admit: 2024-03-30 | Discharge: 2024-03-30 | Disposition: A | Discharge status: Home / Self Care | Source: Ambulatory Visit | Attending: Cardiovascular Disease | Admitting: Cardiovascular Disease

## 2024-03-30 ENCOUNTER — Encounter (HOSPITAL_COMMUNITY)

## 2024-03-30 DIAGNOSIS — I5022 Chronic systolic (congestive) heart failure: Secondary | ICD-10-CM | POA: Diagnosis not present

## 2024-03-30 DIAGNOSIS — Z955 Presence of coronary angioplasty implant and graft: Secondary | ICD-10-CM

## 2024-03-30 LAB — GLUCOSE, CAPILLARY: Glucose-Capillary: 190 mg/dL — ABNORMAL HIGH (ref 70–99)

## 2024-03-30 NOTE — Progress Notes (Signed)
 Complained of feeling lightheaded during cool down stretches. Telemetry rhythm Sinus rate 80's. Manual blood systolic blood  pressure audible in the 70's. Patient was assisted to the chair. Automatic BP recheck 113/64. Patient drank about 16 ounces of water. Reported feeling better. Linton says he is taking his medications as prescribed. CBG 190. Dariel said that he ate oatmeal for breakfast, had pot roast and rice for lunch. Jaykub says he ate a granola bar here at cardiac rehab. Recheck sitting blood pressure 105/62. Standing BP 111/67. Will consult with onsite provider Lum Louis NP. Discussed with onsite provider. No new order's received. Encouraged the patient to make sure he is hydrates adequately and eats properly. Keisuke was instructed to call the office or report to the ED if he has any further symptoms. Patient states understanding and plans to return to exercise on Friday.Hadassah Elpidio Quan RN BSN

## 2024-04-01 ENCOUNTER — Encounter (HOSPITAL_COMMUNITY)
Admission: RE | Admit: 2024-04-01 | Discharge: 2024-04-01 | Disposition: A | Source: Ambulatory Visit | Attending: Cardiovascular Disease

## 2024-04-01 DIAGNOSIS — Z955 Presence of coronary angioplasty implant and graft: Secondary | ICD-10-CM

## 2024-04-01 DIAGNOSIS — I5022 Chronic systolic (congestive) heart failure: Secondary | ICD-10-CM | POA: Diagnosis not present

## 2024-04-04 ENCOUNTER — Encounter (HOSPITAL_COMMUNITY)
Admission: RE | Admit: 2024-04-04 | Discharge: 2024-04-04 | Disposition: A | Discharge status: Home / Self Care | Source: Ambulatory Visit | Attending: Cardiovascular Disease | Admitting: Cardiovascular Disease

## 2024-04-04 DIAGNOSIS — I5022 Chronic systolic (congestive) heart failure: Secondary | ICD-10-CM | POA: Diagnosis not present

## 2024-04-04 DIAGNOSIS — Z955 Presence of coronary angioplasty implant and graft: Secondary | ICD-10-CM

## 2024-04-05 ENCOUNTER — Ambulatory Visit

## 2024-04-05 DIAGNOSIS — I5022 Chronic systolic (congestive) heart failure: Secondary | ICD-10-CM

## 2024-04-06 ENCOUNTER — Encounter (HOSPITAL_COMMUNITY): Admission: RE | Admit: 2024-04-06 | Discharge: 2024-04-06 | Attending: Cardiovascular Disease

## 2024-04-06 ENCOUNTER — Ambulatory Visit: Payer: Self-pay | Admitting: Cardiology

## 2024-04-06 VITALS — Ht 70.0 in | Wt 172.2 lb

## 2024-04-06 DIAGNOSIS — I5022 Chronic systolic (congestive) heart failure: Secondary | ICD-10-CM | POA: Diagnosis not present

## 2024-04-06 DIAGNOSIS — Z955 Presence of coronary angioplasty implant and graft: Secondary | ICD-10-CM

## 2024-04-06 LAB — CUP PACEART REMOTE DEVICE CHECK
Battery Remaining Longevity: 168 mo
Battery Voltage: 3.12 V
Brady Statistic RA Percent Paced: INVALID
Brady Statistic RV Percent Paced: 0 %
Date Time Interrogation Session: 20251215230845
HighPow Impedance: 63 Ohm
Implantable Lead Connection Status: 753985
Implantable Lead Implant Date: 20250801
Implantable Lead Location: 753860
Implantable Pulse Generator Implant Date: 20250801
Lead Channel Impedance Value: 437 Ohm
Lead Channel Impedance Value: 608 Ohm
Lead Channel Pacing Threshold Amplitude: 1 V
Lead Channel Pacing Threshold Pulse Width: 0.4 ms
Lead Channel Sensing Intrinsic Amplitude: 14.5 mV
Lead Channel Setting Pacing Amplitude: 2 V
Lead Channel Setting Pacing Pulse Width: 0.4 ms
Lead Channel Setting Sensing Sensitivity: 0.3 mV

## 2024-04-06 NOTE — Progress Notes (Signed)
 Remote ICD Transmission

## 2024-04-08 ENCOUNTER — Encounter (HOSPITAL_COMMUNITY): Admission: RE | Admit: 2024-04-08 | Discharge: 2024-04-08 | Attending: Cardiovascular Disease

## 2024-04-08 DIAGNOSIS — Z955 Presence of coronary angioplasty implant and graft: Secondary | ICD-10-CM

## 2024-04-08 DIAGNOSIS — I5022 Chronic systolic (congestive) heart failure: Secondary | ICD-10-CM

## 2024-04-11 ENCOUNTER — Encounter (HOSPITAL_COMMUNITY)

## 2024-04-11 DIAGNOSIS — I5022 Chronic systolic (congestive) heart failure: Secondary | ICD-10-CM

## 2024-04-11 DIAGNOSIS — Z955 Presence of coronary angioplasty implant and graft: Secondary | ICD-10-CM

## 2024-04-13 ENCOUNTER — Encounter (HOSPITAL_COMMUNITY)
Admission: RE | Admit: 2024-04-13 | Discharge: 2024-04-13 | Disposition: A | Source: Ambulatory Visit | Attending: Cardiovascular Disease

## 2024-04-13 DIAGNOSIS — I5022 Chronic systolic (congestive) heart failure: Secondary | ICD-10-CM | POA: Diagnosis not present

## 2024-04-13 DIAGNOSIS — Z955 Presence of coronary angioplasty implant and graft: Secondary | ICD-10-CM

## 2024-04-13 NOTE — Progress Notes (Signed)
 Discharge Progress Report  Patient Details  Name: [facility]. MRN: [id number] Date of Birth: 1950/09/09 Referring Provider:   Flowsheet Row INTENSIVE CARDIAC REHAB ORIENT from 01/19/2024 in [facility]  Referring Provider Verlin Lonni BIRCH, MD     Number of Visits: 41  Reason for Discharge:  Patient reached a stable level of exercise. Patient independent in their exercise. Patient has met program and personal goals.  Smoking History:  Tobacco Use History[1]  Diagnosis:  6/30/ 25 Heart failure, chronic systolic  10/22/23 PTCA/DES LAD; CTO LCx  ADL UCSD:   Initial Exercise Prescription:  Initial Exercise Prescription - 01/19/24 1400       Date of Initial Exercise RX and Referring Provider   Date 01/19/24    Referring Provider Verlin Lonni BIRCH, MD    Expected Discharge Date 04/13/24      Bike   Level 1    Watts 30    Minutes 15    METs 2.5      Recumbant Elliptical   Level 1    RPM 25    Watts 30    Minutes 15    METs 2.5      Prescription Details   Frequency (times per week) 3    Duration Progress to 30 minutes of continuous aerobic without signs/symptoms of physical distress      Intensity   THRR 40-80% of Max Heartrate 60-119    Ratings of Perceived Exertion 11-13    Perceived Dyspnea 0-4      Progression   Progression Continue to progress workloads to maintain intensity without signs/symptoms of physical distress.      Resistance Training   Training Prescription Yes    Weight 3 lbs    Reps 10-15          Discharge Exercise Prescription (Final Exercise Prescription Changes):  Exercise Prescription Changes - 04/13/24 1600       Response to Exercise   Blood Pressure (Admit) 104/68    Blood Pressure (Exit) 120/60    Heart Rate (Admit) 85 bpm    Heart Rate (Exercise) 112 bpm    Heart Rate (Exit) 94 bpm    Rating of Perceived Exertion (Exercise) 9    Perceived  Dyspnea (Exercise) 0    Symptoms None    Comments Pt graduated form the CRP2 program today    Duration Continue with 30 min of aerobic exercise without signs/symptoms of physical distress.    Intensity THRR unchanged      Progression   Progression Continue to progress workloads to maintain intensity without signs/symptoms of physical distress.    Average METs 4.2      Resistance Training   Weight No wts on wednesdays      Interval Training   Interval Training No      Bike   Level 3    Watts 39    Minutes 15    METs 3.5      Recumbant Elliptical   Level 2    RPM 69    Watts 101    Minutes 15    METs 4.9      Home Exercise Plan   Plans to continue exercise at [employer] (comment)    Frequency Add 2 additional days to program exercise sessions.    Initial Home Exercises Provided 02/23/24          Functional Capacity:  6 Minute Walk  Row Name 01/19/24 1243 04/04/24 1630       6 Minute Walk   Phase Initial Discharge    Distance 1530 feet 1800 feet    Distance % Change -- 17.65 %    Distance Feet Change -- 270 ft    Walk Time 6 minutes 6 minutes    # of Rest Breaks 0 0    MPH 2.9 3.41    METS 3.61 4.07    RPE 11 7    Perceived Dyspnea  0 0    VO2 Peak 12.65 14.25    Symptoms Yes (comment) Yes (comment)    Comments Right hip pain 1/10 on pain scale, resolved after second lap. Tightness in calf    Resting HR 74 bpm 63 bpm    Resting BP 124/60 95/52    Resting Oxygen Saturation  95 % --    Exercise Oxygen Saturation  during 6 min walk 96 % --    Max Ex. HR 100 bpm 109 bpm    Max Ex. BP 148/64 136/70    2 Minute Post BP 150/64 --       Psychological, QOL, Others - Outcomes: PHQ 2/9:    04/13/2024    1:32 PM 01/19/2024    2:17 PM 03/16/2018   10:24 AM 02/03/2018   10:38 AM 12/17/2017   10:34 AM  Depression screen PHQ 2/9  Decreased Interest 0 0 0 0 0  Down, Depressed, Hopeless 0 0 0 0 0  PHQ - 2 Score 0 0 0 0 0  Altered sleeping 0 1 0 0 0   Tired, decreased energy 0 1 0 0 0  Change in appetite 0 1 0 0 0  Feeling bad or failure about yourself  0 0 0 0 0  Trouble concentrating 0 0 0 0 0  Moving slowly or fidgety/restless 0 0 0 0 0  Suicidal thoughts 0 0 0 0 0  PHQ-9 Score 0 3  0  0  0   Difficult doing work/chores Not difficult at all Not difficult at all Not difficult at all Not difficult at all Not difficult at all     Data saved with a previous flowsheet row definition    Quality of Life:  Quality of Life - 04/08/24 1708       Quality of Life Scores   Health/Function Post 29.6 %    Socioeconomic Post 29.14 %    Psych/Spiritual Post 30 %    Family Post 30 %    GLOBAL Post 29.65 %          Personal Goals: Goals established at orientation with interventions provided to work toward goal.  Personal Goals and Risk Factors at Admission - 01/19/24 1216       Core Components/Risk Factors/Patient Goals on Admission   Diabetes Yes    Intervention Provide education about signs/symptoms and action to take for hypo/hyperglycemia.;Provide education about proper nutrition, including hydration, and aerobic/resistive exercise prescription along with prescribed medications to achieve blood glucose in normal ranges: Fasting glucose 65-99 mg/dL    Expected Outcomes Short Term: Participant verbalizes understanding of the signs/symptoms and immediate care of hyper/hypoglycemia, proper foot care and importance of medication, aerobic/resistive exercise and nutrition plan for blood glucose control.;Long Term: Attainment of HbA1C < 7%.    Heart Failure Yes    Intervention Provide a combined exercise and nutrition program that is supplemented with education, support and counseling about heart failure. Directed toward relieving symptoms such as shortness of  breath, decreased exercise tolerance, and extremity edema.    Expected Outcomes Short term: Attendance in program 2-3 days a week with increased exercise capacity. Reported lower sodium  intake. Reported increased fruit and vegetable intake. Reports medication compliance.;Improve functional capacity of life;Short term: Daily weights obtained and reported for increase. Utilizing diuretic protocols set by physician.;Long term: Adoption of self-care skills and reduction of barriers for early signs and symptoms recognition and intervention leading to self-care maintenance.    Hypertension Yes    Intervention Provide education on lifestyle modifcations including regular physical activity/exercise, weight management, moderate sodium restriction and increased consumption of fresh fruit, vegetables, and low fat dairy, alcohol moderation, and smoking cessation.;Monitor prescription use compliance.    Expected Outcomes Short Term: Continued assessment and intervention until BP is < 140/50mm HG in hypertensive participants. < 130/51mm HG in hypertensive participants with diabetes, heart failure or chronic kidney disease.;Long Term: Maintenance of blood pressure at goal levels.    Lipids Yes    Intervention Provide education and support for participant on nutrition & aerobic/resistive exercise along with prescribed medications to achieve LDL 70mg , HDL >40mg .    Expected Outcomes Short Term: Participant states understanding of desired cholesterol values and is compliant with medications prescribed. Participant is following exercise prescription and nutrition guidelines.;Long Term: Cholesterol controlled with medications as prescribed, with individualized exercise RX and with personalized nutrition plan. Value goals: LDL < 70mg , HDL > 40 mg.           Personal Goals Discharge:  Goals and Risk Factor Review     Row Name 02/09/24 0900 02/23/24 1222 03/23/24 0858 04/22/24 1318       Core Components/Risk Factors/Patient Goals Review   Personal Goals Review Lipids;Heart Failure;Hypertension;Diabetes;Stress Lipids;Heart Failure;Hypertension;Diabetes;Stress Lipids;Heart  Failure;Hypertension;Diabetes;Stress Lipids;Heart Failure;Hypertension;Diabetes;Stress    Review Jonathan [facility] started cardiac rehab on 02/05/24. Jonathan [facility]antes is off to a good start to exercise. Vital signs and CBG's have been stable. Jonathan [facility] has not had any further episodes or symptoms since 01/24/24. [facility] has a hypotensive episode on 02/17/24. Jonathan [facility]'s symptoms improved after water given. Will continue to monitor. [facility] exercised on 02/19/24 without symptoms Jonathan [facility] has been doing well with exercise at cardiac rehab. Vital signs  and CBG's have been stable. [facility] has increased his workloads. [facility] did well with exercise at cardiac rehab. Vital signs  and CBG's were stable. Jonathan [facility] completed cardiac rehab on 04/13/24    Expected Outcomes Jonathan [facility] will continue to participate in cardiac rehab for exercise, nutrition and lifestyle modifications [facility] will continue to participate in cardiac rehab for exercise, nutrition and lifestyle modifications [facility] will continue to participate in cardiac rehab for exercise, nutrition and lifestyle modifications [facility] will continue to exercise,  follow nutrition and lifestyle modifications upon completion of cardiac rehab.       Exercise Goals and Review:  Exercise Goals     Row Name 01/19/24 1226             Exercise Goals   Increase Physical Activity Yes       Intervention Provide advice, education, support and counseling about physical activity/exercise needs.;Develop an individualized exercise prescription for aerobic and resistive training based on initial evaluation findings, risk stratification, comorbidities and participant's personal goals.       Expected Outcomes Short Term: Attend rehab on a regular basis to increase amount of physical activity.;Long Term: Exercising regularly at least 3-5 days a week.;Long Term: Add in home exercise to make exercise part of routine and to increase amount of physical activity.  Increase Strength and Stamina Yes       Intervention Provide advice,  education, support and counseling about physical activity/exercise needs.;Develop an individualized exercise prescription for aerobic and resistive training based on initial evaluation findings, risk stratification, comorbidities and participant's personal goals.       Expected Outcomes Short Term: Increase workloads from initial exercise prescription for resistance, speed, and METs.;Short Term: Perform resistance training exercises routinely during rehab and add in resistance training at home;Long Term: Improve cardiorespiratory fitness, muscular endurance and strength as measured by increased METs and functional capacity ( )       Able to understand and use rate of perceived exertion (RPE) scale Yes       Intervention Provide education and explanation on how to use RPE scale       Expected Outcomes Short Term: Able to use RPE daily in rehab to express subjective intensity level;Long Term:  Able to use RPE to guide intensity level when exercising independently       Knowledge and understanding of Target Heart Rate Range (THRR) Yes       Intervention Provide education and explanation of THRR including how the numbers were predicted and where they are located for reference       Expected Outcomes Short Term: Able to state/look up THRR;Long Term: Able to use THRR to govern intensity when exercising independently;Short Term: Able to use daily as guideline for intensity in rehab       Able to check pulse independently Yes       Intervention Provide education and demonstration on how to check pulse in carotid and radial arteries.;Review the importance of being able to check your own pulse for safety during independent exercise       Expected Outcomes Short Term: Able to explain why pulse checking is important during independent exercise;Long Term: Able to check pulse independently and accurately       Understanding of Exercise Prescription Yes       Intervention Provide education, explanation, and written  materials on patient's individual exercise prescription       Expected Outcomes Short Term: Able to explain program exercise prescription;Long Term: Able to explain home exercise prescription to exercise independently          Exercise Goals Re-Evaluation:  Exercise Goals Re-Evaluation     Row Name 02/05/24 1708 02/24/24 1633 03/23/24 1702 04/13/24 1623       Exercise Goal Re-Evaluation   Exercise Goals Review Increase Physical Activity;Understanding of Exercise Prescription;Increase Strength and Stamina;Knowledge and understanding of Target Heart Rate Range (THRR);Able to understand and use rate of perceived exertion (RPE) scale Increase Physical Activity;Understanding of Exercise Prescription;Increase Strength and Stamina;Knowledge and understanding of Target Heart Rate Range (THRR);Able to understand and use rate of perceived exertion (RPE) scale Increase Physical Activity;Understanding of Exercise Prescription;Increase Strength and Stamina;Knowledge and understanding of Target Heart Rate Range (THRR);Able to understand and use rate of perceived exertion (RPE) scale Increase Physical Activity;Understanding of Exercise Prescription;Increase Strength and Stamina;Knowledge and understanding of Target Heart Rate Range (THRR);Able to understand and use rate of perceived exertion (RPE) scale    Comments Pt first day in the [facility]. Pt tolerated exercise well with an average MET level of 2.9. He is off to a good start and is learning his THRR, RPE and ExRx Reviewed MET's, goals and home ExRx. Pt tolerated exercise well with an average MET level of 3.0. He feels good with exercise and his only goal was to find a way  to track and monitor his progress on his own. I was able to create him a template and printed it out for him. Also gave him a copy I already had made for him to take to the [facility] for knowing what to set the [facility]/seat arm/etc for progression. He plans to exercise on his own by walking  and going to [facility] 1-2 days for 30-45 mins Reviewed MET's and goals. Pt tolerated exercise well with an average MET level of 4.4. He is doing well and feels good with exxercise. He is just coming back from holiday travel, but is continuing to increase MET's. He says as the weather changes he is having more issues with his bones hurting, especially his hip. Pt graduated from the CRP2 program today. Pt voices he has made good progress in the program and feels better. Pt's plan is to exercise at the [facility]). He plans to walk and focus on core exercises. His plan is to exercise 30-60 minutes/ 3-5 x/week.    Expected Outcomes Will continue to monitor pt and progress workloads as tolerated without sign or symptom Will continue to monitor pt and progress workloads as tolerated without sign or symptom Will continue to monitor pt and progress workloads as tolerated without sign or symptom Pt will contine his exercise at the [facility]       Nutrition & Weight - Outcomes:  Pre Biometrics - 01/19/24 1018       Pre Biometrics   Waist Circumference 40 inches    Hip Circumference 39.75 inches    Waist to Hip Ratio 1.01 %    Triceps Skinfold 15 mm    % Body Fat 26.3 %    Grip Strength 27 kg    Flexibility 10.25 in    Single Leg Stand 10.06 seconds          Post Biometrics - 04/06/24 1800        Post  Biometrics   Height 5' 10 (1.778 m)    Weight 78.1 kg    Waist Circumference 38 inches    Hip Circumference 39.5 inches    Waist to Hip Ratio 0.96 %    BMI (Calculated) 24.71    Triceps Skinfold 15 mm    % Body Fat 25.5 %    Grip Strength 30 kg    Flexibility 12.75 in    Single Leg Stand 9 seconds          Nutrition:   Nutrition Discharge:  Nutrition Assessments - 01/25/24 0724       Rate Your Plate Scores   Pre Score 69          Education Questionnaire Score:  Knowledge Questionnaire Score - 04/08/24 1704       Knowledge Questionnaire Score   Post Score 24/24           Goals reviewed with patient; copy given to patient.Pt graduates from  Intensive/Traditional cardiac rehab program on 04/13/24 with completion of  53 exercise and education sessions. Pt maintained good attendance and progressed nicely during their participation in rehab as evidenced by increased MET level.   Medication list reconciled. Repeat  PHQ score- 0 .  Pt has made significant lifestyle changes and should be commended for his success. Norval achieved his goals during cardiac rehab.   Pt plans to continue exercise at the [facility] 3-5 days a week and walking. Anikin increased his distance on his post exercise walk test by 270 feet. Aldin says  that participating in cardiac rehab has been helpful for him. We are proud of Avik's progress!Hadassah Elpidio Quan RN BSN      [1]  Social History Tobacco Use  Smoking Status Never  Smokeless Tobacco Never

## 2024-04-25 ENCOUNTER — Telehealth: Payer: Self-pay | Admitting: Cardiology

## 2024-04-25 NOTE — Telephone Encounter (Signed)
" °*  STAT* If patient is at the pharmacy, call can be transferred to refill team.   1. Which medications need to be refilled? (please list name of each medication and dose if known) amiodarone  (PACERONE ) 200 MG tablet    2. Would you like to learn more about the convenience, safety, & potential cost savings by using the [facility]?    3. Are you open to using the [facility] (Type [facility].    4. Which pharmacy/location (including street and city if local pharmacy) is medication to be sent to? [id number] - [place], [place] - [address]     5. Do they need a 30 day or 90 day supply? 90   PT is out of MEDS and is going out of town tomorrow 04/26/24  "

## 2024-04-26 ENCOUNTER — Other Ambulatory Visit: Payer: Self-pay

## 2024-04-26 MED ORDER — AMIODARONE HCL 200 MG PO TABS: 200.0000 mg | ORAL_TABLET | Freq: Every day | ORAL | 0 refills | Status: AC

## 2024-04-26 MED ORDER — AMIODARONE HCL 200 MG PO TABS: ORAL_TABLET | ORAL | 0 refills | Status: DC

## 2024-04-26 NOTE — Telephone Encounter (Signed)
 Attempted pt several times, goes straight to voicemail.  Left pt detailed message that he should be taking Amiodarone  200 mg once daily.  Informed that I would send in 90 day supply to the pharmacy as requested.  Advised to call office tomorrow if needed to discuss this further/questions.

## 2024-07-05 ENCOUNTER — Encounter

## 2024-10-04 ENCOUNTER — Encounter

## 2025-01-03 ENCOUNTER — Encounter
